# Patient Record
Sex: Male | Born: 1965 | Race: White | Hispanic: No | Marital: Married | Smoking: Former smoker
Health system: Southern US, Community
[De-identification: ages and names within clinical notes are randomized; demographics above are authoritative.]

## PROBLEM LIST (undated history)

## (undated) DIAGNOSIS — K920 Hematemesis: Secondary | ICD-10-CM

## (undated) DIAGNOSIS — K219 Gastro-esophageal reflux disease without esophagitis: Secondary | ICD-10-CM

## (undated) DIAGNOSIS — R51 Headache: Secondary | ICD-10-CM

## (undated) DIAGNOSIS — G610 Guillain-Barre syndrome: Secondary | ICD-10-CM

## (undated) DIAGNOSIS — I509 Heart failure, unspecified: Secondary | ICD-10-CM

## (undated) DIAGNOSIS — Z95810 Presence of automatic (implantable) cardiac defibrillator: Secondary | ICD-10-CM

## (undated) DIAGNOSIS — K311 Adult hypertrophic pyloric stenosis: Secondary | ICD-10-CM

## (undated) DIAGNOSIS — K259 Gastric ulcer, unspecified as acute or chronic, without hemorrhage or perforation: Secondary | ICD-10-CM

## (undated) DIAGNOSIS — Z87442 Personal history of urinary calculi: Secondary | ICD-10-CM

## (undated) DIAGNOSIS — F419 Anxiety disorder, unspecified: Secondary | ICD-10-CM

## (undated) DIAGNOSIS — K922 Gastrointestinal hemorrhage, unspecified: Secondary | ICD-10-CM

## (undated) HISTORY — DX: Hematemesis: K92.0

## (undated) HISTORY — DX: Adult hypertrophic pyloric stenosis: K31.1

## (undated) HISTORY — PX: OTHER SURGICAL HISTORY: SHX169

## (undated) HISTORY — PX: TONSILLECTOMY: SUR1361

## (undated) HISTORY — DX: Gastrointestinal hemorrhage, unspecified: K92.2

---
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
## 2010-07-04 ENCOUNTER — Emergency Department (HOSPITAL_COMMUNITY)
Admission: EM | Admit: 2010-07-04 | Discharge: 2010-07-04 | Disposition: A | Discharge status: Home / Self Care | Payer: BC Managed Care – PPO | Attending: Emergency Medicine | Admitting: Emergency Medicine

## 2010-07-04 ENCOUNTER — Emergency Department (HOSPITAL_COMMUNITY): Payer: BC Managed Care – PPO

## 2010-07-04 ENCOUNTER — Other Ambulatory Visit: Payer: Self-pay | Admitting: Family Medicine

## 2010-07-04 ENCOUNTER — Ambulatory Visit
Admission: RE | Admit: 2010-07-04 | Discharge: 2010-07-04 | Disposition: A | Discharge status: Home / Self Care | Payer: BC Managed Care – PPO | Source: Ambulatory Visit | Attending: Family Medicine | Admitting: Family Medicine

## 2010-07-04 DIAGNOSIS — R51 Headache: Secondary | ICD-10-CM

## 2010-07-04 DIAGNOSIS — G44209 Tension-type headache, unspecified, not intractable: Secondary | ICD-10-CM | POA: Insufficient documentation

## 2010-07-04 DIAGNOSIS — R110 Nausea: Secondary | ICD-10-CM | POA: Insufficient documentation

## 2010-07-04 LAB — COMPREHENSIVE METABOLIC PANEL
AST: 21 U/L (ref 0–37)
Albumin: 3.9 g/dL (ref 3.5–5.2)
Chloride: 104 mEq/L (ref 96–112)
Creatinine, Ser: 0.79 mg/dL (ref 0.4–1.5)
GFR calc Af Amer: >60 mL/min (ref >60)
Potassium: 3.8 mEq/L (ref 3.5–5.1)
Sodium: 140 mEq/L (ref 135–145)
Total Bilirubin: 0.1 mg/dL — ABNORMAL LOW (ref 0.3–1.2)

## 2010-07-04 LAB — URINALYSIS, ROUTINE W REFLEX MICROSCOPIC
Glucose, UA: NEGATIVE mg/dL
Protein, ur: NEGATIVE mg/dL
Specific Gravity, Urine: 1.031 — ABNORMAL HIGH (ref 1.005–1.030)
pH: 5.5 (ref 5.0–8.0)

## 2010-07-04 LAB — CBC
HCT: 46.1 % (ref 39.0–52.0)
Hemoglobin: 15.8 g/dL (ref 13.0–17.0)
MCH: 32.8 pg (ref 26.0–34.0)
MCHC: 34.3 g/dL (ref 30.0–36.0)

## 2010-07-04 LAB — DIFFERENTIAL
Lymphocytes Relative: 8 % — ABNORMAL LOW (ref 12–46)
Monocytes Absolute: 0.6 10*3/uL (ref 0.1–1.0)
Monocytes Relative: 5 % (ref 3–12)
Neutro Abs: 10.5 10*3/uL — ABNORMAL HIGH (ref 1.7–7.7)

## 2010-07-04 MED ORDER — GADOBENATE DIMEGLUMINE 529 MG/ML IV SOLN: 17.0000 mL | Freq: Once | INTRAVENOUS | Status: DC | PRN

## 2012-09-11 ENCOUNTER — Ambulatory Visit (INDEPENDENT_AMBULATORY_CARE_PROVIDER_SITE_OTHER): Payer: BC Managed Care – PPO | Admitting: Family Medicine

## 2012-09-11 ENCOUNTER — Ambulatory Visit: Payer: BC Managed Care – PPO

## 2012-09-11 VITALS — BP 140/80 | HR 110 | Temp 98.3°F | Resp 16 | Ht 71.0 in | Wt 169.6 lb

## 2012-09-11 DIAGNOSIS — R319 Hematuria, unspecified: Secondary | ICD-10-CM

## 2012-09-11 DIAGNOSIS — M545 Low back pain: Secondary | ICD-10-CM

## 2012-09-11 LAB — POCT URINALYSIS DIPSTICK
Protein, UA: NEGATIVE
Spec Grav, UA: 1.02
Urobilinogen, UA: 0.2

## 2012-09-11 LAB — POCT UA - MICROSCOPIC ONLY
Casts, Ur, LPF, POC: NEGATIVE
Crystals, Ur, HPF, POC: NEGATIVE

## 2012-09-11 LAB — POCT CBC
Granulocyte percent: 83.7 %G — AB (ref 37–80)
Hemoglobin: 15.2 g/dL (ref 14.1–18.1)
MPV: 8.4 fL (ref 0–99.8)
POC Granulocyte: 12.6 — AB (ref 2–6.9)
POC MID %: 83.7 %M — AB (ref 0–12)
RBC: 4.66 M/uL — AB (ref 4.69–6.13)

## 2012-09-11 MED ORDER — KETOROLAC TROMETHAMINE 60 MG/2ML IM SOLN
60.0000 mg | Freq: Once | INTRAMUSCULAR | Status: AC
  Administered 2012-09-11: 60 mg via INTRAMUSCULAR

## 2012-09-11 MED ORDER — HYDROCODONE-ACETAMINOPHEN 5-325 MG PO TABS: 1.0000 | ORAL_TABLET | Freq: Four times a day (QID) | ORAL | Status: DC | PRN

## 2012-09-11 MED ORDER — IBUPROFEN 800 MG PO TABS: 800.0000 mg | ORAL_TABLET | Freq: Three times a day (TID) | ORAL | Status: DC | PRN

## 2012-09-11 MED ORDER — TAMSULOSIN HCL 0.4 MG PO CAPS: 0.4000 mg | ORAL_CAPSULE | Freq: Every day | ORAL | Status: DC

## 2012-09-11 NOTE — Patient Instructions (Addendum)

## 2012-09-11 NOTE — Progress Notes (Signed)
[address]   [place], [place]  [address]   [phone]  Subjective:    [name], male    DOB: 1966/01/07, 47 y.o.   MRN: [id number]  HPI This 47 y.o. male presents for evaluation of L lower back pain.  Onset of vomiting two days prior; felt better but then L lower back pain developed; pain has moved to L side.  Today pain is quite severe and unable to concentrate.  Pain comes in waves. Low end 4-9/10.  Intermittent pain.  With urination, felt relief and then pain exacerbated.  No dysuria; decreased UOP.  No straining with urination; urine stream is slow.  No hematuria.  No kidney stone.  Wife is nurse; wife has kidney stones.  No fever; working all week. Did look pale today when got home today.  Also having abdominal cramping; no bowel movement yesterday; wife gave him Mag Citrate and Gas-X; stomach pain improved.  Then developed L sided pain.  No vomiting in four days.  +nausea intermittent.  Can't get comfortable no matter what position.  Has taken Ibuprofen this afternoon. Appetite decreased.  Transport planner.    PCP:  [facility]   Review of Systems  Constitutional: Negative for fever, chills, diaphoresis and fatigue.  Gastrointestinal: Positive for nausea, vomiting and abdominal pain. Negative for diarrhea and constipation.  Genitourinary: Positive for flank pain and decreased urine volume. Negative for dysuria, urgency, frequency, hematuria, discharge, penile swelling, scrotal swelling, genital sores, penile pain and testicular pain.  Musculoskeletal: Positive for back pain.  Skin: Negative for rash.  Neurological: Negative for weakness and numbness.    History reviewed. No pertinent past medical history.  History reviewed. No pertinent past surgical history.  Prior to Admission medications   Medication Sig Start Date End Date Taking? Authorizing Provider  aspirin 325 MG tablet Take 325 mg by mouth daily.   Yes Historical Provider, MD  omeprazole (PRILOSEC) 10 MG  capsule Take 10 mg by mouth daily.   Yes Historical Provider, MD  HYDROcodone-acetaminophen (NORCO/VICODIN) 5-325 MG per tablet Take 1 tablet by mouth every 6 (six) hours as needed for pain. 09/11/12   Ethelda Chick, MD  tamsulosin (FLOMAX) 0.4 MG CAPS Take 1 capsule (0.4 mg total) by mouth daily. 09/11/12   Ethelda Chick, MD    No Known Allergies  History   Social History  . Marital Status: Single    Spouse Name: N/A    Number of Children: N/A  . Years of Education: N/A   Occupational History  . Not on file.   Social History Main Topics  . Smoking status: Current Every Day Smoker  . Smokeless tobacco: Not on file  . Alcohol Use: Not on file  . Drug Use: Not on file  . Sexually Active: Not on file   Other Topics Concern  . Not on file   Social History Narrative  . No narrative on file    No family history on file.     Objective:   Physical Exam  Nursing note and vitals reviewed. Constitutional: He is oriented to person, place, and time. He appears well-developed and well-nourished. He appears distressed.  Secondary to pain; pain resolved during exam.  HENT:  Head: Normocephalic and atraumatic.  Neck: Normal range of motion. Neck supple.  Cardiovascular: Normal rate, regular rhythm and normal heart sounds.   Pulmonary/Chest: Effort normal and breath sounds normal.  Abdominal: Soft. Bowel sounds are normal. He exhibits no distension and no mass.  There is no tenderness. There is no rebound and no guarding.  Neurological: He is alert and oriented to person, place, and time.  Skin: Skin is warm and dry. No rash noted. He is not diaphoretic.  Psychiatric: He has a normal mood and affect. His behavior is normal.      Results for orders placed in visit on 09/11/12  POCT UA - MICROSCOPIC ONLY      Result Value Range   WBC, Ur, HPF, POC 0-3     RBC, urine, microscopic 3-8     Bacteria, U Microscopic trace     Mucus, UA trace     Epithelial cells, urine per micros 0-1      Crystals, Ur, HPF, POC neg     Casts, Ur, LPF, POC neg     Yeast, UA neg    POCT URINALYSIS DIPSTICK      Result Value Range   Color, UA yellow     Clarity, UA clear     Glucose, UA neg     Bilirubin, UA neg     Ketones, UA 40     Spec Grav, UA 1.020     Blood, UA mod     pH, UA 5.5     Protein, UA neg     Urobilinogen, UA 0.2     Nitrite, UA neg     Leukocytes, UA Negative     TORADOL 60MG  IM ADMINISTERED IN OFFICE BY RODIE.  [facility] reading (PRIMARY) by  Dr. Katrinka Blazing.  KUB:  +calcifications R abdomen; ? Calcification L kidney     Assessment & Plan:  Low back pain - Plan: POCT UA - Microscopic Only, POCT urinalysis dipstick, ketorolac (TORADOL) injection 60 mg  Hematuria - Plan: POCT CBC, Comprehensive metabolic panel, Urine culture, DG Abd 1 View  1.  Low back pain/Flank pain:  New. Associated with hematuria; concern for calcified stone on KUB: treat for nephrolithiasis; s/p Toradol injection in office. Rx for Ibuprofen 800mg  tid with food; rx for hydrocodone provided. 2. Hematuria:  New. Associated with severe colicky back pain/flank pain. Treat for acute nephrolithiasis with Ibuprofen 800mg , Hydrocodone, Flomax. Refer for CT abd/pelvis in am to evaluate location of stone.  Aggressive hydration emphasized. 3. Likely acute nephrolithiasis:  New.  Strainer provided; rx for Ibuprofen, Hydrocodone, Flomax.  Obtain CT.  Meds ordered this encounter  Medications  . aspirin 325 MG tablet    Sig: Take 325 mg by mouth daily.  Marland Kitchen omeprazole (PRILOSEC) 10 MG capsule    Sig: Take 10 mg by mouth daily.  Marland Kitchen ketorolac (TORADOL) injection 60 mg    Sig:   . tamsulosin (FLOMAX) 0.4 MG CAPS    Sig: Take 1 capsule (0.4 mg total) by mouth daily.    Dispense:  30 capsule    Refill:  0  . HYDROcodone-acetaminophen (NORCO/VICODIN) 5-325 MG per tablet    Sig: Take 1 tablet by mouth every 6 (six) hours as needed for pain.    Dispense:  40 tablet    Refill:  0

## 2012-09-12 ENCOUNTER — Ambulatory Visit (HOSPITAL_COMMUNITY)
Admission: RE | Admit: 2012-09-12 | Discharge: 2012-09-12 | Disposition: A | Discharge status: Home / Self Care | Payer: BC Managed Care – PPO | Source: Ambulatory Visit | Attending: Family Medicine | Admitting: Family Medicine

## 2012-09-12 ENCOUNTER — Telehealth: Payer: Self-pay | Admitting: Radiology

## 2012-09-12 DIAGNOSIS — R112 Nausea with vomiting, unspecified: Secondary | ICD-10-CM | POA: Insufficient documentation

## 2012-09-12 DIAGNOSIS — N201 Calculus of ureter: Secondary | ICD-10-CM | POA: Insufficient documentation

## 2012-09-12 DIAGNOSIS — N200 Calculus of kidney: Secondary | ICD-10-CM

## 2012-09-12 DIAGNOSIS — R319 Hematuria, unspecified: Secondary | ICD-10-CM | POA: Insufficient documentation

## 2012-09-12 DIAGNOSIS — R109 Unspecified abdominal pain: Secondary | ICD-10-CM | POA: Insufficient documentation

## 2012-09-12 DIAGNOSIS — N1330 Unspecified hydronephrosis: Secondary | ICD-10-CM | POA: Insufficient documentation

## 2012-09-12 LAB — COMPREHENSIVE METABOLIC PANEL
ALT: 13 U/L (ref 0–53)
CO2: 23 mEq/L (ref 19–32)
Calcium: 9.6 mg/dL (ref 8.4–10.5)
Chloride: 101 mEq/L (ref 96–112)
Sodium: 138 mEq/L (ref 135–145)
Total Protein: 7 g/dL (ref 6.0–8.3)

## 2012-09-12 NOTE — Telephone Encounter (Signed)
Records have been sent to [facility], the triage nurse will call me back with an appt.

## 2012-09-12 NOTE — Telephone Encounter (Signed)
Noted  

## 2012-09-12 NOTE — Telephone Encounter (Signed)
Patient advised, we will be in touch with him with appt for Urology.

## 2012-09-12 NOTE — Telephone Encounter (Signed)
Patient has a kidney stone demonstrated on the CT scan, please advise.

## 2012-09-12 NOTE — Telephone Encounter (Signed)
Discussed with Dr Katrinka Blazing Urology referral made STAT

## 2012-09-12 NOTE — Telephone Encounter (Signed)
[facility] called to report that they fit pt in for appt today and they have already contacted pt.

## 2012-09-13 LAB — URINE CULTURE: Colony Count: NO GROWTH

## 2012-09-15 ENCOUNTER — Other Ambulatory Visit: Payer: Self-pay | Admitting: Urology

## 2012-09-16 ENCOUNTER — Encounter (HOSPITAL_COMMUNITY): Payer: Self-pay | Admitting: Pharmacy Technician

## 2012-09-19 ENCOUNTER — Encounter (HOSPITAL_COMMUNITY): Payer: Self-pay

## 2012-09-19 ENCOUNTER — Ambulatory Visit (HOSPITAL_BASED_OUTPATIENT_CLINIC_OR_DEPARTMENT_OTHER): Admit: 2012-09-19 | Payer: Self-pay | Admitting: Urology

## 2012-09-19 ENCOUNTER — Encounter (HOSPITAL_BASED_OUTPATIENT_CLINIC_OR_DEPARTMENT_OTHER): Payer: Self-pay

## 2012-09-19 ENCOUNTER — Encounter (HOSPITAL_COMMUNITY)
Admission: RE | Admit: 2012-09-19 | Discharge: 2012-09-19 | Disposition: A | Discharge status: Home / Self Care | Payer: BC Managed Care – PPO | Source: Ambulatory Visit | Attending: Urology | Admitting: Urology

## 2012-09-19 DIAGNOSIS — Z87442 Personal history of urinary calculi: Secondary | ICD-10-CM

## 2012-09-19 HISTORY — DX: Anxiety disorder, unspecified: F41.9

## 2012-09-19 HISTORY — DX: Gastro-esophageal reflux disease without esophagitis: K21.9

## 2012-09-19 HISTORY — DX: Personal history of urinary calculi: Z87.442

## 2012-09-19 HISTORY — DX: Headache: R51

## 2012-09-19 LAB — CBC
Hemoglobin: 14.9 g/dL (ref 13.0–17.0)
MCH: 33 pg (ref 26.0–34.0)
MCHC: 34.7 g/dL (ref 30.0–36.0)
Platelets: 427 10*3/uL — ABNORMAL HIGH (ref 150–400)

## 2012-09-19 SURGERY — CYSTOSCOPY/RETROGRADE/URETEROSCOPY
Anesthesia: General | Laterality: Left

## 2012-09-19 NOTE — Patient Instructions (Signed)
YOUR SURGERY IS SCHEDULED AT [facility]  ON:  Wednesday  8/6  REPORT TO [place] [facility] AT:  6:30 AM      PHONE # FOR SHORT STAY IS [phone]  DO NOT EAT OR DRINK ANYTHING AFTER MIDNIGHT THE NIGHT BEFORE YOUR SURGERY.  YOU MAY BRUSH YOUR TEETH, RINSE OUT YOUR MOUTH--BUT NO WATER, NO FOOD, NO CHEWING GUM, NO MINTS, NO CANDIES, NO CHEWING TOBACCO.  PLEASE TAKE THE FOLLOWING MEDICATIONS THE AM OF YOUR SURGERY WITH A FEW SIPS OF WATER: PRILOSEC, TAMSULOSIN    DO NOT BRING VALUABLES, MONEY, CREDIT CARDS.  DO NOT WEAR JEWELRY, MAKE-UP, NAIL POLISH AND NO METAL PINS OR CLIPS IN YOUR HAIR. CONTACT LENS, DENTURES / PARTIALS, GLASSES SHOULD NOT BE WORN TO SURGERY AND IN MOST CASES-HEARING AIDS WILL NEED TO BE REMOVED.  BRING YOUR GLASSES CASE, ANY EQUIPMENT NEEDED FOR YOUR CONTACT LENS. FOR PATIENTS ADMITTED TO THE HOSPITAL--CHECK OUT TIME THE DAY OF DISCHARGE IS 11:00 AM.  ALL INPATIENT ROOMS ARE PRIVATE - WITH BATHROOM, TELEPHONE, TELEVISION AND WIFI INTERNET.  IF YOU ARE BEING DISCHARGED THE SAME DAY OF YOUR SURGERY--YOU CAN NOT DRIVE YOURSELF HOME--AND SHOULD NOT GO HOME ALONE BY TAXI OR BUS.  NO DRIVING OR OPERATING MACHINERY FOR 24 HOURS FOLLOWING ANESTHESIA / PAIN MEDICATIONS.  PLEASE MAKE ARRANGEMENTS FOR SOMEONE TO BE WITH YOU AT HOME THE FIRST 24 HOURS AFTER SURGERY. RESPONSIBLE DRIVER'S NAME                                                CHRISTINE ROGERS  - WILL BE WITH PT DAY OF SURGERY                             PATIENT SIGNATURE_________________________________

## 2012-09-19 NOTE — Pre-Procedure Instructions (Signed)
EKG AND CXR NOT NEEDED PREOP - PER ANESTHESIOLOGIST'S GUIDELINES. 

## 2012-09-23 MED ORDER — GENTAMICIN SULFATE 40 MG/ML IJ SOLN
370.0000 mg | INTRAVENOUS | Status: AC
  Administered 2012-09-24: 370 mg via INTRAVENOUS
  Filled 2012-09-23: qty 9.25

## 2012-09-23 MED ORDER — GENTAMICIN SULFATE 40 MG/ML IJ SOLN
370.0000 mg | INTRAVENOUS | Status: DC
  Filled 2012-09-23: qty 9.25

## 2012-09-23 NOTE — Anesthesia Preprocedure Evaluation (Addendum)
Anesthesia Evaluation  Patient identified by MRN, date of birth, ID band Patient awake    Reviewed: Allergy & Precautions, H&P , NPO status , Patient's Chart, lab work & pertinent test results  Airway Mallampati: II TM Distance: >3 FB Neck ROM: full    Dental no notable dental hx. (+) Teeth Intact and Dental Advisory Given   Pulmonary neg pulmonary ROS,  breath sounds clear to auscultation  Pulmonary exam normal       Cardiovascular Exercise Tolerance: Good negative cardio ROS  Rhythm:regular Rate:Normal     Neuro/Psych negative neurological ROS  negative psych ROS   GI/Hepatic negative GI ROS, Neg liver ROS, GERD-  Medicated and Controlled,  Endo/Other  negative endocrine ROS  Renal/GU negative Renal ROS  negative genitourinary   Musculoskeletal   Abdominal   Peds  Hematology negative hematology ROS (+)   Anesthesia Other Findings   Reproductive/Obstetrics negative OB ROS                           Anesthesia Physical Anesthesia Plan  ASA: II  Anesthesia Plan: General   Post-op Pain Management:    Induction: Intravenous  Airway Management Planned: LMA  Additional Equipment:   Intra-op Plan:   Post-operative Plan:   Informed Consent: I have reviewed the patients History and Physical, chart, labs and discussed the procedure including the risks, benefits and alternatives for the proposed anesthesia with the patient or authorized representative who has indicated his/her understanding and acceptance.   Dental Advisory Given  Plan Discussed with: CRNA and Surgeon  Anesthesia Plan Comments:         Anesthesia Quick Evaluation  

## 2012-09-24 ENCOUNTER — Encounter (HOSPITAL_COMMUNITY)
Admission: RE | Disposition: A | Discharge status: Home / Self Care | Payer: Self-pay | Source: Ambulatory Visit | Attending: Urology

## 2012-09-24 ENCOUNTER — Ambulatory Visit (HOSPITAL_COMMUNITY): Payer: BC Managed Care – PPO | Admitting: Anesthesiology

## 2012-09-24 ENCOUNTER — Encounter (HOSPITAL_COMMUNITY): Payer: Self-pay | Admitting: Anesthesiology

## 2012-09-24 ENCOUNTER — Encounter (HOSPITAL_COMMUNITY): Payer: Self-pay | Admitting: *Deleted

## 2012-09-24 ENCOUNTER — Ambulatory Visit (HOSPITAL_COMMUNITY)
Admission: RE | Admit: 2012-09-24 | Discharge: 2012-09-24 | Disposition: A | Discharge status: Home / Self Care | Payer: BC Managed Care – PPO | Source: Ambulatory Visit | Attending: Urology | Admitting: Urology

## 2012-09-24 ENCOUNTER — Other Ambulatory Visit: Payer: Self-pay | Admitting: Urology

## 2012-09-24 DIAGNOSIS — Z87442 Personal history of urinary calculi: Secondary | ICD-10-CM | POA: Insufficient documentation

## 2012-09-24 DIAGNOSIS — K219 Gastro-esophageal reflux disease without esophagitis: Secondary | ICD-10-CM | POA: Insufficient documentation

## 2012-09-24 DIAGNOSIS — N201 Calculus of ureter: Secondary | ICD-10-CM | POA: Insufficient documentation

## 2012-09-24 DIAGNOSIS — Z7982 Long term (current) use of aspirin: Secondary | ICD-10-CM | POA: Insufficient documentation

## 2012-09-24 DIAGNOSIS — Z79899 Other long term (current) drug therapy: Secondary | ICD-10-CM | POA: Insufficient documentation

## 2012-09-24 HISTORY — PX: CYSTOSCOPY WITH RETROGRADE PYELOGRAM, URETEROSCOPY AND STENT PLACEMENT: SHX5789

## 2012-09-24 HISTORY — PX: OTHER SURGICAL HISTORY: SHX169

## 2012-09-24 SURGERY — CYSTOURETEROSCOPY, WITH RETROGRADE PYELOGRAM AND STENT INSERTION
Anesthesia: General | Wound class: Clean Contaminated

## 2012-09-24 MED ORDER — SODIUM CHLORIDE 0.9 % IV SOLN
INTRAVENOUS | Status: DC | PRN
  Administered 2012-09-24: 09:00:00

## 2012-09-24 MED ORDER — FENTANYL CITRATE 0.05 MG/ML IJ SOLN: 50.0000 ug | INTRAMUSCULAR | Status: DC | PRN

## 2012-09-24 MED ORDER — FENTANYL CITRATE 0.05 MG/ML IJ SOLN: 25.0000 ug | INTRAMUSCULAR | Status: DC | PRN

## 2012-09-24 MED ORDER — PROPOFOL 10 MG/ML IV BOLUS
INTRAVENOUS | Status: DC | PRN
  Administered 2012-09-24: 200 mg via INTRAVENOUS

## 2012-09-24 MED ORDER — SENNOSIDES-DOCUSATE SODIUM 8.6-50 MG PO TABS: 1.0000 | ORAL_TABLET | Freq: Two times a day (BID) | ORAL | Status: DC

## 2012-09-24 MED ORDER — FENTANYL CITRATE 0.05 MG/ML IJ SOLN
INTRAMUSCULAR | Status: AC
  Filled 2012-09-24: qty 2

## 2012-09-24 MED ORDER — KETOROLAC TROMETHAMINE 30 MG/ML IJ SOLN
INTRAMUSCULAR | Status: DC | PRN
  Administered 2012-09-24: 30 mg via INTRAVENOUS

## 2012-09-24 MED ORDER — MIDAZOLAM HCL 5 MG/5ML IJ SOLN
INTRAMUSCULAR | Status: DC | PRN
  Administered 2012-09-24: 2 mg via INTRAVENOUS

## 2012-09-24 MED ORDER — IOHEXOL 300 MG/ML  SOLN
INTRAMUSCULAR | Status: AC
  Filled 2012-09-24: qty 1

## 2012-09-24 MED ORDER — OXYBUTYNIN CHLORIDE 5 MG PO TABS: 5.0000 mg | ORAL_TABLET | Freq: Three times a day (TID) | ORAL | Status: DC | PRN

## 2012-09-24 MED ORDER — OXYBUTYNIN CHLORIDE 5 MG PO TABS
5.0000 mg | ORAL_TABLET | Freq: Three times a day (TID) | ORAL | Status: DC
  Administered 2012-09-24: 5 mg via ORAL
  Filled 2012-09-24: qty 1

## 2012-09-24 MED ORDER — ONDANSETRON HCL 4 MG/2ML IJ SOLN
INTRAMUSCULAR | Status: DC | PRN
  Administered 2012-09-24: 4 mg via INTRAVENOUS

## 2012-09-24 MED ORDER — TAMSULOSIN HCL 0.4 MG PO CAPS: 0.4000 mg | ORAL_CAPSULE | Freq: Every day | ORAL | Status: DC

## 2012-09-24 MED ORDER — LACTATED RINGERS IV SOLN: INTRAVENOUS | Status: DC

## 2012-09-24 MED ORDER — HYDROCODONE-ACETAMINOPHEN 5-325 MG PO TABS: 1.0000 | ORAL_TABLET | Freq: Four times a day (QID) | ORAL | Status: DC | PRN

## 2012-09-24 MED ORDER — FENTANYL CITRATE 0.05 MG/ML IJ SOLN
INTRAMUSCULAR | Status: DC | PRN
  Administered 2012-09-24 (×2): 25 ug via INTRAVENOUS

## 2012-09-24 MED ORDER — SODIUM CHLORIDE 0.9 % IR SOLN
Status: DC | PRN
  Administered 2012-09-24: 4000 mL

## 2012-09-24 MED ORDER — GLYCOPYRROLATE 0.2 MG/ML IJ SOLN
INTRAMUSCULAR | Status: DC | PRN
  Administered 2012-09-24: 0.2 mg via INTRAVENOUS

## 2012-09-24 MED ORDER — LIDOCAINE HCL (CARDIAC) 20 MG/ML IV SOLN
INTRAVENOUS | Status: DC | PRN
  Administered 2012-09-24: 80 mg via INTRAVENOUS

## 2012-09-24 MED ORDER — FENTANYL CITRATE 0.05 MG/ML IJ SOLN
25.0000 ug | INTRAMUSCULAR | Status: DC | PRN
  Administered 2012-09-24 (×5): 50 ug via INTRAVENOUS

## 2012-09-24 MED ORDER — INDIGOTINDISULFONATE SODIUM 8 MG/ML IJ SOLN
INTRAMUSCULAR | Status: AC
  Filled 2012-09-24: qty 5

## 2012-09-24 MED ORDER — LACTATED RINGERS IV SOLN
INTRAVENOUS | Status: DC | PRN
  Administered 2012-09-24 (×2): via INTRAVENOUS

## 2012-09-24 MED ORDER — PHENYLEPHRINE HCL 10 MG/ML IJ SOLN
INTRAMUSCULAR | Status: DC | PRN
  Administered 2012-09-24 (×2): 80 ug via INTRAVENOUS

## 2012-09-24 SURGICAL SUPPLY — 20 items
BAG URINE DRAINAGE (UROLOGICAL SUPPLIES) ×3 IMPLANT
BASKET LASER NITINOL 1.9FR (BASKET) IMPLANT
BASKET STNLS GEMINI 4WIRE 3FR (BASKET) IMPLANT
BASKET ZERO TIP NITINOL 2.4FR (BASKET) IMPLANT
CATH INTERMIT  6FR 70CM (CATHETERS) ×3 IMPLANT
DRAPE CAMERA CLOSED 9X96 (DRAPES) ×3 IMPLANT
ELECT REM PT RETURN 9FT ADLT (ELECTROSURGICAL)
ELECTRODE REM PT RTRN 9FT ADLT (ELECTROSURGICAL) IMPLANT
GLOVE BIOGEL M STRL SZ7.5 (GLOVE) ×3 IMPLANT
GOWN PREVENTION PLUS LG XLONG (DISPOSABLE) ×3 IMPLANT
GUIDEWIRE ANG ZIPWIRE 038X150 (WIRE) ×3 IMPLANT
GUIDEWIRE STR DUAL SENSOR (WIRE) ×3 IMPLANT
IV NS IRRIG 3000ML ARTHROMATIC (IV SOLUTION) ×3 IMPLANT
LASER FIBER DISP (UROLOGICAL SUPPLIES) IMPLANT
PACK CYSTO (CUSTOM PROCEDURE TRAY) ×3 IMPLANT
SHEATH ACCESS URETERAL 38CM (SHEATH) ×3 IMPLANT
STENT CONTOUR 6FRX26X.038 (STENTS) ×3 IMPLANT
SYRINGE 10CC LL (SYRINGE) IMPLANT
SYRINGE IRR TOOMEY STRL 70CC (SYRINGE) IMPLANT
TUBE FEEDING 8FR 16IN STR KANG (MISCELLANEOUS) ×3 IMPLANT

## 2012-09-24 NOTE — Brief Op Note (Signed)
09/24/2012  9:21 AM  PATIENT:  Austin [name]  47 y.o. male  PRE-OPERATIVE DIAGNOSIS:  LEFT URETERAL STONE   POST-OPERATIVE DIAGNOSIS:  LEFT URETERAL STONE   PROCEDURE:  Procedure(s): CYSTOSCOPY WITH RETROGRADE PYELOGRAM, DIAGNOSTIC URETEROSCOPY AND STENT PLACEMENT (Left)  SURGEON:  Surgeon(s) and Role:    * Sebastian Ache, MD - Primary  PHYSICIAN ASSISTANT:   ASSISTANTS: none   ANESTHESIA:   general  EBL:  Total I/O In: 1000 [I.V.:1000] Out: -   BLOOD ADMINISTERED:none  DRAINS: none   LOCAL MEDICATIONS USED:  NONE  SPECIMEN:  No Specimen  DISPOSITION OF SPECIMEN:  N/A  COUNTS:  YES  TOURNIQUET:  * No tourniquets in log *  DICTATION: .Other Dictation: Dictation Number [id number]  PLAN OF CARE: Discharge to home after PACU  PATIENT DISPOSITION:  PACU - hemodynamically stable.   Delay start of Pharmacological VTE agent (>24hrs) due to surgical blood loss or risk of bleeding: not applicable

## 2012-09-24 NOTE — Anesthesia Postprocedure Evaluation (Signed)
  Anesthesia Post-op Note  Patient: Austin [name]  Procedure(s) Performed: Procedure(s) (LRB): CYSTOSCOPY WITH RETROGRADE PYELOGRAM, DIAGNOSTIC URETEROSCOPY AND STENT PLACEMENT (Left)  Patient Location: PACU  Anesthesia Type: General  Level of Consciousness: awake and alert   Airway and Oxygen Therapy: Patient Spontanous Breathing  Post-op Pain: mild  Post-op Assessment: Post-op Vital signs reviewed, Patient's Cardiovascular Status Stable, Respiratory Function Stable, Patent Airway and No signs of Nausea or vomiting  Last Vitals:  Filed Vitals:   09/24/12 1000  BP: 131/91  Pulse: 69  Temp:   Resp: 13    Post-op Vital Signs: stable   Complications: No apparent anesthesia complications

## 2012-09-24 NOTE — H&P (Signed)
Austin [name] is an 47 y.o. male.    Chief Complaint: Pre-Op Left Ureteroscopic Stone Manipulation  HPI:   1 - Left Ureteral Stone - Pt with first episode renal colic with left 6mm UPJ stone by CT 09/11/12, SSD 10cm, 600HU, no additional stones. Presently managing wtih well with hydrocodone and tamsulosin. Not easily visible on KUB.  PMH sig for [employer] after viral syndrome 20 years ago. No CV disease. No strong blood thinners.  Today [name] is seen to proceed with left ureteroscopic stone manipulation. No interval fevers or stone passage.  Past Medical History  Diagnosis Date  . Anxiety   . History of kidney stones 09/19/12    left ureteral stone  . GERD (gastroesophageal reflux disease)   . Headache(784.0)     hx of migraines-none recent    Past Surgical History  Procedure Laterality Date  . Tonsillectomy      t&a as a child  . Wisdom teeth extracted      History reviewed. No pertinent family history. Social History:  reports that he has been smoking Cigarettes.  He has a 28 pack-year smoking history. He has never used smokeless tobacco. He reports that he uses illicit drugs (Marijuana). He reports that he does not drink alcohol.  Allergies: No Known Allergies  Medications Prior to Admission  Medication Sig Dispense Refill  . HYDROcodone-acetaminophen (NORCO/VICODIN) 5-325 MG per tablet Take 1 tablet by mouth every 6 (six) hours as needed for pain.  40 tablet  0  . omeprazole (PRILOSEC) 20 MG capsule Take 20 mg by mouth daily.      . tamsulosin (FLOMAX) 0.4 MG CAPS Take 1 capsule (0.4 mg total) by mouth daily.  30 capsule  0  . aspirin 325 MG tablet Take 325 mg by mouth every 6 (six) hours as needed for pain.       Marland Kitchen ibuprofen (ADVIL,MOTRIN) 800 MG tablet Take 1 tablet (800 mg total) by mouth every 8 (eight) hours as needed for pain.  30 tablet  0    No results found for this or any previous visit (from the past 48 hour(s)). No results found.  Review of Systems   Constitutional: Negative for fever and chills.  HENT: Negative.   Eyes: Negative.   Respiratory: Negative.   Cardiovascular: Negative.   Gastrointestinal: Negative.   Genitourinary: Positive for flank pain.  Musculoskeletal: Negative.   Skin: Negative.   Neurological: Negative.   Endo/Heme/Allergies: Negative.   Psychiatric/Behavioral: Negative.     Blood pressure 126/84, pulse 91, temperature 97.5 F (36.4 C), temperature source Oral, resp. rate 18, SpO2 100.00%. Physical Exam  Constitutional: He is oriented to person, place, and time. He appears well-developed and well-nourished.  HENT:  Head: Normocephalic and atraumatic.  Eyes: EOM are normal.  Neck: Normal range of motion. Neck supple.  Cardiovascular: Normal rate and regular rhythm.   Respiratory: Effort normal and breath sounds normal.  GI: Soft. Bowel sounds are normal.  Genitourinary: Penis normal.  Mild left CVAT  Musculoskeletal: Normal range of motion.  Neurological: He is alert and oriented to person, place, and time.  Skin: Skin is warm and dry.  Psychiatric: He has a normal mood and affect. His behavior is normal. Judgment and thought content normal.     Assessment/Plan   1 - Left Ureteral Stone -  We ewdiscussed ureteroscopic stone manipulation with basketing and laser-lithotripsy in detail.  Weew discussed risks including bleeding, infection, damage to kidney / ureter  bladder, rarely loss of  kidney. We ewdiscussed anesthetic risks and rare but serious surgical complications including DVT, PE, MI, and mortality. We specifically ewaddressed that in 5-10% of cases a staged approach is required with stenting followed by re-attempt ureteroscopy if anatomy unfavorable. The patient voiced understanding and wises to proceed today as scheduled.     Issabella Rix 09/24/2012, 7:25 AM

## 2012-09-24 NOTE — Op Note (Signed)
NAME:  Austin [name], Austin [name] NO.:  [id number]  MEDICAL RECORD NO.:  [id number]  LOCATION:  [facility]                         FACILITY:  [facility]  PHYSICIAN:  Sebastian Ache, MD     DATE OF BIRTH:  [birth year]  DATE OF PROCEDURE: 09/24/2012 DATE OF DISCHARGE:                              OPERATIVE REPORT   DIAGNOSES:  Left proximal ureteral stone and flank pain.  PROCEDURE: 1. Cystoscopy with left retrograde pyelogram interpretation. 2. Insertion of left ureteral stent 6 x 26, no tether. 3. Left diagnostic ureteroscopy.  ESTIMATED BLOOD LOSS:  Nil.  COMPLICATIONS:  None.  SPECIMEN:  None.  FINDINGS: 1. Very narrow left mid ureter, unable to accommodate even 6-French     flexible ureteroscope. 2. Filling defect at the left UPJ consistent with known stone.  INDICATION:  Mr. Corpuz is a very pleasant 46 year old gentleman with first episode of renal colic several weeks ago.  He presented, was found on axial imaging to have a solitary left proximal ureteral stone that was 6 mm.  This was unable to be clearly seen on plain film imaging. Options were discussed including medical  therapy versus ureteroscopy versus shockwave lithotripsy.  He wished to proceed with ureteroscopic stone manipulation.  Informed consent was obtained and placed in medical record.  PROCEDURE IN DETAIL:  The patient being Deckard Stuber, procedure being left ureteroscopic stone manipulation was confirmed.  The procedure was carried out.  Time-out was performed.  Intravenous antibiotics administered.  General and LMA anesthesia was introduced.  The patient placed into a low lithotomy position.  Sterile field was created by prepping and draping the patient's penis, perineum, and proximal thighs using iodine x3.  Next, cystourethroscopy was performed using a 22- French rigid cystoscope with 12-degree offset lens.  Inspection of the anterior and posterior urethra unremarkable.  Inspection of the  bladder revealed no diverticula, calcifications, papular lesions.  The left ureteral orifice was then gently cannulated with 6-French end-hole catheter and left retrograde pyelogram was seen.  Left retrograde pyelogram demonstrated a single left ureter, single system left kidney, there was a filling defect at the UPJ consistent with known stone.  A 0.038 Glidewire was advanced at the level of the upper pole and set aside as a safety wire.  Next, semi-rigid ureteroscopy was performed on the distal half of the ureter with a 6.4- French semi-rigid ureteroscope alongside a separate Sensor working wire and an 8-French feeding tube in urinary bladder for pressure release. This revealed no mucosal abnormalities or calcifications in the distal ureter.  Next, a semi-rigid ureteroscope was exchanged for the 12/14, 38- cm ureteral access sheath which was carefully navigated to the level of the mid ureter using continuous fluoroscopic guidance.  Next, flexible ureteroscopy was performed using 8-French digital ureteroscope.  This was able to be navigated approximately 2 cm above the end of the access sheath, at which point the ureteral caliber was very narrow and able to accommodate the scope.  Single attempt was made to pass this in a train track fashion between the [facility] and a separate Sensor working wire and this was then able to be easily passed.  As such, the 8-French digital ureteroscope was exchanged  for a 6-French non-digital ureteroscope and again attempt was made to navigate the proximal ureter. This was accessible navigating approximately additional 6 cm up, however, this could not be navigated to the level of the UPJ where the stone was.  It was felt that given this proximal likely physiologic ureteral narrowing, the safest way to proceed would be to place ureteral stent today, allow for passive dilation, and repeat attempted ureteroscopy in several weeks.  As such, the access sheath  was removed under continuous ureteroscopic vision.  No mucosal abnormalities were found.  A new 6 x 26 double-J stent was then placed using cystoscopic and fluoroscopic guidance, good proximal and distal curl were noted. Efflux of urine was seen around through the distal end of the stent. Bladder was emptied per cystoscope.  Procedure was then terminated.  The patient tolerated procedure well.  There were no immediate periprocedural complications.  The patient was taken to postanesthesia care unit in stable condition.          ______________________________ Sebastian Ache, MD     TM/MEDQ  D:  09/24/2012  T:  09/24/2012  Job:  [id number]

## 2012-09-24 NOTE — Transfer of Care (Signed)
Immediate Anesthesia Transfer of Care Note  Patient: Austin [name]  Procedure(s) Performed: Procedure(s): CYSTOSCOPY WITH RETROGRADE PYELOGRAM, DIAGNOSTIC URETEROSCOPY AND STENT PLACEMENT (Left)  Patient Location: PACU  Anesthesia Type:General  Level of Consciousness: awake, alert  and oriented  Airway & Oxygen Therapy: Patient Spontanous Breathing and Patient connected to face mask oxygen  Post-op Assessment: Report given to PACU RN  Post vital signs: Reviewed and stable  Complications: No apparent anesthesia complications

## 2012-09-25 ENCOUNTER — Encounter (HOSPITAL_COMMUNITY): Payer: Self-pay | Admitting: *Deleted

## 2012-10-07 MED ORDER — GENTAMICIN SULFATE 40 MG/ML IJ SOLN
390.0000 mg | INTRAVENOUS | Status: AC
  Administered 2012-10-08: 390 mg via INTRAVENOUS
  Filled 2012-10-07: qty 9.75

## 2012-10-08 ENCOUNTER — Encounter (HOSPITAL_COMMUNITY): Payer: Self-pay | Admitting: *Deleted

## 2012-10-08 ENCOUNTER — Encounter (HOSPITAL_COMMUNITY)
Admission: RE | Disposition: A | Discharge status: Home / Self Care | Payer: Self-pay | Source: Ambulatory Visit | Attending: Urology

## 2012-10-08 ENCOUNTER — Ambulatory Visit (HOSPITAL_COMMUNITY): Payer: BC Managed Care – PPO | Admitting: *Deleted

## 2012-10-08 ENCOUNTER — Ambulatory Visit (HOSPITAL_COMMUNITY)
Admission: RE | Admit: 2012-10-08 | Discharge: 2012-10-08 | Disposition: A | Discharge status: Home / Self Care | Payer: BC Managed Care – PPO | Source: Ambulatory Visit | Attending: Urology | Admitting: Urology

## 2012-10-08 DIAGNOSIS — K219 Gastro-esophageal reflux disease without esophagitis: Secondary | ICD-10-CM | POA: Insufficient documentation

## 2012-10-08 DIAGNOSIS — N201 Calculus of ureter: Secondary | ICD-10-CM | POA: Insufficient documentation

## 2012-10-08 DIAGNOSIS — F17200 Nicotine dependence, unspecified, uncomplicated: Secondary | ICD-10-CM | POA: Insufficient documentation

## 2012-10-08 HISTORY — PX: HOLMIUM LASER APPLICATION: SHX5852

## 2012-10-08 HISTORY — PX: CYSTOSCOPY WITH RETROGRADE PYELOGRAM, URETEROSCOPY AND STENT PLACEMENT: SHX5789

## 2012-10-08 SURGERY — CYSTOURETEROSCOPY, WITH RETROGRADE PYELOGRAM AND STENT INSERTION
Anesthesia: General | Laterality: Left

## 2012-10-08 MED ORDER — LACTATED RINGERS IV SOLN: INTRAVENOUS | Status: DC

## 2012-10-08 MED ORDER — MEPERIDINE HCL 50 MG/ML IJ SOLN: 6.2500 mg | INTRAMUSCULAR | Status: DC | PRN

## 2012-10-08 MED ORDER — PHENYLEPHRINE HCL 10 MG/ML IJ SOLN
INTRAMUSCULAR | Status: DC | PRN
  Administered 2012-10-08 (×2): 40 ug via INTRAVENOUS

## 2012-10-08 MED ORDER — HYDROCODONE-ACETAMINOPHEN 5-325 MG PO TABS: 1.0000 | ORAL_TABLET | Freq: Four times a day (QID) | ORAL | Status: DC | PRN

## 2012-10-08 MED ORDER — FENTANYL CITRATE 0.05 MG/ML IJ SOLN
INTRAMUSCULAR | Status: DC | PRN
  Administered 2012-10-08 (×4): 25 ug via INTRAVENOUS

## 2012-10-08 MED ORDER — METOCLOPRAMIDE HCL 5 MG/ML IJ SOLN
INTRAMUSCULAR | Status: DC | PRN
  Administered 2012-10-08: 10 mg via INTRAVENOUS

## 2012-10-08 MED ORDER — EPHEDRINE SULFATE 50 MG/ML IJ SOLN
INTRAMUSCULAR | Status: DC | PRN
  Administered 2012-10-08 (×2): 5 mg via INTRAVENOUS

## 2012-10-08 MED ORDER — ONDANSETRON HCL 4 MG/2ML IJ SOLN
INTRAMUSCULAR | Status: DC | PRN
  Administered 2012-10-08: 4 mg via INTRAVENOUS

## 2012-10-08 MED ORDER — HYDROCODONE-ACETAMINOPHEN 5-325 MG PO TABS
1.0000 | ORAL_TABLET | Freq: Four times a day (QID) | ORAL | Status: DC | PRN
  Administered 2012-10-08: 1 via ORAL
  Filled 2012-10-08: qty 1

## 2012-10-08 MED ORDER — FENTANYL CITRATE 0.05 MG/ML IJ SOLN
INTRAMUSCULAR | Status: AC
  Filled 2012-10-08: qty 2

## 2012-10-08 MED ORDER — SODIUM CHLORIDE 0.9 % IR SOLN
Status: DC | PRN
  Administered 2012-10-08: 3000 mL via INTRAVESICAL

## 2012-10-08 MED ORDER — SULFAMETHOXAZOLE-TMP DS 800-160 MG PO TABS: 1.0000 | ORAL_TABLET | Freq: Every day | ORAL | Status: DC

## 2012-10-08 MED ORDER — PROPOFOL 10 MG/ML IV BOLUS
INTRAVENOUS | Status: DC | PRN
  Administered 2012-10-08: 200 ug via INTRAVENOUS

## 2012-10-08 MED ORDER — MIDAZOLAM HCL 5 MG/5ML IJ SOLN
INTRAMUSCULAR | Status: DC | PRN
  Administered 2012-10-08: 2 mg via INTRAVENOUS

## 2012-10-08 MED ORDER — LACTATED RINGERS IV SOLN
INTRAVENOUS | Status: DC
  Administered 2012-10-08: 13:00:00 via INTRAVENOUS
  Administered 2012-10-08: 1000 mL via INTRAVENOUS

## 2012-10-08 MED ORDER — FENTANYL CITRATE 0.05 MG/ML IJ SOLN
25.0000 ug | INTRAMUSCULAR | Status: DC | PRN
  Administered 2012-10-08: 50 ug via INTRAVENOUS

## 2012-10-08 MED ORDER — IOHEXOL 300 MG/ML  SOLN
INTRAMUSCULAR | Status: AC
  Filled 2012-10-08: qty 1

## 2012-10-08 MED ORDER — DEXAMETHASONE SODIUM PHOSPHATE 10 MG/ML IJ SOLN
INTRAMUSCULAR | Status: DC | PRN
  Administered 2012-10-08: 10 mg via INTRAVENOUS

## 2012-10-08 MED ORDER — KETAMINE HCL 10 MG/ML IJ SOLN
INTRAMUSCULAR | Status: DC | PRN
  Administered 2012-10-08: 25 mg via INTRAVENOUS

## 2012-10-08 MED ORDER — PROMETHAZINE HCL 25 MG/ML IJ SOLN: 6.2500 mg | INTRAMUSCULAR | Status: DC | PRN

## 2012-10-08 MED ORDER — FENTANYL CITRATE 0.05 MG/ML IJ SOLN: 25.0000 ug | INTRAMUSCULAR | Status: DC | PRN

## 2012-10-08 MED ORDER — LIDOCAINE HCL (CARDIAC) 20 MG/ML IV SOLN
INTRAVENOUS | Status: DC | PRN
  Administered 2012-10-08: 100 mg via INTRAVENOUS

## 2012-10-08 SURGICAL SUPPLY — 20 items
BAG URINE DRAINAGE (UROLOGICAL SUPPLIES) ×2 IMPLANT
BASKET LASER NITINOL 1.9FR (BASKET) ×2 IMPLANT
BASKET STNLS GEMINI 4WIRE 3FR (BASKET) IMPLANT
BASKET ZERO TIP NITINOL 2.4FR (BASKET) IMPLANT
CATH INTERMIT  6FR 70CM (CATHETERS) ×2 IMPLANT
DRAPE CAMERA CLOSED 9X96 (DRAPES) ×2 IMPLANT
ELECT REM PT RETURN 9FT ADLT (ELECTROSURGICAL)
ELECTRODE REM PT RTRN 9FT ADLT (ELECTROSURGICAL) IMPLANT
GLOVE BIOGEL M STRL SZ7.5 (GLOVE) ×2 IMPLANT
GOWN PREVENTION PLUS LG XLONG (DISPOSABLE) ×2 IMPLANT
GUIDEWIRE ANG ZIPWIRE 038X150 (WIRE) ×2 IMPLANT
GUIDEWIRE STR DUAL SENSOR (WIRE) ×2 IMPLANT
IV NS IRRIG 3000ML ARTHROMATIC (IV SOLUTION) ×2 IMPLANT
LASER FIBER DISP (UROLOGICAL SUPPLIES) ×2 IMPLANT
PACK CYSTO (CUSTOM PROCEDURE TRAY) ×2 IMPLANT
SHEATH ACCESS URETERAL 24CM (SHEATH) ×2 IMPLANT
STENT CONTOUR 6FRX26X.038 (STENTS) ×2 IMPLANT
SYRINGE 10CC LL (SYRINGE) IMPLANT
SYRINGE IRR TOOMEY STRL 70CC (SYRINGE) IMPLANT
TUBE FEEDING 8FR 16IN STR KANG (MISCELLANEOUS) ×2 IMPLANT

## 2012-10-08 NOTE — H&P (Signed)
Austin [name] is an 47 y.o. male.    Chief Complaint: Pre-Op Left Ureteroscopic Stone Manipulation  HPI:   1 - Left Ureteral Stone - Pt with first episode renal colic with left 6mm UPJ stone by CT 09/11/12, SSD 10cm, 600HU, no additional stones. Presently managing wtih well with hydrocodone and tamsulosin. Not easily visible on KUB. Underwent initial attempt ureteroscopic stone manipulation 09/24/12 at which time ureter very narrow and unable to reach stone, therefore stent placed to allow for passive dilation.  PMH sig for [employer] after viral syndrome 20 years ago. No CV disease. No strong blood thinners.  Today Austin [name] is seen to proceed with re-attempt left ureteroscopic stone manipulation after pre-stenting. No interval fevers.  Past Medical History  Diagnosis Date  . Anxiety   . History of kidney stones 09/19/12    left ureteral stone-SURGERY 8/6 URETEROSCOPY / STENT PLACEMENT   . GERD (gastroesophageal reflux disease)   . Headache(784.0)     hx of migraines-none recent    Past Surgical History  Procedure Laterality Date  . Tonsillectomy      t&a as a child  . Wisdom teeth extracted    . Cysto, left rgp, diagnostic ureteroscopy and stent placement  09/24/2012  . Cystoscopy with retrograde pyelogram, ureteroscopy and stent placement Left 09/24/2012    Procedure: CYSTOSCOPY WITH RETROGRADE PYELOGRAM, DIAGNOSTIC URETEROSCOPY AND STENT PLACEMENT;  Surgeon: Sebastian Ache, MD;  Location: [facility];  Service: Urology;  Laterality: Left;    History reviewed. No pertinent family history. Social History:  reports that he has been smoking Cigarettes.  He has a 28 pack-year smoking history. He has never used smokeless tobacco. He reports that he uses illicit drugs (Marijuana). He reports that he does not drink alcohol.  Allergies: No Known Allergies  No prescriptions prior to admission    No results found for this or any previous visit (from the past 48 hour(s)). No results  found.  Review of Systems  Constitutional: Negative.  Negative for fever and chills.  HENT: Negative.   Eyes: Negative.   Respiratory: Negative.   Cardiovascular: Negative.   Gastrointestinal: Negative.   Genitourinary: Negative.  Negative for dysuria.  Musculoskeletal: Negative.   Skin: Negative.   Neurological: Negative.   Endo/Heme/Allergies: Negative.   Psychiatric/Behavioral: Negative.     There were no vitals taken for this visit. Physical Exam  Constitutional: He is oriented to person, place, and time. He appears well-developed and well-nourished.  HENT:  Head: Normocephalic and atraumatic.  Eyes: EOM are normal. Pupils are equal, round, and reactive to light.  Neck: Normal range of motion. Neck supple.  Cardiovascular: Normal rate and regular rhythm.   Respiratory: Effort normal and breath sounds normal.  GI: Soft. Bowel sounds are normal.  Genitourinary: Penis normal.  Musculoskeletal: Normal range of motion.  Neurological: He is alert and oriented to person, place, and time.  Skin: Skin is warm and dry.  Psychiatric: He has a normal mood and affect. His behavior is normal. Judgment and thought content normal.     Assessment/Plan  1 - Left Ureteral Stone - We rediscussed ureteroscopic stone manipulation with basketing and laser-lithotripsy in detail.  We rediscussed risks including bleeding, infection, damage to kidney / ureter  bladder, rarely loss of kidney. Were discussed anesthetic risks and rare but serious surgical complications including DVT, PE, MI, and mortality. We specifically readdressed that in 5-10% of cases a staged approach is required with stenting followed by re-attempt ureteroscopy if anatomy unfavorable. The patient  voiced understanding and wises to proceed.   Austin [name] 10/08/2012, 6:50 AM

## 2012-10-08 NOTE — Anesthesia Preprocedure Evaluation (Signed)
Anesthesia Evaluation  Patient identified by MRN, date of birth, ID band Patient awake    Reviewed: Allergy & Precautions, H&P , NPO status , Patient's Chart, lab work & pertinent test results  Airway Mallampati: II TM Distance: >3 FB Neck ROM: full    Dental no notable dental hx. (+) Teeth Intact and Dental Advisory Given   Pulmonary neg pulmonary ROS,  breath sounds clear to auscultation  Pulmonary exam normal       Cardiovascular Exercise Tolerance: Good negative cardio ROS  Rhythm:regular Rate:Normal     Neuro/Psych negative neurological ROS  negative psych ROS   GI/Hepatic negative GI ROS, Neg liver ROS, GERD-  Medicated and Controlled,  Endo/Other  negative endocrine ROS  Renal/GU negative Renal ROS  negative genitourinary   Musculoskeletal   Abdominal   Peds  Hematology negative hematology ROS (+)   Anesthesia Other Findings   Reproductive/Obstetrics negative OB ROS                           Anesthesia Physical  Anesthesia Plan  ASA: II  Anesthesia Plan: General   Post-op Pain Management:    Induction: Intravenous  Airway Management Planned: LMA  Additional Equipment:   Intra-op Plan:   Post-operative Plan:   Informed Consent: I have reviewed the patients History and Physical, chart, labs and discussed the procedure including the risks, benefits and alternatives for the proposed anesthesia with the patient or authorized representative who has indicated his/her understanding and acceptance.   Dental Advisory Given  Plan Discussed with: CRNA and Surgeon  Anesthesia Plan Comments:         Anesthesia Quick Evaluation

## 2012-10-08 NOTE — Brief Op Note (Signed)
10/08/2012  12:50 PM  PATIENT:  Austin [name]  47 y.o. male  PRE-OPERATIVE DIAGNOSIS:  LEFT URETERAL STONE  POST-OPERATIVE DIAGNOSIS:  LEFT URETERAL STONE  PROCEDURE:  Procedure(s) with comments: CYSTOSCOPY WITH RETROGRADE PYELOGRAM, URETEROSCOPY AND STENT PLACEMENT (Left) - 1 HR NEEDS DIGITAL URETEROSCOPE  HOLMIUM LASER APPLICATION (Left)  SURGEON:  Surgeon(s) and Role:    * Sebastian Ache, MD - Primary  PHYSICIAN ASSISTANT:   ASSISTANTS: none   ANESTHESIA:   general  EBL:  Total I/O In: 1000 [I.V.:1000] Out: -   BLOOD ADMINISTERED:none  DRAINS: none   LOCAL MEDICATIONS USED:  NONE  SPECIMEN:  Source of Specimen:  Left Ureteral Stone  DISPOSITION OF SPECIMEN:  [facility] for compositional analysis  COUNTS:  YES  TOURNIQUET:  * No tourniquets in log *  DICTATION: .Other Dictation: Dictation Number [id number]  PLAN OF CARE: Discharge to home after PACU  PATIENT DISPOSITION:  PACU - hemodynamically stable.   Delay start of Pharmacological VTE agent (>24hrs) due to surgical blood loss or risk of bleeding: yes

## 2012-10-08 NOTE — Transfer of Care (Signed)
Immediate Anesthesia Transfer of Care Note  Patient: Austin [name]  Procedure(s) Performed: Procedure(s) with comments: CYSTOSCOPY WITH RETROGRADE PYELOGRAM, URETEROSCOPY AND STENT PLACEMENT (Left) - 1 HR NEEDS DIGITAL URETEROSCOPE  HOLMIUM LASER APPLICATION (Left)  Patient Location: PACU  Anesthesia Type:General  Level of Consciousness: awake, patient cooperative and responds to stimulation  Airway & Oxygen Therapy: Patient Spontanous Breathing and Patient connected to face mask oxygen  Post-op Assessment: Report given to PACU RN, Post -op Vital signs reviewed and stable and Patient moving all extremities  Post vital signs: Reviewed and stable  Complications: No apparent anesthesia complications

## 2012-10-08 NOTE — Preoperative (Signed)
Beta Blockers   Reason not to administer Beta Blockers:Not Applicable, not on home BB 

## 2012-10-09 ENCOUNTER — Encounter (HOSPITAL_COMMUNITY): Payer: Self-pay | Admitting: Urology

## 2012-10-09 NOTE — Op Note (Signed)
NAME:  VUE, PAVON NO.:  [id number]  MEDICAL RECORD NO.:  [id number]  LOCATION:  [facility]                         FACILITY:  [facility]  PHYSICIAN:  Sebastian Ache, MD     DATE OF BIRTH:  [birth year]  DATE OF PROCEDURE: 10/08/2012 DATE OF DISCHARGE:  10/08/2012                              OPERATIVE REPORT   DIAGNOSIS:  Retained left ureteral stone.  PROCEDURES: 1. Cystoscopy with left retrograde pyelogram and interpretation. 2. Exchange of left ureteral stent 6 x 26, no tether. 3. Left ureteroscopy with laser lithotripsy.  FINDINGS: 1. Narrow caliber midureter, only accommodating 6-French ureteral     scope despite pre-stenting.  No focal strictures. 2. Solitary stone repositioned to mid pole calyx for fragmentation and     removal.  ESTIMATED BLOOD LOSS:  Nil.  SPECIMEN:  Left ureteral stone fragments for compositional analysis.  INDICATION:  [name] is a very pleasant, 47 year old gentleman, history of first episode nephrolithiasis late last month.  He underwent initial trial of left ureteroscopic stone manipulation on September 24, 2012; however, his mid-upper ureter was quite narrow in caliber and did not accommodate the instrumentation.  He, therefore, underwent ureteral stenting to allow passive dilation with re-attempt to occur today. Informed consent was obtained and placed in medical record.  PROCEDURE IN DETAIL:  The patient is being [name] verified. Procedure is being left ureteroscopic stone was confirmed.  Procedure was carried out.  Time-out was performed.  Intravenous antibiotics were administered.  General LMA anesthesia was introduced.  The patient placed into a low lithotomy position.  Sterile field was created by prepping and draping the patient's penis, perineum, and proximal thighs using iodine x3.  Next, cystourethroscopy was performed using a 22- French rigid cystoscope with 12-degree offset lens.  Inspection of the anterior and  posterior urethra unremarkable.  Inspection of urinary bladder revealed no diverticula, calcifications, papillary lesions.  The left distal end of the ureteral stent was seen in situ.  This was grasped with cold graspers and brought to the level of urethral meatus through which a 0.038 Glidewire was advanced at the level of the upper pole.  The stent was exchanged for a 6-French end-hole catheter and left retrograde pyelogram was seen.  Left retrograde pyelogram demonstrated single left ureter, single system of left kidney.  There is a filling defect in the area of the UPJ consistent with known stone.  The Glidewire was once again advanced as a safety wire.  An 8-French feeding tube was placed in the urinary bladder for pressure release.  Next, semi-rigid ureteroscopy was performed in the distal two-thirds of the ureter alongside a 6.4-French semi-rigid ureteroscope and Sensor wire as working wire.  This revealed no mucosal abnormalities or calcifications to the distal two-thirds of the ureter. There was again a relative narrowing in the proximal third without focal strictures whatsoever.  It was felt that attempted digital ureteroscopy was warranted.  As such, the semi-rigid ureteroscope was exchanged for the 12/14.  A 24-cm ureteral access sheath was carefully placed at the level of the proximal ureter using fluoroscopic vision.  Next, digital ureteroscopy was performed using 8-French digital ureteroscope of the distal one-half of the ureter.  This revealed no mucosal abnormalities. However, the 8-French scope could not successfully navigate the proximal half of the ureter due to just progressive relative narrowing.  I felt the caliber was closed, but that excessive force would be hazardous.  As such, the 8-French digital ureteroscope was exchanged for the 6-French non-digital ureteroscope.  This easily navigated past this area until the level of the UPJ, where the stone in question was  identified, it was very dense pigmented stone.  This was repositioned to the mid pole calyx and holmium laser energy was applied.  This was done using settings of 0.5 joules and 5 Hz and 200 nm fiber breaking the stone into 3 smaller pieces.  These were then each grasped on their long axis and brought out in their entirety and set aside for compositional analysis.  Repeat systematic inspection of the entire left kidney including all calices revealed no additional calcifications and no mucosal abnormalities.  The entire length of the left ureter was inspected upon removal of the access sheath and no mucosal abnormalities were found.  Finally, a new 6 x 26 double-J stent was placed with remaining safety wire using cystoscopic and fluoroscopic guidance.  Good proximal and distal curl were noted.  Bladder was emptied per cystoscope.  Procedure was then terminated.  The patient procedure well.  There were no immediate periprocedural complications.  The patient taken to postanesthesia care unit in stable condition.          ______________________________ Sebastian Ache, MD     TM/MEDQ  D:  10/08/2012  T:  10/08/2012  Job:  [id number]

## 2012-10-09 NOTE — Anesthesia Postprocedure Evaluation (Signed)
  Anesthesia Post-op Note  Patient: Austin [name]  Procedure(s) Performed: Procedure(s) (LRB): CYSTOSCOPY WITH RETROGRADE PYELOGRAM, URETEROSCOPY AND STENT PLACEMENT (Left) HOLMIUM LASER APPLICATION (Left)  Patient Location: PACU  Anesthesia Type: General  Level of Consciousness: awake and alert   Airway and Oxygen Therapy: Patient Spontanous Breathing  Post-op Pain: mild  Post-op Assessment: Post-op Vital signs reviewed, Patient's Cardiovascular Status Stable, Respiratory Function Stable, Patent Airway and No signs of Nausea or vomiting  Last Vitals:  Filed Vitals:   10/08/12 1454  BP: 111/78  Pulse: 78  Temp: 36.3 C  Resp: 16    Post-op Vital Signs: stable   Complications: No apparent anesthesia complications

## 2016-09-07 ENCOUNTER — Inpatient Hospital Stay (HOSPITAL_COMMUNITY)
Admission: EM | Admit: 2016-09-07 | Discharge: 2016-09-11 | DRG: 378 | Disposition: A | Discharge status: Home / Self Care | Payer: Managed Care, Other (non HMO) | Attending: Internal Medicine | Admitting: Internal Medicine

## 2016-09-07 ENCOUNTER — Encounter (HOSPITAL_COMMUNITY): Payer: Self-pay | Admitting: Nurse Practitioner

## 2016-09-07 ENCOUNTER — Emergency Department (HOSPITAL_COMMUNITY): Payer: Managed Care, Other (non HMO)

## 2016-09-07 DIAGNOSIS — K219 Gastro-esophageal reflux disease without esophagitis: Secondary | ICD-10-CM | POA: Diagnosis present

## 2016-09-07 DIAGNOSIS — K922 Gastrointestinal hemorrhage, unspecified: Secondary | ICD-10-CM

## 2016-09-07 DIAGNOSIS — K254 Chronic or unspecified gastric ulcer with hemorrhage: Secondary | ICD-10-CM | POA: Diagnosis present

## 2016-09-07 DIAGNOSIS — Z6820 Body mass index (BMI) 20.0-20.9, adult: Secondary | ICD-10-CM

## 2016-09-07 DIAGNOSIS — R1013 Epigastric pain: Secondary | ICD-10-CM | POA: Diagnosis present

## 2016-09-07 DIAGNOSIS — D62 Acute posthemorrhagic anemia: Secondary | ICD-10-CM | POA: Diagnosis present

## 2016-09-07 DIAGNOSIS — E876 Hypokalemia: Secondary | ICD-10-CM | POA: Diagnosis present

## 2016-09-07 DIAGNOSIS — F17210 Nicotine dependence, cigarettes, uncomplicated: Secondary | ICD-10-CM | POA: Diagnosis present

## 2016-09-07 DIAGNOSIS — T39395A Adverse effect of other nonsteroidal anti-inflammatory drugs [NSAID], initial encounter: Secondary | ICD-10-CM | POA: Diagnosis present

## 2016-09-07 DIAGNOSIS — K920 Hematemesis: Secondary | ICD-10-CM | POA: Diagnosis present

## 2016-09-07 DIAGNOSIS — K269 Duodenal ulcer, unspecified as acute or chronic, without hemorrhage or perforation: Secondary | ICD-10-CM

## 2016-09-07 DIAGNOSIS — Z87442 Personal history of urinary calculi: Secondary | ICD-10-CM

## 2016-09-07 DIAGNOSIS — K921 Melena: Secondary | ICD-10-CM | POA: Diagnosis present

## 2016-09-07 DIAGNOSIS — Z791 Long term (current) use of non-steroidal anti-inflammatories (NSAID): Secondary | ICD-10-CM

## 2016-09-07 DIAGNOSIS — R64 Cachexia: Secondary | ICD-10-CM | POA: Diagnosis present

## 2016-09-07 DIAGNOSIS — K2960 Other gastritis without bleeding: Secondary | ICD-10-CM

## 2016-09-07 HISTORY — DX: Hematemesis: K92.0

## 2016-09-07 HISTORY — DX: Gastrointestinal hemorrhage, unspecified: K92.2

## 2016-09-07 LAB — PROTIME-INR
INR: 1.08
Prothrombin Time: 14.1 seconds (ref 11.4–15.2)

## 2016-09-07 LAB — MAGNESIUM: MAGNESIUM: 1.5 mg/dL — AB (ref 1.7–2.4)

## 2016-09-07 LAB — LIPASE, BLOOD: LIPASE: 19 U/L (ref 11–51)

## 2016-09-07 LAB — CBC
HCT: 31.8 % — ABNORMAL LOW (ref 39.0–52.0)
HEMATOCRIT: 35 % — AB (ref 39.0–52.0)
HEMOGLOBIN: 9.1 g/dL — AB (ref 13.0–17.0)
Hemoglobin: 10.2 g/dL — ABNORMAL LOW (ref 13.0–17.0)
MCH: 20.6 pg — ABNORMAL LOW (ref 26.0–34.0)
MCH: 20.4 pg — AB (ref 26.0–34.0)
MCHC: 29.1 g/dL — ABNORMAL LOW (ref 30.0–36.0)
MCHC: 28.6 g/dL — ABNORMAL LOW (ref 30.0–36.0)
MCV: 70.9 fL — AB (ref 78.0–100.0)
MCV: 71.1 fL — AB (ref 78.0–100.0)
Platelets: 367 10*3/uL (ref 150–400)
Platelets: 332 10*3/uL (ref 150–400)
RBC: 4.94 MIL/uL (ref 4.22–5.81)
RBC: 4.47 MIL/uL (ref 4.22–5.81)
RDW: 17.6 % — ABNORMAL HIGH (ref 11.5–15.5)
RDW: 17.5 % — ABNORMAL HIGH (ref 11.5–15.5)
WBC: 14.2 10*3/uL — AB (ref 4.0–10.5)
WBC: 13.8 10*3/uL — ABNORMAL HIGH (ref 4.0–10.5)

## 2016-09-07 LAB — RETICULOCYTES
RBC.: 4.44 MIL/uL (ref 4.22–5.81)
RETIC COUNT ABSOLUTE: 48.8 10*3/uL (ref 19.0–186.0)
Retic Ct Pct: 1.1 % (ref 0.4–3.1)

## 2016-09-07 LAB — COMPREHENSIVE METABOLIC PANEL
ALT: 13 U/L — AB (ref 17–63)
AST: 23 U/L (ref 15–41)
Albumin: 4.2 g/dL (ref 3.5–5.0)
Alkaline Phosphatase: 68 U/L (ref 38–126)
Anion gap: 14 (ref 5–15)
BILIRUBIN TOTAL: 0.5 mg/dL (ref 0.3–1.2)
BUN: 29 mg/dL — AB (ref 6–20)
CALCIUM: 8.9 mg/dL (ref 8.9–10.3)
CO2: 25 mmol/L (ref 22–32)
CREATININE: 0.67 mg/dL (ref 0.61–1.24)
Chloride: 101 mmol/L (ref 101–111)
GFR calc Af Amer: >60 mL/min (ref >60)
Glucose, Bld: 141 mg/dL — ABNORMAL HIGH (ref 65–99)
Potassium: 3.1 mmol/L — ABNORMAL LOW (ref 3.5–5.1)
Sodium: 140 mmol/L (ref 135–145)
TOTAL PROTEIN: 8 g/dL (ref 6.5–8.1)

## 2016-09-07 LAB — VITAMIN B12: Vitamin B-12: 572 pg/mL (ref 180–914)

## 2016-09-07 LAB — IRON AND TIBC
IRON: 11 ug/dL — AB (ref 45–182)
Saturation Ratios: 3 % — ABNORMAL LOW (ref 17.9–39.5)
TIBC: 389 ug/dL (ref 250–450)
UIBC: 378 ug/dL

## 2016-09-07 LAB — TYPE AND SCREEN
ABO/RH(D): A POS
Antibody Screen: NEGATIVE

## 2016-09-07 LAB — MRSA PCR SCREENING: MRSA by PCR: NEGATIVE

## 2016-09-07 LAB — FOLATE: FOLATE: 17.7 ng/mL (ref >5.9)

## 2016-09-07 LAB — ABO/RH: ABO/RH(D): A POS

## 2016-09-07 LAB — FERRITIN: FERRITIN: 4 ng/mL — AB (ref 24–336)

## 2016-09-07 MED ORDER — ACETAMINOPHEN 650 MG RE SUPP: 650.0000 mg | Freq: Four times a day (QID) | RECTAL | Status: DC | PRN

## 2016-09-07 MED ORDER — IOPAMIDOL (ISOVUE-300) INJECTION 61%
INTRAVENOUS | Status: AC
  Administered 2016-09-07: 100 mL via INTRAVENOUS
  Filled 2016-09-07: qty 100

## 2016-09-07 MED ORDER — SODIUM CHLORIDE 0.9 % IV BOLUS (SEPSIS)
1000.0000 mL | Freq: Once | INTRAVENOUS | Status: AC
  Administered 2016-09-07: 1000 mL via INTRAVENOUS

## 2016-09-07 MED ORDER — MORPHINE SULFATE (PF) 4 MG/ML IV SOLN
4.0000 mg | Freq: Once | INTRAVENOUS | Status: AC
  Administered 2016-09-07: 4 mg via INTRAVENOUS
  Filled 2016-09-07: qty 1

## 2016-09-07 MED ORDER — ACETAMINOPHEN 325 MG PO TABS: 650.0000 mg | ORAL_TABLET | Freq: Four times a day (QID) | ORAL | Status: DC | PRN

## 2016-09-07 MED ORDER — METOCLOPRAMIDE HCL 5 MG/ML IJ SOLN
10.0000 mg | Freq: Once | INTRAMUSCULAR | Status: AC
Start: 2016-09-07 — End: 2016-09-07
  Administered 2016-09-07: 10 mg via INTRAVENOUS
  Filled 2016-09-07: qty 2

## 2016-09-07 MED ORDER — ONDANSETRON HCL 4 MG PO TABS: 4.0000 mg | ORAL_TABLET | Freq: Four times a day (QID) | ORAL | Status: DC | PRN

## 2016-09-07 MED ORDER — SODIUM CHLORIDE 0.9 % IV SOLN
8.0000 mg/h | INTRAVENOUS | Status: DC
  Administered 2016-09-07 – 2016-09-10 (×6): 8 mg/h via INTRAVENOUS
  Filled 2016-09-07 (×11): qty 80

## 2016-09-07 MED ORDER — SODIUM CHLORIDE 0.9 % IV SOLN
80.0000 mg | Freq: Once | INTRAVENOUS | Status: AC
  Administered 2016-09-07: 80 mg via INTRAVENOUS
  Filled 2016-09-07: qty 80

## 2016-09-07 MED ORDER — TAMSULOSIN HCL 0.4 MG PO CAPS: 0.4000 mg | ORAL_CAPSULE | Freq: Every day | ORAL | Status: DC

## 2016-09-07 MED ORDER — IOPAMIDOL (ISOVUE-300) INJECTION 61%
100.0000 mL | Freq: Once | INTRAVENOUS | Status: AC | PRN
  Administered 2016-09-07: 100 mL via INTRAVENOUS

## 2016-09-07 MED ORDER — PANTOPRAZOLE SODIUM 40 MG IV SOLR
40.0000 mg | Freq: Two times a day (BID) | INTRAVENOUS | Status: DC
  Administered 2016-09-11: 40 mg via INTRAVENOUS
  Filled 2016-09-07: qty 40

## 2016-09-07 MED ORDER — DIPHENHYDRAMINE HCL 50 MG/ML IJ SOLN
25.0000 mg | Freq: Once | INTRAMUSCULAR | Status: AC
  Administered 2016-09-07: 25 mg via INTRAVENOUS
  Filled 2016-09-07: qty 1

## 2016-09-07 MED ORDER — POTASSIUM CHLORIDE 10 MEQ/100ML IV SOLN
10.0000 meq | INTRAVENOUS | Status: AC
  Administered 2016-09-07 (×3): 10 meq via INTRAVENOUS
  Filled 2016-09-07 (×3): qty 100

## 2016-09-07 MED ORDER — HYDROCODONE-ACETAMINOPHEN 5-325 MG PO TABS
1.0000 | ORAL_TABLET | Freq: Four times a day (QID) | ORAL | Status: DC | PRN
  Administered 2016-09-07 – 2016-09-10 (×9): 1 via ORAL
  Administered 2016-09-10 – 2016-09-11 (×3): 2 via ORAL
  Filled 2016-09-07 (×3): qty 1
  Filled 2016-09-07: qty 2
  Filled 2016-09-07 (×5): qty 1
  Filled 2016-09-07: qty 2
  Filled 2016-09-07: qty 1
  Filled 2016-09-07: qty 2

## 2016-09-07 MED ORDER — ONDANSETRON HCL 4 MG/2ML IJ SOLN: 4.0000 mg | Freq: Four times a day (QID) | INTRAMUSCULAR | Status: DC | PRN

## 2016-09-07 NOTE — ED Triage Notes (Signed)
Patient states he has been having N/V and severe abdominal pain staring last night. The vomit has been dark brown in color. Patient states this has been recurrent throughout the year with this being the worst so far. +othrostatic Bps.

## 2016-09-07 NOTE — ED Provider Notes (Signed)
[facility] Provider Note   CSN: [id number] Arrival date & time: 09/07/16  1331     History   Chief Complaint No chief complaint on file.   HPI Austin [name] is a 51 y.o. male.  HPI   51 yo M here with vomiting, dehydration, weight loss. Pt states that for the past 2 months he has had increasingly frequent episodes of nausea, abdominal pain, and vomiting, occasionally with blood-tinged emesis. He has had concomitant decreased appetite and has lost >20 lb over 2 months despite not trying to lose weight. Denies night sweats but has had some chills. He has had poor appetite and early satiety. Denies alcohol abuse, does regularly take Goody's powder "for years." His current episode began 2-3 days ago and is more painful and severe. He reports he is now bringing up bloody emesis, with black, coffee-like component.  Past Medical History:  Diagnosis Date  . Anxiety   . GERD (gastroesophageal reflux disease)   . Headache(784.0)    hx of migraines-none recent  . History of kidney stones 09/19/12   left ureteral stone-SURGERY 8/6 URETEROSCOPY / STENT PLACEMENT     Patient Active Problem List   Diagnosis Date Noted  . Upper GI bleed 09/07/2016  . Hematemesis 09/07/2016  . NSAID long-term use     Past Surgical History:  Procedure Laterality Date  . CYSTO, LEFT RGP, DIAGNOSTIC URETEROSCOPY AND STENT PLACEMENT  09/24/2012  . CYSTOSCOPY WITH RETROGRADE PYELOGRAM, URETEROSCOPY AND STENT PLACEMENT Left 09/24/2012   Procedure: CYSTOSCOPY WITH RETROGRADE PYELOGRAM, DIAGNOSTIC URETEROSCOPY AND STENT PLACEMENT;  Surgeon: Sebastian Ache, MD;  Location: [facility];  Service: Urology;  Laterality: Left;  . CYSTOSCOPY WITH RETROGRADE PYELOGRAM, URETEROSCOPY AND STENT PLACEMENT Left 10/08/2012   Procedure: CYSTOSCOPY WITH RETROGRADE PYELOGRAM, URETEROSCOPY AND STENT PLACEMENT;  Surgeon: Sebastian Ache, MD;  Location: [facility];  Service: Urology;  Laterality: Left;  1 HR NEEDS DIGITAL  URETEROSCOPE   . HOLMIUM LASER APPLICATION Left 10/08/2012   Procedure: HOLMIUM LASER APPLICATION;  Surgeon: Sebastian Ache, MD;  Location: [facility];  Service: Urology;  Laterality: Left;  . TONSILLECTOMY     t&a as a child  . wisdom teeth extracted         Home Medications    Prior to Admission medications   Medication Sig Start Date End Date Taking? Authorizing Provider  Aspirin-Acetaminophen-Caffeine (GOODY HEADACHE PO) Take 1 packet by mouth every 8 (eight) hours as needed (headaches).    Yes [provider]  omeprazole (PRILOSEC) 20 MG capsule Take 20 mg by mouth daily.   Yes [provider]  HYDROcodone-acetaminophen (NORCO/VICODIN) 5-325 MG per tablet Take 1-2 tablets by mouth every 6 (six) hours as needed for pain. Patient not taking: Reported on 09/07/2016 10/08/12   Sebastian Ache, MD  ibuprofen (ADVIL,MOTRIN) 800 MG tablet Take 1 tablet (800 mg total) by mouth every 8 (eight) hours as needed for pain. Patient not taking: Reported on 09/07/2016 09/11/12   Ethelda Chick, MD  oxybutynin (DITROPAN) 5 MG tablet Take 1 tablet (5 mg total) by mouth every 8 (eight) hours as needed. For bladder spasms / stent discomfort. Patient not taking: Reported on 09/07/2016 09/24/12   Sebastian Ache, MD  senna-docusate (SENOKOT-S) 8.6-50 MG per tablet Take 1 tablet by mouth 2 (two) times daily. While taking pain meds to prevent constipation Patient not taking: Reported on 09/07/2016 09/24/12   Sebastian Ache, MD  sulfamethoxazole-trimethoprim (BACTRIM DS) 800-160 MG per tablet Take 1 tablet by mouth daily. X 3  days. Begin day prior to next Urology appointment. Patient not taking: Reported on 09/07/2016 10/08/12   Sebastian Ache, MD  tamsulosin (FLOMAX) 0.4 MG CAPS capsule Take 1 capsule (0.4 mg total) by mouth daily. For stent and stone discomfort Patient not taking: Reported on 09/07/2016 09/24/12   Sebastian Ache, MD    Family History History reviewed. No pertinent family  history.  Social History Social History  Substance Use Topics  . Smoking status: Current Every Day Smoker    Packs/day: 1.00    Years: 28.00    Types: Cigarettes  . Smokeless tobacco: Never Used  . Alcohol use No     Comment: last marijuana was a year ago    weekend alcohol     Allergies   Patient has no known allergies.   Review of Systems Review of Systems  Constitutional: Positive for appetite change, chills, fatigue and unexpected weight change.  Gastrointestinal: Positive for abdominal pain, nausea and vomiting.  Neurological: Positive for weakness.  All other systems reviewed and are negative.    Physical Exam Updated Vital Signs BP (!) 156/66   Pulse 88   Temp 98.2 F (36.8 C) (Oral)   Resp (!) 24   Ht 6' (1.829 m)   Wt 67.6 kg (149 lb 0.5 oz)   SpO2 96%   BMI 20.21 kg/m   Physical Exam  Constitutional: He is oriented to person, place, and time. He appears well-developed and well-nourished. No distress.  HENT:  Head: Normocephalic and atraumatic.  Eyes: Conjunctivae are normal.  Neck: Neck supple.  Cardiovascular: Normal rate, regular rhythm and normal heart sounds.  Exam reveals no friction rub.   No murmur heard. Pulmonary/Chest: Effort normal and breath sounds normal. No respiratory distress. He has no wheezes. He has no rales.  Abdominal: Soft. Normal appearance. He exhibits no distension. There is tenderness in the epigastric area. There is guarding. There is no rigidity and no rebound.  Musculoskeletal: He exhibits no edema.  Neurological: He is alert and oriented to person, place, and time. He exhibits normal muscle tone.  Skin: Skin is warm. Capillary refill takes less than 2 seconds.  Psychiatric: He has a normal mood and affect.  Nursing note and vitals reviewed.    ED Treatments / Results  Labs (all labs ordered are listed, but only abnormal results are displayed) Labs Reviewed  CBC - Abnormal; Notable for the following:       Result  Value   WBC 14.2 (*)    Hemoglobin 10.2 (*)    HCT 35.0 (*)    MCV 70.9 (*)    MCH 20.6 (*)    MCHC 29.1 (*)    RDW 17.6 (*)    All other components within normal limits  COMPREHENSIVE METABOLIC PANEL - Abnormal; Notable for the following:    Potassium 3.1 (*)    Glucose, Bld 141 (*)    BUN 29 (*)    ALT 13 (*)    All other components within normal limits  CBC - Abnormal; Notable for the following:    WBC 13.8 (*)    Hemoglobin 9.1 (*)    HCT 31.8 (*)    MCV 71.1 (*)    MCH 20.4 (*)    MCHC 28.6 (*)    RDW 17.5 (*)    All other components within normal limits  CBC - Abnormal; Notable for the following:    WBC 11.8 (*)    Hemoglobin 8.6 (*)    HCT 30.6 (*)  MCV 72.0 (*)    MCH 20.2 (*)    MCHC 28.1 (*)    RDW 17.7 (*)    All other components within normal limits  IRON AND TIBC - Abnormal; Notable for the following:    Iron 11 (*)    Saturation Ratios 3 (*)    All other components within normal limits  FERRITIN - Abnormal; Notable for the following:    Ferritin 4 (*)    All other components within normal limits  MAGNESIUM - Abnormal; Notable for the following:    Magnesium 1.5 (*)    All other components within normal limits  COMPREHENSIVE METABOLIC PANEL - Abnormal; Notable for the following:    Potassium 3.4 (*)    Glucose, Bld 109 (*)    BUN 23 (*)    Calcium 8.6 (*)    ALT 11 (*)    All other components within normal limits  MRSA PCR SCREENING  LIPASE, BLOOD  PROTIME-INR  HIV ANTIBODY (ROUTINE TESTING)  VITAMIN B12  FOLATE  RETICULOCYTES  OCCULT BLOOD GASTRIC / DUODENUM (SPECIMEN CUP)  CBC  CBC  TYPE AND SCREEN  ABO/RH    EKG  EKG Interpretation None       Radiology Ct Abdomen Pelvis W Contrast  Result Date: 09/07/2016 CLINICAL DATA:  Patient states he has been having N/V and severe abdominal pain staring last night. The vomit has been dark brown in color. Patient states this has been recurrent throughout the year with this being the worst  so farPt.states mid abdominal pain x's 3 days, nausea, vomiting EXAM: CT ABDOMEN AND PELVIS WITH CONTRAST TECHNIQUE: Multidetector CT imaging of the abdomen and pelvis was performed using the standard protocol following bolus administration of intravenous contrast. CONTRAST:  100 mL of Isovue-300 intravenous contrast COMPARISON:  09/12/2012 FINDINGS: Lower chest: No acute abnormality. Hepatobiliary: No focal liver abnormality is seen. No gallstones, gallbladder wall thickening, or biliary dilatation. Pancreas: Unremarkable. No pancreatic ductal dilatation or surrounding inflammatory changes. Spleen: Normal in size without focal abnormality. Adrenals/Urinary Tract: No adrenal masses. 9 mm low-density left midpole renal cyst. No other renal masses or lesions. No stones. No hydronephrosis. Normal ureters. Normal bladder. Stomach/Bowel: There is wall thickening of the gastric antrum mild hazy opacity in the adjacent fat. This is consistent with gastritis. Small bowel: Unremarkable.  Appendix not visualized. Vascular/Lymphatic: Aortic atherosclerosis. No enlarged abdominal or pelvic lymph nodes. Reproductive: Unremarkable. Other: No abdominal wall hernia or abnormality. No abdominopelvic ascites. Musculoskeletal: No acute or significant osseous findings. IMPRESSION: 1. Wall thickening of the gastric antrum with adjacent inflammatory change. This is consistent with gastritis. Consider follow-up upper endoscopy or upper GI for further assessment. 2. No other evidence of an acute abnormality. 3. Small left renal cyst. 4. Aortic atherosclerosis. Electronically Signed   By: Amie Portland M.D.   On: 09/07/2016 15:47    Procedures Procedures (including critical care time)  Medications Ordered in ED Medications  pantoprazole (PROTONIX) 80 mg in sodium chloride 0.9 % 250 mL (0.32 mg/mL) infusion (8 mg/hr Intravenous New Bag/Given 09/08/16 0208)  pantoprazole (PROTONIX) injection 40 mg (not administered)   HYDROcodone-acetaminophen (NORCO/VICODIN) 5-325 MG per tablet 1-2 tablet (1 tablet Oral Given 09/07/16 2107)  acetaminophen (TYLENOL) tablet 650 mg (not administered)    Or  acetaminophen (TYLENOL) suppository 650 mg (not administered)  ondansetron (ZOFRAN) tablet 4 mg (not administered)    Or  ondansetron (ZOFRAN) injection 4 mg (not administered)  pantoprazole (PROTONIX) 80 mg in sodium chloride 0.9 % 100  mL IVPB (0 mg Intravenous Stopped 09/07/16 1610)  sodium chloride 0.9 % bolus 1,000 mL (0 mLs Intravenous Stopped 09/07/16 1537)  metoCLOPramide (REGLAN) injection 10 mg (10 mg Intravenous Given 09/07/16 1536)  diphenhydrAMINE (BENADRYL) injection 25 mg (25 mg Intravenous Given 09/07/16 1535)  morphine 4 MG/ML injection 4 mg (4 mg Intravenous Given 09/07/16 1536)  iopamidol (ISOVUE-300) 61 % injection 100 mL (100 mLs Intravenous Contrast Given 09/07/16 1521)  potassium chloride 10 mEq in 100 mL IVPB (0 mEq Intravenous Stopped 09/07/16 2100)     Initial Impression / Assessment and Plan / ED Course  I have reviewed the triage vital signs and the nursing notes.  Pertinent labs & imaging results that were available during my care of the patient were reviewed by me and considered in my medical decision making (see chart for details).     51 yo M with PMHx as above here with epigastric abdominal pain, nausea, vomiting, blood-tinged emesis. Suspect PUD with active ulcer. Pt having blood-tinged emesis in room, and has significant risk factor of heavy, daily ASA use. No h/o alcoholism, cirrhosis, or risk factors for varices. No blood thinner use. Lab work shows likely acute on chronic IDA (low MCV, elevated RDW), also elevated BUN c/w UGIB. CT scan c/w gastritis, possible ulcer disease. No perforation. Discused with GI. PPI bolus and gtt started, type and screen sent, and will admit.  Final Clinical Impressions(s) / ED Diagnoses   Final diagnoses:  UGIB (upper gastrointestinal bleed)  NSAID induced  gastritis    New Prescriptions Current Discharge Medication List       Shaune Pollack, MD 09/08/16 [phone]

## 2016-09-07 NOTE — Consult Note (Signed)
Referring Provider: Dr. Erma Heritage Primary Care Physician:  Patient, No Pcp Per Primary Gastroenterologist:  Gentry Fitz  Reason for Consultation:  UGIB  HPI: Austin [name] is a 51 y.o. male with limited past medical history who presented to [facility] ED with complaints of upper abdominal pain and vomiting blood.  Uses at least 2 BC or Goody powders per day for several years.  Has been experiencing upper abdominal pain on and off for a few months.  Has also had intermittent vomiting, sometimes with very small amounts of blood, but much worse and more frequent recently.  Overnight he had a lot of vomiting with large amounts of blood and having a lot of abdominal pain.  Upon presentation to the ED Hgb is 10.2 grams with MCV of 71.  Last was 3 years ago and was 14.9 at that time.  BUN elevated at 29.  CT scan showed wall thickening of the gastric antrum with adjacent inflammatory change c/w gastritis.  Never had GI evaluation in the past.  Has been on PPI gtt.   Past Medical History:  Diagnosis Date  . Anxiety   . GERD (gastroesophageal reflux disease)   . Headache(784.0)    hx of migraines-none recent  . History of kidney stones 09/19/12   left ureteral stone-SURGERY 8/6 URETEROSCOPY / STENT PLACEMENT     Past Surgical History:  Procedure Laterality Date  . CYSTO, LEFT RGP, DIAGNOSTIC URETEROSCOPY AND STENT PLACEMENT  09/24/2012  . CYSTOSCOPY WITH RETROGRADE PYELOGRAM, URETEROSCOPY AND STENT PLACEMENT Left 09/24/2012   Procedure: CYSTOSCOPY WITH RETROGRADE PYELOGRAM, DIAGNOSTIC URETEROSCOPY AND STENT PLACEMENT;  Surgeon: Sebastian Ache, MD;  Location: [facility];  Service: Urology;  Laterality: Left;  . CYSTOSCOPY WITH RETROGRADE PYELOGRAM, URETEROSCOPY AND STENT PLACEMENT Left 10/08/2012   Procedure: CYSTOSCOPY WITH RETROGRADE PYELOGRAM, URETEROSCOPY AND STENT PLACEMENT;  Surgeon: Sebastian Ache, MD;  Location: [facility];  Service: Urology;  Laterality: Left;  1 HR NEEDS DIGITAL URETEROSCOPE   . HOLMIUM LASER  APPLICATION Left 10/08/2012   Procedure: HOLMIUM LASER APPLICATION;  Surgeon: Sebastian Ache, MD;  Location: [facility];  Service: Urology;  Laterality: Left;  . TONSILLECTOMY     t&a as a child  . wisdom teeth extracted      Prior to Admission medications   Medication Sig Start Date End Date Taking? Authorizing Provider  Aspirin-Acetaminophen-Caffeine (GOODY HEADACHE PO) Take 1 packet by mouth every 8 (eight) hours as needed (headaches).    Yes [provider]  omeprazole (PRILOSEC) 20 MG capsule Take 20 mg by mouth daily.   Yes [provider]  HYDROcodone-acetaminophen (NORCO/VICODIN) 5-325 MG per tablet Take 1-2 tablets by mouth every 6 (six) hours as needed for pain. Patient not taking: Reported on 09/07/2016 10/08/12   Sebastian Ache, MD  ibuprofen (ADVIL,MOTRIN) 800 MG tablet Take 1 tablet (800 mg total) by mouth every 8 (eight) hours as needed for pain. Patient not taking: Reported on 09/07/2016 09/11/12   Ethelda Chick, MD  oxybutynin (DITROPAN) 5 MG tablet Take 1 tablet (5 mg total) by mouth every 8 (eight) hours as needed. For bladder spasms / stent discomfort. Patient not taking: Reported on 09/07/2016 09/24/12   Sebastian Ache, MD  senna-docusate (SENOKOT-S) 8.6-50 MG per tablet Take 1 tablet by mouth 2 (two) times daily. While taking pain meds to prevent constipation Patient not taking: Reported on 09/07/2016 09/24/12   Sebastian Ache, MD  sulfamethoxazole-trimethoprim (BACTRIM DS) 800-160 MG per tablet Take 1 tablet by mouth daily. X 3 days. Begin day prior  to next Urology appointment. Patient not taking: Reported on 09/07/2016 10/08/12   Sebastian Ache, MD  tamsulosin (FLOMAX) 0.4 MG CAPS capsule Take 1 capsule (0.4 mg total) by mouth daily. For stent and stone discomfort Patient not taking: Reported on 09/07/2016 09/24/12   Sebastian Ache, MD    Current Facility-Administered Medications  Medication Dose Route Frequency Provider Last Rate Last Dose  . pantoprazole  (PROTONIX) 80 mg in sodium chloride 0.9 % 250 mL (0.32 mg/mL) infusion  8 mg/hr Intravenous Continuous Shaune Pollack, MD 25 mL/hr at 09/07/16 1540 8 mg/hr at 09/07/16 1540  . [START ON 09/11/2016] pantoprazole (PROTONIX) injection 40 mg  40 mg Intravenous Q12H Shaune Pollack, MD       Current Outpatient Prescriptions  Medication Sig Dispense Refill  . Aspirin-Acetaminophen-Caffeine (GOODY HEADACHE PO) Take 1 packet by mouth every 8 (eight) hours as needed (headaches).     Marland Kitchen omeprazole (PRILOSEC) 20 MG capsule Take 20 mg by mouth daily.    Marland Kitchen HYDROcodone-acetaminophen (NORCO/VICODIN) 5-325 MG per tablet Take 1-2 tablets by mouth every 6 (six) hours as needed for pain. (Patient not taking: Reported on 09/07/2016) 30 tablet 1  . ibuprofen (ADVIL,MOTRIN) 800 MG tablet Take 1 tablet (800 mg total) by mouth every 8 (eight) hours as needed for pain. (Patient not taking: Reported on 09/07/2016) 30 tablet 0  . oxybutynin (DITROPAN) 5 MG tablet Take 1 tablet (5 mg total) by mouth every 8 (eight) hours as needed. For bladder spasms / stent discomfort. (Patient not taking: Reported on 09/07/2016) 20 tablet 2  . senna-docusate (SENOKOT-S) 8.6-50 MG per tablet Take 1 tablet by mouth 2 (two) times daily. While taking pain meds to prevent constipation (Patient not taking: Reported on 09/07/2016) 30 tablet 1  . sulfamethoxazole-trimethoprim (BACTRIM DS) 800-160 MG per tablet Take 1 tablet by mouth daily. X 3 days. Begin day prior to next Urology appointment. (Patient not taking: Reported on 09/07/2016) 3 tablet 0  . tamsulosin (FLOMAX) 0.4 MG CAPS capsule Take 1 capsule (0.4 mg total) by mouth daily. For stent and stone discomfort (Patient not taking: Reported on 09/07/2016) 30 capsule 0    Allergies as of 09/07/2016  . (No Known Allergies)    History reviewed. No pertinent family history.  Social History   Social History  . Marital status: Single    Spouse name: N/A  . Number of children: N/A  . Years of  education: N/A   Occupational History  . Not on file.   Social History Main Topics  . Smoking status: Current Every Day Smoker    Packs/day: 1.00    Years: 28.00    Types: Cigarettes  . Smokeless tobacco: Never Used  . Alcohol use No     Comment: last marijuana was a year ago    weekend alcohol  . Drug use: Yes    Types: Marijuana  . Sexual activity: Not on file   Other Topics Concern  . Not on file   Social History Narrative  . No narrative on file    Review of Systems: ROS is O/W negative except as mentioned in HPI.  Physical Exam: Vital signs in last 24 hours: Temp:  [99.7 F (37.6 C)] 99.7 F (37.6 C) (07/20 1550) Pulse Rate:  [83-86] 83 (07/20 1550) Resp:  [16-20] 16 (07/20 1550) BP: (124-154)/(90-91) 124/91 (07/20 1550) SpO2:  [94 %-100 %] 94 % (07/20 1550)   General:  Alert, Well-developed, well-nourished, pleasant and cooperative in NAD; uncomfortable. Head:  Normocephalic and atraumatic.  Eyes:  Sclera clear, no icterus.  Conjunctiva pink. Ears:  Normal auditory acuity. Mouth:  No deformity or lesions.   Lungs:  Clear throughout to auscultation.  No wheezes, crackles, or rhonchi.  Heart:  Regular rate and rhythm; no murmurs, clicks, rubs, or gallops. Abdomen:  Soft, non-distended.  BS present.  Moderate upper abdominal TTP. Rectal:  Deferred  Msk:  Symmetrical without gross deformities. Pulses:  Normal pulses noted. Extremities:  Without clubbing or edema. Neurologic:  Alert and oriented x 4;  grossly normal neurologically. Skin:  Intact without significant lesions or rashes. Psych:  Alert and cooperative. Normal mood and affect.  Intake/Output this shift: Total I/O In: 1000 [IV Piggyback:1000] Out: -   Lab Results:  Recent Labs  09/07/16 1404  WBC 14.2*  HGB 10.2*  HCT 35.0*  PLT 367   BMET  Recent Labs  09/07/16 1404  NA 140  K 3.1*  CL 101  CO2 25  GLUCOSE 141*  BUN 29*  CREATININE 0.67  CALCIUM 8.9   LFT  Recent Labs   09/07/16 1404  PROT 8.0  ALBUMIN 4.2  AST 23  ALT 13*  ALKPHOS 68  BILITOT 0.5   Studies/Results: Ct Abdomen Pelvis W Contrast  Result Date: 09/07/2016 CLINICAL DATA:  Patient states he has been having N/V and severe abdominal pain staring last night. The vomit has been dark brown in color. Patient states this has been recurrent throughout the year with this being the worst so farPt.states mid abdominal pain x's 3 days, nausea, vomiting EXAM: CT ABDOMEN AND PELVIS WITH CONTRAST TECHNIQUE: Multidetector CT imaging of the abdomen and pelvis was performed using the standard protocol following bolus administration of intravenous contrast. CONTRAST:  100 mL of Isovue-300 intravenous contrast COMPARISON:  09/12/2012 FINDINGS: Lower chest: No acute abnormality. Hepatobiliary: No focal liver abnormality is seen. No gallstones, gallbladder wall thickening, or biliary dilatation. Pancreas: Unremarkable. No pancreatic ductal dilatation or surrounding inflammatory changes. Spleen: Normal in size without focal abnormality. Adrenals/Urinary Tract: No adrenal masses. 9 mm low-density left midpole renal cyst. No other renal masses or lesions. No stones. No hydronephrosis. Normal ureters. Normal bladder. Stomach/Bowel: There is wall thickening of the gastric antrum mild hazy opacity in the adjacent fat. This is consistent with gastritis. Small bowel: Unremarkable.  Appendix not visualized. Vascular/Lymphatic: Aortic atherosclerosis. No enlarged abdominal or pelvic lymph nodes. Reproductive: Unremarkable. Other: No abdominal wall hernia or abnormality. No abdominopelvic ascites. Musculoskeletal: No acute or significant osseous findings. IMPRESSION: 1. Wall thickening of the gastric antrum with adjacent inflammatory change. This is consistent with gastritis. Consider follow-up upper endoscopy or upper GI for further assessment. 2. No other evidence of an acute abnormality. 3. Small left renal cyst. 4. Aortic  atherosclerosis. Electronically Signed   By: Amie Portland M.D.   On: 09/07/2016 15:47   IMPRESSION:  -UGIB with hematemesis and CGE along with epigastric pain in the setting of excessive BC powder/Goody powder use.  CT scan showing inflammation in stomach c/w gastritis.  BUN elevated. -Anemia, microcytic:  May be due to chronic occult blood loss.  PLAN: -NPO. -Monitor Hgb and transfuse if needed. -Check iron studies. -PPI gtt. -EGD on 7/21.  Kamoni Gentles D.  09/07/2016, 4:10 PM  Pager number [phone]

## 2016-09-07 NOTE — H&P (Signed)
[facility] History and Physical  Austin [name] ZOX:[id number] DOB: [birth year] DOA: 09/07/2016  Referring physician: ED  PCP: Patient, No Pcp Per   Chief Complaint: Hematemesis HPI:  51 year old male with a history of gastroesophageal reflux disease on Prilosec, chronic headaches for which he takes BC powder at least 4 or 5 times a day, presents to the ER today with epigastric pain associated with multiple episodes of hematemesis overnight described as bright red blood with projectile vomiting. Patient has noticed 15-20 pound weight loss in the last 1 year. He has also noticed early satiety with abdominal cramping in the epigastric region after he eats a meal. He has developed an aversion for food and tries to  minimize oral intake if and when he has abdominal pain. Denies drinking alcohol ED course  BP (!) 154/90   Pulse 86   Resp 20   SpO2 100%  Hemoglobin was found to be 10.2. Patient has a potassium of 3.1. CT abdomen pelvis shows wall thickening of the gastric antrum consistent with gastritis. Patient started on Protonix drip, [facility] consulted. Patient scheduled for EGD tomorrow morning Patient being admitted to step down    Review of Systems: negative for the following   A complete 12 point review of systems was found to be negative except for pertinent positives are documented below Constitutional: Positive for appetite change, chills, fatigue and unexpected weight change.  Gastrointestinal: Positive for abdominal pain, nausea and vomiting.  Neurological: Positive for weakness.    Past Medical History:  Diagnosis Date  . Anxiety   . GERD (gastroesophageal reflux disease)   . Headache(784.0)    hx of migraines-none recent  . History of kidney stones 09/19/12   left ureteral stone-SURGERY 8/6 URETEROSCOPY / STENT PLACEMENT      Past Surgical History:  Procedure Laterality Date  . CYSTO, LEFT RGP, DIAGNOSTIC URETEROSCOPY AND STENT PLACEMENT  09/24/2012  .  CYSTOSCOPY WITH RETROGRADE PYELOGRAM, URETEROSCOPY AND STENT PLACEMENT Left 09/24/2012   Procedure: CYSTOSCOPY WITH RETROGRADE PYELOGRAM, DIAGNOSTIC URETEROSCOPY AND STENT PLACEMENT;  Surgeon: Sebastian Ache, MD;  Location: [facility];  Service: Urology;  Laterality: Left;  . CYSTOSCOPY WITH RETROGRADE PYELOGRAM, URETEROSCOPY AND STENT PLACEMENT Left 10/08/2012   Procedure: CYSTOSCOPY WITH RETROGRADE PYELOGRAM, URETEROSCOPY AND STENT PLACEMENT;  Surgeon: Sebastian Ache, MD;  Location: [facility];  Service: Urology;  Laterality: Left;  1 HR NEEDS DIGITAL URETEROSCOPE   . HOLMIUM LASER APPLICATION Left 10/08/2012   Procedure: HOLMIUM LASER APPLICATION;  Surgeon: Sebastian Ache, MD;  Location: [facility];  Service: Urology;  Laterality: Left;  . TONSILLECTOMY     t&a as a child  . wisdom teeth extracted        Social History:  reports that he has been smoking Cigarettes.  He has a 28.00 pack-year smoking history. He has never used smokeless tobacco. He reports that he uses drugs, including Marijuana. He reports that he does not drink alcohol.   No Known Allergies      FAMILY HISTORY  When questioned  Directly-patient reports  No family history of HTN, CVA ,DIABETES, TB, Cancer CAD, Bleeding Disorders, Sickle Cell, diabetes, anemia, asthma,   Prior to Admission medications   Medication Sig Start Date End Date Taking? Authorizing Provider  Aspirin-Acetaminophen-Caffeine (GOODY HEADACHE PO) Take 1 packet by mouth every 8 (eight) hours as needed (headaches).    Yes [provider]  omeprazole (PRILOSEC) 20 MG capsule Take 20 mg by mouth daily.   Yes [provider]  HYDROcodone-acetaminophen (NORCO/VICODIN) 5-325  MG per tablet Take 1-2 tablets by mouth every 6 (six) hours as needed for pain. Patient not taking: Reported on 09/07/2016 10/08/12   Sebastian Ache, MD  ibuprofen (ADVIL,MOTRIN) 800 MG tablet Take 1 tablet (800 mg total) by mouth every 8 (eight) hours as needed for pain. Patient  not taking: Reported on 09/07/2016 09/11/12   Ethelda Chick, MD  oxybutynin (DITROPAN) 5 MG tablet Take 1 tablet (5 mg total) by mouth every 8 (eight) hours as needed. For bladder spasms / stent discomfort. Patient not taking: Reported on 09/07/2016 09/24/12   Sebastian Ache, MD  senna-docusate (SENOKOT-S) 8.6-50 MG per tablet Take 1 tablet by mouth 2 (two) times daily. While taking pain meds to prevent constipation Patient not taking: Reported on 09/07/2016 09/24/12   Sebastian Ache, MD  sulfamethoxazole-trimethoprim (BACTRIM DS) 800-160 MG per tablet Take 1 tablet by mouth daily. X 3 days. Begin day prior to next Urology appointment. Patient not taking: Reported on 09/07/2016 10/08/12   Sebastian Ache, MD  tamsulosin (FLOMAX) 0.4 MG CAPS capsule Take 1 capsule (0.4 mg total) by mouth daily. For stent and stone discomfort Patient not taking: Reported on 09/07/2016 09/24/12   Sebastian Ache, MD     Physical Exam: Vitals:   09/07/16 1341 09/07/16 1550  BP: (!) 154/90 (!) 124/91  Pulse: 86 83  Resp: 20 16  Temp:  99.7 F (37.6 C)  TempSrc:  Oral  SpO2: 100% 94%        Vitals:   09/07/16 1341 09/07/16 1550  BP: (!) 154/90 (!) 124/91  Pulse: 86 83  Resp: 20 16  Temp:  99.7 F (37.6 C)  TempSrc:  Oral  SpO2: 100% 94%   Constitutional: Cachectic Eyes: PERRL, lids and conjunctivae normal ENMT: Mucous membranes are moist. Posterior pharynx clear of any exudate or lesions.Normal dentition.  Neck: normal, supple, no masses, no thyromegaly Respiratory: clear to auscultation bilaterally, no wheezing, no crackles. Normal respiratory effort. No accessory muscle use.  Cardiovascular: Regular rate and rhythm, no murmurs / rubs / gallops. No extremity edema. 2+ pedal pulses. No carotid bruits.  Abdomen:Normal appearance. He exhibits no distension. There is tenderness in the epigastric area. There is guarding Musculoskeletal: no clubbing / cyanosis. No joint deformity upper and lower extremities.  Good ROM, no contractures. Normal muscle tone.  Skin: no rashes, lesions, ulcers. No induration Neurologic: CN 2-12 grossly intact. Sensation intact, DTR normal. Strength 5/5 in all 4.  Psychiatric: Normal judgment and insight. Alert and oriented x 3. Normal mood.     Labs on Admission: I have personally reviewed following labs and imaging studies  CBC:  Recent Labs Lab 09/07/16 1404  WBC 14.2*  HGB 10.2*  HCT 35.0*  MCV 70.9*  PLT 367    Basic Metabolic Panel:  Recent Labs Lab 09/07/16 1404  NA 140  K 3.1*  CL 101  CO2 25  GLUCOSE 141*  BUN 29*  CREATININE 0.67  CALCIUM 8.9    GFR: CrCl cannot be calculated (Unknown ideal weight.).  Liver Function Tests:  Recent Labs Lab 09/07/16 1404  AST 23  ALT 13*  ALKPHOS 68  BILITOT 0.5  PROT 8.0  ALBUMIN 4.2    Recent Labs Lab 09/07/16 1404  LIPASE 19   No results for input(s): AMMONIA in the last 168 hours.  Coagulation Profile: No results for input(s): INR, PROTIME in the last 168 hours. No results for input(s): DDIMER in the last 72 hours.  Cardiac Enzymes: No results for input(s):  CKTOTAL, CKMB, CKMBINDEX, TROPONINI in the last 168 hours.  BNP (last 3 results) No results for input(s): PROBNP in the last 8760 hours.  HbA1C: No results for input(s): HGBA1C in the last 72 hours. No results found for: HGBA1C   CBG: No results for input(s): GLUCAP in the last 168 hours.  Lipid Profile: No results for input(s): CHOL, HDL, LDLCALC, TRIG, CHOLHDL, LDLDIRECT in the last 72 hours.  Thyroid Function Tests: No results for input(s): TSH, T4TOTAL, FREET4, T3FREE, THYROIDAB in the last 72 hours.  Anemia Panel: No results for input(s): VITAMINB12, FOLATE, FERRITIN, TIBC, IRON, RETICCTPCT in the last 72 hours.  Urine analysis:    Component Value Date/Time   COLORURINE YELLOW 07/04/2010 1515   APPEARANCEUR CLEAR 07/04/2010 1515   LABSPEC 1.031 (H) 07/04/2010 1515   PHURINE 5.5 07/04/2010 1515    GLUCOSEU NEGATIVE 07/04/2010 1515   HGBUR NEGATIVE 07/04/2010 1515   BILIRUBINUR neg 09/11/2012 2009   KETONESUR 40 (A) 07/04/2010 1515   PROTEINUR neg 09/11/2012 2009   PROTEINUR NEGATIVE 07/04/2010 1515   UROBILINOGEN 0.2 09/11/2012 2009   UROBILINOGEN 0.2 07/04/2010 1515   NITRITE neg 09/11/2012 2009   NITRITE NEGATIVE 07/04/2010 1515   LEUKOCYTESUR Negative 09/11/2012 2009    Sepsis Labs: @LABRCNTIP (procalcitonin:4,lacticidven:4) )No results found for this or any previous visit (from the past 240 hour(s)).       Radiological Exams on Admission: Ct Abdomen Pelvis W Contrast  Result Date: 09/07/2016 CLINICAL DATA:  Patient states he has been having N/V and severe abdominal pain staring last night. The vomit has been dark brown in color. Patient states this has been recurrent throughout the year with this being the worst so farPt.states mid abdominal pain x's 3 days, nausea, vomiting EXAM: CT ABDOMEN AND PELVIS WITH CONTRAST TECHNIQUE: Multidetector CT imaging of the abdomen and pelvis was performed using the standard protocol following bolus administration of intravenous contrast. CONTRAST:  100 mL of Isovue-300 intravenous contrast COMPARISON:  09/12/2012 FINDINGS: Lower chest: No acute abnormality. Hepatobiliary: No focal liver abnormality is seen. No gallstones, gallbladder wall thickening, or biliary dilatation. Pancreas: Unremarkable. No pancreatic ductal dilatation or surrounding inflammatory changes. Spleen: Normal in size without focal abnormality. Adrenals/Urinary Tract: No adrenal masses. 9 mm low-density left midpole renal cyst. No other renal masses or lesions. No stones. No hydronephrosis. Normal ureters. Normal bladder. Stomach/Bowel: There is wall thickening of the gastric antrum mild hazy opacity in the adjacent fat. This is consistent with gastritis. Small bowel: Unremarkable.  Appendix not visualized. Vascular/Lymphatic: Aortic atherosclerosis. No enlarged abdominal or  pelvic lymph nodes. Reproductive: Unremarkable. Other: No abdominal wall hernia or abnormality. No abdominopelvic ascites. Musculoskeletal: No acute or significant osseous findings. IMPRESSION: 1. Wall thickening of the gastric antrum with adjacent inflammatory change. This is consistent with gastritis. Consider follow-up upper endoscopy or upper GI for further assessment. 2. No other evidence of an acute abnormality. 3. Small left renal cyst. 4. Aortic atherosclerosis. Electronically Signed   By: Amie Portland M.D.   On: 09/07/2016 15:47   Ct Abdomen Pelvis W Contrast  Result Date: 09/07/2016 CLINICAL DATA:  Patient states he has been having N/V and severe abdominal pain staring last night. The vomit has been dark brown in color. Patient states this has been recurrent throughout the year with this being the worst so farPt.states mid abdominal pain x's 3 days, nausea, vomiting EXAM: CT ABDOMEN AND PELVIS WITH CONTRAST TECHNIQUE: Multidetector CT imaging of the abdomen and pelvis was performed using the standard protocol following bolus  administration of intravenous contrast. CONTRAST:  100 mL of Isovue-300 intravenous contrast COMPARISON:  09/12/2012 FINDINGS: Lower chest: No acute abnormality. Hepatobiliary: No focal liver abnormality is seen. No gallstones, gallbladder wall thickening, or biliary dilatation. Pancreas: Unremarkable. No pancreatic ductal dilatation or surrounding inflammatory changes. Spleen: Normal in size without focal abnormality. Adrenals/Urinary Tract: No adrenal masses. 9 mm low-density left midpole renal cyst. No other renal masses or lesions. No stones. No hydronephrosis. Normal ureters. Normal bladder. Stomach/Bowel: There is wall thickening of the gastric antrum mild hazy opacity in the adjacent fat. This is consistent with gastritis. Small bowel: Unremarkable.  Appendix not visualized. Vascular/Lymphatic: Aortic atherosclerosis. No enlarged abdominal or pelvic lymph nodes.  Reproductive: Unremarkable. Other: No abdominal wall hernia or abnormality. No abdominopelvic ascites. Musculoskeletal: No acute or significant osseous findings. IMPRESSION: 1. Wall thickening of the gastric antrum with adjacent inflammatory change. This is consistent with gastritis. Consider follow-up upper endoscopy or upper GI for further assessment. 2. No other evidence of an acute abnormality. 3. Small left renal cyst. 4. Aortic atherosclerosis. Electronically Signed   By: Amie Portland M.D.   On: 09/07/2016 15:47      EKG: Independently reviewed.    Assessment/Plan Principal Problem:   GI bleed/hematemesis Patient will be admitted to step down Started on PPI drip EGD scheduled for tomorrow morning Per gastroenterology, patient needs to be npo    Acute blood loss anemia Baseline hemoglobin unknown Will add anemia panel Serial CBC  Smoker Smoking cessation counseling done   DVT prophylaxis: SCDs     Code Status Orders Full code            consults called: GI  Family Communication: Admission, patients condition and plan of care including tests being ordered have been discussed with the patient  who indicates understanding and agree with the plan and Code Status  Admission status: inpatient    Disposition plan: Further plan will depend as patient's clinical course evolves and further radiologic and laboratory data become available. Likely home when stable   At the time of admission, it appears that the appropriate admission status for this patient is INPATIENT .Thisis judged to be reasonable and necessary in order to provide the required intensity of service to ensure the patient's safetygiven thepresenting symptoms, physical exam findings, and initial radiographic and laboratory data in the context of their chronic comorbidities.   Richarda Overlie MD [facility] Pager [phone]  If 7PM-7AM, please contact night-coverage [email or web address] Password  [facility]  09/07/2016, 4:34 PM

## 2016-09-07 NOTE — ED Notes (Signed)
Patient transported to CT 

## 2016-09-07 NOTE — ED Notes (Signed)
Bed: PG98 Expected date:  Expected time:  Means of arrival:  Comments: EMS 19 or 22

## 2016-09-07 NOTE — ED Notes (Signed)
Verified with pharmacy that potassium runs are compatible as an IVPB off of the protonix drip.

## 2016-09-08 ENCOUNTER — Encounter (HOSPITAL_COMMUNITY)
Admission: EM | Disposition: A | Discharge status: Home / Self Care | Payer: Self-pay | Source: Home / Self Care | Attending: Internal Medicine

## 2016-09-08 ENCOUNTER — Encounter (HOSPITAL_COMMUNITY): Payer: Self-pay

## 2016-09-08 DIAGNOSIS — K315 Obstruction of duodenum: Secondary | ICD-10-CM

## 2016-09-08 HISTORY — PX: ESOPHAGOGASTRODUODENOSCOPY: SHX5428

## 2016-09-08 LAB — CBC
HEMATOCRIT: 30.6 % — AB (ref 39.0–52.0)
HEMOGLOBIN: 8.6 g/dL — AB (ref 13.0–17.0)
MCH: 20.2 pg — ABNORMAL LOW (ref 26.0–34.0)
MCHC: 28.1 g/dL — AB (ref 30.0–36.0)
MCV: 72 fL — ABNORMAL LOW (ref 78.0–100.0)
Platelets: 312 10*3/uL (ref 150–400)
RBC: 4.25 MIL/uL (ref 4.22–5.81)
RDW: 17.7 % — ABNORMAL HIGH (ref 11.5–15.5)
WBC: 11.8 10*3/uL — ABNORMAL HIGH (ref 4.0–10.5)

## 2016-09-08 LAB — COMPREHENSIVE METABOLIC PANEL
ALBUMIN: 3.5 g/dL (ref 3.5–5.0)
ALT: 11 U/L — ABNORMAL LOW (ref 17–63)
ANION GAP: 9 (ref 5–15)
AST: 19 U/L (ref 15–41)
Alkaline Phosphatase: 58 U/L (ref 38–126)
BUN: 23 mg/dL — AB (ref 6–20)
CHLORIDE: 105 mmol/L (ref 101–111)
CO2: 26 mmol/L (ref 22–32)
Calcium: 8.6 mg/dL — ABNORMAL LOW (ref 8.9–10.3)
Creatinine, Ser: 0.64 mg/dL (ref 0.61–1.24)
GFR calc Af Amer: >60 mL/min (ref >60)
GFR calc non Af Amer: >60 mL/min (ref >60)
GLUCOSE: 109 mg/dL — AB (ref 65–99)
POTASSIUM: 3.4 mmol/L — AB (ref 3.5–5.1)
SODIUM: 140 mmol/L (ref 135–145)
Total Bilirubin: 0.3 mg/dL (ref 0.3–1.2)
Total Protein: 6.9 g/dL (ref 6.5–8.1)

## 2016-09-08 LAB — HIV ANTIBODY (ROUTINE TESTING W REFLEX): HIV Screen 4th Generation wRfx: NONREACTIVE

## 2016-09-08 LAB — MAGNESIUM: MAGNESIUM: 1.7 mg/dL (ref 1.7–2.4)

## 2016-09-08 SURGERY — EGD (ESOPHAGOGASTRODUODENOSCOPY)
Anesthesia: Moderate Sedation

## 2016-09-08 MED ORDER — MAGNESIUM SULFATE 2 GM/50ML IV SOLN
2.0000 g | Freq: Once | INTRAVENOUS | Status: AC
  Administered 2016-09-08: 2 g via INTRAVENOUS
  Filled 2016-09-08: qty 50

## 2016-09-08 MED ORDER — DIPHENHYDRAMINE HCL 50 MG/ML IJ SOLN
INTRAMUSCULAR | Status: AC
  Filled 2016-09-08: qty 1

## 2016-09-08 MED ORDER — FENTANYL CITRATE (PF) 100 MCG/2ML IJ SOLN
INTRAMUSCULAR | Status: DC | PRN
  Administered 2016-09-08 (×2): 25 ug via INTRAVENOUS
  Administered 2016-09-08: 12.5 ug via INTRAVENOUS
  Administered 2016-09-08: 25 ug via INTRAVENOUS

## 2016-09-08 MED ORDER — MIDAZOLAM HCL 10 MG/2ML IJ SOLN
INTRAMUSCULAR | Status: DC | PRN
  Administered 2016-09-08 (×3): 1 mg via INTRAVENOUS
  Administered 2016-09-08 (×2): 2 mg via INTRAVENOUS
  Administered 2016-09-08: 1 mg via INTRAVENOUS

## 2016-09-08 MED ORDER — DIPHENHYDRAMINE HCL 50 MG/ML IJ SOLN
INTRAMUSCULAR | Status: DC | PRN
  Administered 2016-09-08 (×2): 25 mg via INTRAVENOUS

## 2016-09-08 MED ORDER — NICOTINE 21 MG/24HR TD PT24
21.0000 mg | MEDICATED_PATCH | Freq: Every day | TRANSDERMAL | Status: DC
  Administered 2016-09-08 – 2016-09-11 (×4): 21 mg via TRANSDERMAL
  Filled 2016-09-08 (×4): qty 1

## 2016-09-08 MED ORDER — MIDAZOLAM HCL 5 MG/ML IJ SOLN
INTRAMUSCULAR | Status: AC
  Filled 2016-09-08: qty 2

## 2016-09-08 MED ORDER — SODIUM CHLORIDE 0.9 % IV SOLN: INTRAVENOUS | Status: DC

## 2016-09-08 MED ORDER — FENTANYL CITRATE (PF) 100 MCG/2ML IJ SOLN
INTRAMUSCULAR | Status: AC
  Filled 2016-09-08: qty 2

## 2016-09-08 MED ORDER — POTASSIUM CHLORIDE IN NACL 40-0.9 MEQ/L-% IV SOLN
INTRAVENOUS | Status: DC
  Administered 2016-09-08: 75 mL/h via INTRAVENOUS
  Administered 2016-09-09: 50 mL/h via INTRAVENOUS
  Administered 2016-09-09: 75 mL/h via INTRAVENOUS
  Filled 2016-09-08 (×4): qty 1000

## 2016-09-08 NOTE — Interval H&P Note (Signed)
History and Physical Interval Note:  09/08/2016 8:05 AM  Austin [name]  has presented today for surgery, with the diagnosis of UGIB  The various methods of treatment have been discussed with the patient and family. After consideration of risks, benefits and other options for treatment, the patient has consented to  Procedure(s): ESOPHAGOGASTRODUODENOSCOPY (EGD) (N/A) as a surgical intervention .  The patient's history has been reviewed, patient examined, no change in status, stable for surgery.  I have reviewed the patient's chart and labs.  Questions were answered to the patient's satisfaction.     Austin [name]

## 2016-09-08 NOTE — Op Note (Signed)
[facility] Patient Name: Austin [name] Procedure Date: 09/08/2016 MRN: [id number] Attending MD: Willaim Rayas. Zelma Mazariego MD, MD Date of Birth: 08-Jul-1965 CSN: [id number] Age: 51 Admit Type: Inpatient Procedure:                Upper GI endoscopy Indications:              Epigastric abdominal pain, Hematemesis, significant                            NSAID use Providers:                Willaim Rayas. Kadie Balestrieri MD, MD, Omelia Blackwater RN,                            RN, Harrington Challenger, Technician Referring MD:              Medicines:                Fentanyl 87.5 micrograms IV, Midazolam 8 mg IV,                            Diphenhydramine 50 mg IV Complications:            No immediate complications. Estimated blood loss:                            Minimal. Estimated Blood Loss:     Estimated blood loss was minimal. Procedure:                Pre-Anesthesia Assessment:                           - Prior to the procedure, a History and Physical                            was performed, and patient medications and                            allergies were reviewed. The patient's tolerance of                            previous anesthesia was also reviewed. The risks                            and benefits of the procedure and the sedation                            options and risks were discussed with the patient.                            All questions were answered, and informed consent                            was obtained. Prior Anticoagulants: The patient has  taken previous NSAID medication, last dose was 1                            day prior to procedure. ASA Grade Assessment: III -                            A patient with severe systemic disease. After                            reviewing the risks and benefits, the patient was                            deemed in satisfactory condition to undergo the                            procedure.                     After obtaining informed consent, the endoscope was                            passed under direct vision. Throughout the                            procedure, the patient's blood pressure, pulse, and                            oxygen saturations were monitored continuously. The                            Endoscope was introduced through the mouth, and                            advanced to the duodenal bulb. The upper GI                            endoscopy was accomplished without difficulty. The                            patient tolerated the procedure well. Scope In: Scope Out: Findings:      Esophagogastric landmarks were identified: the Z-line was found at 42       cm, the gastroesophageal junction was found at 42 cm and the upper       extent of the gastric folds was found at 42 cm from the incisors.      The exam of the esophagus was otherwise normal.      One non-bleeding cratered gastric ulcer with a suspected visible vessel       was found at the pylorus. The lesion was roughly 8-10 mm in largest       dimension. Fulguration to ablate the vessel to prevent bleeding by       monopolar probe was successful.      The exam of the stomach was otherwise normal.      Biopsies were taken with a cold forceps in the gastric body and in the  gastric antrum for Helicobacter pylori testing.      One non-bleeding cratered duodenal ulcer with no stigmata of bleeding       was found in the duodenal bulb. The lesion was 7 mm in largest dimension.      An acquired benign-appearing, intrinsic severe stenosis was found in the       duodenal bulb from edema related to the ulcer. It was not able to be       traversed. Impression:               - Esophagogastric landmarks identified.                           - Normal esophagus                           - Non-bleeding gastric ulcer with a visible vessel.                            Treated with a monopolar probe.                            - One non-bleeding duodenal ulcer with no stigmata                            of bleeding.                           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                           - Biopsies were taken with a cold forceps for                            Helicobacter pylori testing. Moderate Sedation:      Moderate (conscious) sedation was administered by the endoscopy nurse       and supervised by the endoscopist. The following parameters were       monitored: oxygen saturation, heart rate, blood pressure, and response       to care. Total physician intraservice time was 44 minutes. Recommendation:           - Return patient to hospital ward for ongoing care.                           - NPO for today, clear liquids tomorrow                           - NO NSAIDs                           - Continue present medications (IV protonix for 72  hours post endoscopic therapy)                           - Await pathology results.                           - GI service will follow                           - Repeat upper endoscopy in 3 months for                            reassessment of ulcers Procedure Code(s):        --- Professional ---                           43255, 59, Esophagogastroduodenoscopy, flexible,                            transoral; with control of bleeding, any method                           43239, Esophagogastroduodenoscopy, flexible,                            transoral; with biopsy, single or multiple                           99152, Moderate sedation services provided by the                            same physician or other qualified health care                            professional performing the diagnostic or                            therapeutic service that the sedation supports,                            requiring the presence of an  independent trained                            observer to assist in the monitoring of the                            patient's level of consciousness and physiological                            status; initial 15 minutes of intraservice time,                            patient age 92 years or older                           [phone], Moderate sedation services; each  additional                            15 minutes intraservice time                           99153, Moderate sedation services; each additional                            15 minutes intraservice time Diagnosis Code(s):        --- Professional ---                           K25.4, Chronic or unspecified gastric ulcer with                            hemorrhage                           K26.9, Duodenal ulcer, unspecified as acute or                            chronic, without hemorrhage or perforation                           K31.5, Obstruction of duodenum                           R10.13, Epigastric pain                           K92.0, Hematemesis CPT copyright 2016 [employer]. All rights reserved. The codes documented in this report are preliminary and upon coder review may  be revised to meet current compliance requirements. Viviann Spare P. Suheily Birks MD, MD 09/08/2016 9:01:27 AM This report has been signed electronically. Number of Addenda: 0

## 2016-09-08 NOTE — Progress Notes (Signed)
12.5 mcg of Fentanyl wasted, along with 2mg  of Versed wasted in sink by Omelia Blackwater, RN.  Witnessed by Pierre Bali, RN  Omelia Blackwater, RN

## 2016-09-08 NOTE — Progress Notes (Signed)
Witnessed waste of 12.5 mcg of Fentanyl, along with 2mg  of Versed in the sink by Omelia Blackwater, RN.  Pierre Bali, RN

## 2016-09-08 NOTE — H&P (View-Only) (Signed)
 Referring Provider: Dr. Isaacs Primary Care Physician:  Patient, No Pcp Per Primary Gastroenterologist:  Unassigned  Reason for Consultation:  UGIB  HPI: Austin [name] is a 51 y.o. male with limited past medical history who presented to [facility] ED with complaints of upper abdominal pain and vomiting blood.  Uses at least 2 BC or Goody powders per day for several years.  Has been experiencing upper abdominal pain on and off for a few months.  Has also had intermittent vomiting, sometimes with very small amounts of blood, but much worse and more frequent recently.  Overnight he had a lot of vomiting with large amounts of blood and having a lot of abdominal pain.  Upon presentation to the ED Hgb is 10.2 grams with MCV of 71.  Last was 3 years ago and was 14.9 at that time.  BUN elevated at 29.  CT scan showed wall thickening of the gastric antrum with adjacent inflammatory change c/w gastritis.  Never had GI evaluation in the past.  Has been on PPI gtt.   Past Medical History:  Diagnosis Date  . Anxiety   . GERD (gastroesophageal reflux disease)   . Headache(784.0)    hx of migraines-none recent  . History of kidney stones 09/19/12   left ureteral stone-SURGERY 8/6 URETEROSCOPY / STENT PLACEMENT     Past Surgical History:  Procedure Laterality Date  . CYSTO, LEFT RGP, DIAGNOSTIC URETEROSCOPY AND STENT PLACEMENT  09/24/2012  . CYSTOSCOPY WITH RETROGRADE PYELOGRAM, URETEROSCOPY AND STENT PLACEMENT Left 09/24/2012   Procedure: CYSTOSCOPY WITH RETROGRADE PYELOGRAM, DIAGNOSTIC URETEROSCOPY AND STENT PLACEMENT;  Surgeon: Theodore Manny, MD;  Location: [facility];  Service: Urology;  Laterality: Left;  . CYSTOSCOPY WITH RETROGRADE PYELOGRAM, URETEROSCOPY AND STENT PLACEMENT Left 10/08/2012   Procedure: CYSTOSCOPY WITH RETROGRADE PYELOGRAM, URETEROSCOPY AND STENT PLACEMENT;  Surgeon: Theodore Manny, MD;  Location: [facility];  Service: Urology;  Laterality: Left;  1 HR NEEDS DIGITAL URETEROSCOPE   . HOLMIUM LASER  APPLICATION Left 10/08/2012   Procedure: HOLMIUM LASER APPLICATION;  Surgeon: Theodore Manny, MD;  Location: [facility];  Service: Urology;  Laterality: Left;  . TONSILLECTOMY     t&a as a child  . wisdom teeth extracted      Prior to Admission medications   Medication Sig Start Date End Date Taking? Authorizing Provider  Aspirin-Acetaminophen-Caffeine (GOODY HEADACHE PO) Take 1 packet by mouth every 8 (eight) hours as needed (headaches).    Yes [provider]  omeprazole (PRILOSEC) 20 MG capsule Take 20 mg by mouth daily.   Yes [provider]  HYDROcodone-acetaminophen (NORCO/VICODIN) 5-325 MG per tablet Take 1-2 tablets by mouth every 6 (six) hours as needed for pain. Patient not taking: Reported on 09/07/2016 10/08/12   Manny, Theodore, MD  ibuprofen (ADVIL,MOTRIN) 800 MG tablet Take 1 tablet (800 mg total) by mouth every 8 (eight) hours as needed for pain. Patient not taking: Reported on 09/07/2016 09/11/12   Smith, Kristi M, MD  oxybutynin (DITROPAN) 5 MG tablet Take 1 tablet (5 mg total) by mouth every 8 (eight) hours as needed. For bladder spasms / stent discomfort. Patient not taking: Reported on 09/07/2016 09/24/12   Manny, Theodore, MD  senna-docusate (SENOKOT-S) 8.6-50 MG per tablet Take 1 tablet by mouth 2 (two) times daily. While taking pain meds to prevent constipation Patient not taking: Reported on 09/07/2016 09/24/12   Manny, Theodore, MD  sulfamethoxazole-trimethoprim (BACTRIM DS) 800-160 MG per tablet Take 1 tablet by mouth daily. X 3 days. Begin day prior   to next Urology appointment. Patient not taking: Reported on 09/07/2016 10/08/12   Manny, Theodore, MD  tamsulosin (FLOMAX) 0.4 MG CAPS capsule Take 1 capsule (0.4 mg total) by mouth daily. For stent and stone discomfort Patient not taking: Reported on 09/07/2016 09/24/12   Manny, Theodore, MD    Current Facility-Administered Medications  Medication Dose Route Frequency Provider Last Rate Last Dose  . pantoprazole  (PROTONIX) 80 mg in sodium chloride 0.9 % 250 mL (0.32 mg/mL) infusion  8 mg/hr Intravenous Continuous Isaacs, Cameron, MD 25 mL/hr at 09/07/16 1540 8 mg/hr at 09/07/16 1540  . [START ON 09/11/2016] pantoprazole (PROTONIX) injection 40 mg  40 mg Intravenous Q12H Isaacs, Cameron, MD       Current Outpatient Prescriptions  Medication Sig Dispense Refill  . Aspirin-Acetaminophen-Caffeine (GOODY HEADACHE PO) Take 1 packet by mouth every 8 (eight) hours as needed (headaches).     . omeprazole (PRILOSEC) 20 MG capsule Take 20 mg by mouth daily.    . HYDROcodone-acetaminophen (NORCO/VICODIN) 5-325 MG per tablet Take 1-2 tablets by mouth every 6 (six) hours as needed for pain. (Patient not taking: Reported on 09/07/2016) 30 tablet 1  . ibuprofen (ADVIL,MOTRIN) 800 MG tablet Take 1 tablet (800 mg total) by mouth every 8 (eight) hours as needed for pain. (Patient not taking: Reported on 09/07/2016) 30 tablet 0  . oxybutynin (DITROPAN) 5 MG tablet Take 1 tablet (5 mg total) by mouth every 8 (eight) hours as needed. For bladder spasms / stent discomfort. (Patient not taking: Reported on 09/07/2016) 20 tablet 2  . senna-docusate (SENOKOT-S) 8.6-50 MG per tablet Take 1 tablet by mouth 2 (two) times daily. While taking pain meds to prevent constipation (Patient not taking: Reported on 09/07/2016) 30 tablet 1  . sulfamethoxazole-trimethoprim (BACTRIM DS) 800-160 MG per tablet Take 1 tablet by mouth daily. X 3 days. Begin day prior to next Urology appointment. (Patient not taking: Reported on 09/07/2016) 3 tablet 0  . tamsulosin (FLOMAX) 0.4 MG CAPS capsule Take 1 capsule (0.4 mg total) by mouth daily. For stent and stone discomfort (Patient not taking: Reported on 09/07/2016) 30 capsule 0    Allergies as of 09/07/2016  . (No Known Allergies)    History reviewed. No pertinent family history.  Social History   Social History  . Marital status: Single    Spouse name: N/A  . Number of children: N/A  . Years of  education: N/A   Occupational History  . Not on file.   Social History Main Topics  . Smoking status: Current Every Day Smoker    Packs/day: 1.00    Years: 28.00    Types: Cigarettes  . Smokeless tobacco: Never Used  . Alcohol use No     Comment: last marijuana was a year ago    weekend alcohol  . Drug use: Yes    Types: Marijuana  . Sexual activity: Not on file   Other Topics Concern  . Not on file   Social History Narrative  . No narrative on file    Review of Systems: ROS is O/W negative except as mentioned in HPI.  Physical Exam: Vital signs in last 24 hours: Temp:  [99.7 F (37.6 C)] 99.7 F (37.6 C) (07/20 1550) Pulse Rate:  [83-86] 83 (07/20 1550) Resp:  [16-20] 16 (07/20 1550) BP: (124-154)/(90-91) 124/91 (07/20 1550) SpO2:  [94 %-100 %] 94 % (07/20 1550)   General:  Alert, Well-developed, well-nourished, pleasant and cooperative in NAD; uncomfortable. Head:  Normocephalic and atraumatic.   Eyes:  Sclera clear, no icterus.  Conjunctiva pink. Ears:  Normal auditory acuity. Mouth:  No deformity or lesions.   Lungs:  Clear throughout to auscultation.  No wheezes, crackles, or rhonchi.  Heart:  Regular rate and rhythm; no murmurs, clicks, rubs, or gallops. Abdomen:  Soft, non-distended.  BS present.  Moderate upper abdominal TTP. Rectal:  Deferred  Msk:  Symmetrical without gross deformities. Pulses:  Normal pulses noted. Extremities:  Without clubbing or edema. Neurologic:  Alert and oriented x 4;  grossly normal neurologically. Skin:  Intact without significant lesions or rashes. Psych:  Alert and cooperative. Normal mood and affect.  Intake/Output this shift: Total I/O In: 1000 [IV Piggyback:1000] Out: -   Lab Results:  Recent Labs  09/07/16 1404  WBC 14.2*  HGB 10.2*  HCT 35.0*  PLT 367   BMET  Recent Labs  09/07/16 1404  NA 140  K 3.1*  CL 101  CO2 25  GLUCOSE 141*  BUN 29*  CREATININE 0.67  CALCIUM 8.9   LFT  Recent Labs   09/07/16 1404  PROT 8.0  ALBUMIN 4.2  AST 23  ALT 13*  ALKPHOS 68  BILITOT 0.5   Studies/Results: Ct Abdomen Pelvis W Contrast  Result Date: 09/07/2016 CLINICAL DATA:  Patient states he has been having N/V and severe abdominal pain staring last night. The vomit has been dark brown in color. Patient states this has been recurrent throughout the year with this being the worst so farPt.states mid abdominal pain x's 3 days, nausea, vomiting EXAM: CT ABDOMEN AND PELVIS WITH CONTRAST TECHNIQUE: Multidetector CT imaging of the abdomen and pelvis was performed using the standard protocol following bolus administration of intravenous contrast. CONTRAST:  100 mL of Isovue-300 intravenous contrast COMPARISON:  09/12/2012 FINDINGS: Lower chest: No acute abnormality. Hepatobiliary: No focal liver abnormality is seen. No gallstones, gallbladder wall thickening, or biliary dilatation. Pancreas: Unremarkable. No pancreatic ductal dilatation or surrounding inflammatory changes. Spleen: Normal in size without focal abnormality. Adrenals/Urinary Tract: No adrenal masses. 9 mm low-density left midpole renal cyst. No other renal masses or lesions. No stones. No hydronephrosis. Normal ureters. Normal bladder. Stomach/Bowel: There is wall thickening of the gastric antrum mild hazy opacity in the adjacent fat. This is consistent with gastritis. Small bowel: Unremarkable.  Appendix not visualized. Vascular/Lymphatic: Aortic atherosclerosis. No enlarged abdominal or pelvic lymph nodes. Reproductive: Unremarkable. Other: No abdominal wall hernia or abnormality. No abdominopelvic ascites. Musculoskeletal: No acute or significant osseous findings. IMPRESSION: 1. Wall thickening of the gastric antrum with adjacent inflammatory change. This is consistent with gastritis. Consider follow-up upper endoscopy or upper GI for further assessment. 2. No other evidence of an acute abnormality. 3. Small left renal cyst. 4. Aortic  atherosclerosis. Electronically Signed   By: David  Ormond M.D.   On: 09/07/2016 15:47   IMPRESSION:  -UGIB with hematemesis and CGE along with epigastric pain in the setting of excessive BC powder/Goody powder use.  CT scan showing inflammation in stomach c/w gastritis.  BUN elevated. -Anemia, microcytic:  May be due to chronic occult blood loss.  PLAN: -NPO. -Monitor Hgb and transfuse if needed. -Check iron studies. -PPI gtt. -EGD on 7/21.  Eknoor Novack D.  09/07/2016, 4:10 PM  Pager number [phone]     

## 2016-09-08 NOTE — Progress Notes (Signed)
[name], male   DOB: December 25, 1965, 51 y.o.   MRN: [id number]    PROGRESS NOTE    Austin [name]  NLG:[id number] DOB: 03/24/1965 DOA: 09/07/2016  PCP: Patient, No Pcp Per   Brief Narrative:  Pt is 51 yo male with known GERD, headaches for which he takes goody powders, presented with epigastric pain and hematemesis. Imaging studies notable for gastritis. Pt started on PPI and taken for EGD this am.  Assessment & Plan:   Principal Problem:   Upper GI bleed, hematemesis, acute blood loss anemia - EGD done this AM, notable for nonbleeding gastric ulcer with a visible vessel - EGD also notable for one nonbleeding duodenal ulcer with no stigmata of bleeding - Recommendation is to keep nothing by mouth today, clear liquids tomorrow - No NSAIDs - Continue IV Protonix for 72 hours post-endoscopic therapy - Follow-up on pathology results - Plan to repeat endoscopy in 3 months for reassessment of ulcers    Leukocytosis - Suspect this is reactive in the setting of the above - CBC in the morning    Hypokalemia and hypomagnesemia - Supplement both electrolytes and repeat blood work in the morning  DVT prophylaxis: SCDs Code Status: Full Family Communication: Patient at bedside  Disposition Plan: To be determined  Consultants:   GI  Procedures:   EGD 09/08/2016  Antimicrobials:   None  Subjective: Patient reports feeling better.  Objective: Vitals:   09/08/16 0840 09/08/16 0854 09/08/16 0900 09/08/16 0905  BP: (!) 106/54 (!) 116/92 122/69 128/74  Pulse:  72    Resp:  14  20  Temp:  97.8 F (36.6 C)    TempSrc:  Oral    SpO2:  100%    Weight:      Height:        Intake/Output Summary (Last 24 hours) at 09/08/16 0915 Last data filed at 09/08/16 0600  Gross per 24 hour  Intake             1625 ml  Output              500 ml  Net             1125 ml   Filed Weights   09/07/16 1730 09/08/16 0443  Weight: 66.6 kg (146 lb 13.2 oz) 67.6 kg (149 lb 0.5  oz)    Examination:  General exam: Appears calm and comfortable  Respiratory system: Clear to auscultation. Respiratory effort normal. Cardiovascular system: S1 & S2 heard, RRR. No JVD, murmurs, rubs, gallops or clicks. No pedal edema. Gastrointestinal system: Abdomen is nondistended, soft and nontender. No organomegaly or masses felt. Normal bowel sounds heard. Central nervous system: Alert and oriented. No focal neurological deficits. Extremities: Symmetric 5 x 5 power. Skin: No rashes, lesions or ulcers Psychiatry: Judgement and insight appear normal. Mood & affect appropriate.    Data Reviewed: I have personally reviewed following labs and imaging studies  CBC:  Recent Labs Lab 09/07/16 1404 09/07/16 1752 09/08/16 0037  WBC 14.2* 13.8* 11.8*  HGB 10.2* 9.1* 8.6*  HCT 35.0* 31.8* 30.6*  MCV 70.9* 71.1* 72.0*  PLT 367 332 312   Basic Metabolic Panel:  Recent Labs Lab 09/07/16 1404 09/08/16 0037  NA 140 140  K 3.1* 3.4*  CL 101 105  CO2 25 26  GLUCOSE 141* 109*  BUN 29* 23*  CREATININE 0.67 0.64  CALCIUM 8.9 8.6*  MG 1.5*  --    Liver Function Tests:  Recent Labs  Lab 09/07/16 1404 09/08/16 0037  AST 23 19  ALT 13* 11*  ALKPHOS 68 58  BILITOT 0.5 0.3  PROT 8.0 6.9  ALBUMIN 4.2 3.5    Recent Labs Lab 09/07/16 1404  LIPASE 19   Coagulation Profile:  Recent Labs Lab 09/07/16 1542  INR 1.08   Anemia Panel:  Recent Labs  09/07/16 1752  VITAMINB12 572  FOLATE 17.7  FERRITIN 4*  TIBC 389  IRON 11*  RETICCTPCT 1.1   Urine analysis:    Component Value Date/Time   COLORURINE YELLOW 07/04/2010 1515   APPEARANCEUR CLEAR 07/04/2010 1515   LABSPEC 1.031 (H) 07/04/2010 1515   PHURINE 5.5 07/04/2010 1515   GLUCOSEU NEGATIVE 07/04/2010 1515   HGBUR NEGATIVE 07/04/2010 1515   BILIRUBINUR neg 09/11/2012 2009   KETONESUR 40 (A) 07/04/2010 1515   PROTEINUR neg 09/11/2012 2009   PROTEINUR NEGATIVE 07/04/2010 1515   UROBILINOGEN 0.2 09/11/2012  2009   UROBILINOGEN 0.2 07/04/2010 1515   NITRITE neg 09/11/2012 2009   NITRITE NEGATIVE 07/04/2010 1515   LEUKOCYTESUR Negative 09/11/2012 2009   Recent Results (from the past 240 hour(s))  MRSA PCR Screening     Status: None   Collection Time: 09/07/16  5:20 PM  Result Value Ref Range Status   MRSA by PCR NEGATIVE NEGATIVE Final    Comment:        The GeneXpert MRSA Assay (FDA approved for NASAL specimens only), is one component of a comprehensive MRSA colonization surveillance program. It is not intended to diagnose MRSA infection nor to guide or monitor treatment for MRSA infections.       Radiology Studies: Ct Abdomen Pelvis W Contrast  Result Date: 09/07/2016 CLINICAL DATA:  Patient states he has been having N/V and severe abdominal pain staring last night. The vomit has been dark brown in color. Patient states this has been recurrent throughout the year with this being the worst so farPt.states mid abdominal pain x's 3 days, nausea, vomiting EXAM: CT ABDOMEN AND PELVIS WITH CONTRAST TECHNIQUE: Multidetector CT imaging of the abdomen and pelvis was performed using the standard protocol following bolus administration of intravenous contrast. CONTRAST:  100 mL of Isovue-300 intravenous contrast COMPARISON:  09/12/2012 FINDINGS: Lower chest: No acute abnormality. Hepatobiliary: No focal liver abnormality is seen. No gallstones, gallbladder wall thickening, or biliary dilatation. Pancreas: Unremarkable. No pancreatic ductal dilatation or surrounding inflammatory changes. Spleen: Normal in size without focal abnormality. Adrenals/Urinary Tract: No adrenal masses. 9 mm low-density left midpole renal cyst. No other renal masses or lesions. No stones. No hydronephrosis. Normal ureters. Normal bladder. Stomach/Bowel: There is wall thickening of the gastric antrum mild hazy opacity in the adjacent fat. This is consistent with gastritis. Small bowel: Unremarkable.  Appendix not visualized.  Vascular/Lymphatic: Aortic atherosclerosis. No enlarged abdominal or pelvic lymph nodes. Reproductive: Unremarkable. Other: No abdominal wall hernia or abnormality. No abdominopelvic ascites. Musculoskeletal: No acute or significant osseous findings. IMPRESSION: 1. Wall thickening of the gastric antrum with adjacent inflammatory change. This is consistent with gastritis. Consider follow-up upper endoscopy or upper GI for further assessment. 2. No other evidence of an acute abnormality. 3. Small left renal cyst. 4. Aortic atherosclerosis. Electronically Signed   By: Amie Portland M.D.   On: 09/07/2016 15:47      Scheduled Meds: . [MAR Hold] pantoprazole  40 mg Intravenous Q12H   Continuous Infusions: . sodium chloride    . pantoprozole (PROTONIX) infusion 8 mg/hr (09/08/16 0208)     LOS: 1 day  Time spent: 35 minutes   Debbora Presto, MD [facility] Pager [phone]  If 7PM-7AM, please contact night-coverage [email or web address] Password [email or web address] 09/08/2016, 9:15 AM

## 2016-09-09 LAB — CBC
HCT: 27.9 % — ABNORMAL LOW (ref 39.0–52.0)
HEMOGLOBIN: 7.8 g/dL — AB (ref 13.0–17.0)
MCH: 20.4 pg — AB (ref 26.0–34.0)
MCHC: 28 g/dL — ABNORMAL LOW (ref 30.0–36.0)
MCV: 73 fL — AB (ref 78.0–100.0)
PLATELETS: 239 10*3/uL (ref 150–400)
RBC: 3.82 MIL/uL — AB (ref 4.22–5.81)
RDW: 17.5 % — ABNORMAL HIGH (ref 11.5–15.5)
WBC: 7.9 10*3/uL (ref 4.0–10.5)

## 2016-09-09 LAB — BASIC METABOLIC PANEL
ANION GAP: 6 (ref 5–15)
BUN: 18 mg/dL (ref 6–20)
CALCIUM: 8.2 mg/dL — AB (ref 8.9–10.3)
CO2: 26 mmol/L (ref 22–32)
CREATININE: 0.59 mg/dL — AB (ref 0.61–1.24)
Chloride: 106 mmol/L (ref 101–111)
GFR calc Af Amer: >60 mL/min (ref >60)
GFR calc non Af Amer: >60 mL/min (ref >60)
Glucose, Bld: 79 mg/dL (ref 65–99)
Potassium: 3.8 mmol/L (ref 3.5–5.1)
Sodium: 138 mmol/L (ref 135–145)

## 2016-09-09 LAB — MAGNESIUM: MAGNESIUM: 1.9 mg/dL (ref 1.7–2.4)

## 2016-09-09 NOTE — Progress Notes (Signed)
[facility] Progress Note  Subjective:  Feels good.  No further vomiting.  No BM.  No sign of bleeding.  Abdominal pain is better.  EGD from 7/21:  - Normal esophagus - Non-bleeding gastric ulcer with a visible vessel. Treated with a monopolar probe. - One non-bleeding duodenal ulcer with no stigmata of bleeding. 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 - Biopsies were taken with a cold forceps for Helicobacter pylori testing.  Objective:  Vital signs in last 24 hours: Temp:  [97.4 F (36.3 C)-98.2 F (36.8 C)] 97.9 F (36.6 C) (07/22 0813) Pulse Rate:  [55-79] 59 (07/22 0700) Resp:  [9-34] 15 (07/22 0700) BP: (100-154)/(67-99) 100/76 (07/22 0600) SpO2:  [93 %-97 %] 93 % (07/22 0700) Last BM Date: 09/06/16 General:  Alert, Well-developed, in NAD Heart:  Regular rate and rhythm; no murmurs Pulm:  CTAB.  No increased WOB. Abdomen:  Soft, non-distended.  BS present.  Minimal upper abdominal TTP.  Extremities:  Without edema. Neurologic:  Alert and oriented x 4;  grossly normal neurologically. Psych:  Alert and cooperative. Normal mood and affect.  Intake/Output from previous day: 07/21 0701 - 07/22 0700 In: 2097.5 [P.O.:75; I.V.:2022.5] Out: 750 [Urine:750] Intake/Output this shift: Total I/O In: -  Out: 600 [Urine:600]  Lab Results:  Recent Labs  09/07/16 1752 09/08/16 0037 09/09/16 0342  WBC 13.8* 11.8* 7.9  HGB 9.1* 8.6* 7.8*  HCT 31.8* 30.6* 27.9*  PLT 332 312 239   BMET  Recent Labs  09/07/16 1404 09/08/16 0037 09/09/16 0342  NA 140 140 138  K 3.1* 3.4* 3.8  CL 101 105 106  CO2 25 26 26   GLUCOSE 141* 109* 79  BUN 29* 23* 18  CREATININE 0.67 0.64 0.59*  CALCIUM 8.9 8.6* 8.2*   LFT  Recent Labs  09/08/16 0037  PROT 6.9  ALBUMIN 3.5  AST 19  ALT 11*  ALKPHOS 58  BILITOT 0.3   PT/INR  Recent Labs  09/07/16 1542  LABPROT 14.1  INR 1.08   Ct Abdomen  Pelvis W Contrast  Result Date: 09/07/2016 CLINICAL DATA:  Patient states he has been having N/V and severe abdominal pain staring last night. The vomit has been dark brown in color. Patient states this has been recurrent throughout the year with this being the worst so farPt.states mid abdominal pain x's 3 days, nausea, vomiting EXAM: CT ABDOMEN AND PELVIS WITH CONTRAST TECHNIQUE: Multidetector CT imaging of the abdomen and pelvis was performed using the standard protocol following bolus administration of intravenous contrast. CONTRAST:  100 mL of Isovue-300 intravenous contrast COMPARISON:  09/12/2012 FINDINGS: Lower chest: No acute abnormality. Hepatobiliary: No focal liver abnormality is seen. No gallstones, gallbladder wall thickening, or biliary dilatation. Pancreas: Unremarkable. No pancreatic ductal dilatation or surrounding inflammatory changes. Spleen: Normal in size without focal abnormality. Adrenals/Urinary Tract: No adrenal masses. 9 mm low-density left midpole renal cyst. No other renal masses or lesions. No stones. No hydronephrosis. Normal ureters. Normal bladder. Stomach/Bowel: There is wall thickening of the gastric antrum mild hazy opacity in the adjacent fat. This is consistent with gastritis. Small bowel: Unremarkable.  Appendix not visualized. Vascular/Lymphatic: Aortic atherosclerosis. No enlarged abdominal or pelvic lymph nodes. Reproductive: Unremarkable. Other: No abdominal wall hernia or abnormality. No abdominopelvic ascites. Musculoskeletal: No acute or significant osseous findings. IMPRESSION: 1. Wall thickening of the gastric antrum with adjacent inflammatory change. This is consistent with gastritis. Consider follow-up upper endoscopy  or upper GI for further assessment. 2. No other evidence of an acute abnormality. 3. Small left renal cyst. 4. Aortic atherosclerosis. Electronically Signed   By: Amie Portland M.D.   On: 09/07/2016 15:47   Assessment / Plan: -Large gastric ulcer  with visible vessel that was treated with cautery and large duodenal ulcer without stigmata of bleeding, but associated duodenal stenosis from edema seen on EGD 7/21.  These are likely secondary to the NSAID's that he was using (BC's and Goody powders).  Biopsies obtained for Hpylori -UGIB with hematemesis and CGE along with epigastric pain secondary to above. -Blood loss anemia:  Likely acute on chronic blood loss.  Iron studies very low.  Hgb down slightly today but no sign of active bleeding so likely dilutional.  *Ok for clear liquids today. *Continue protonix 40 mg IV BID for 72 hours post-procedure.  Then will need PO BID at discharge. *No NSAID's. *Await biopsy results. *Will need repeat EGD in 3 months. *Monitor Hgb and transfuse prn. *If ok later today then can likely be transferred out of [place]. *Will likely need iron supplements and may actually benefit from IV iron infusion.    LOS: 2 days   Edelin Fryer D.  09/09/2016, 9:07 AM  Pager number [phone]

## 2016-09-09 NOTE — Progress Notes (Signed)
[name], male   DOB: 1965/06/07, 51 y.o.   MRN: [id number]    PROGRESS NOTE  Austin [name]  [id number] DOB: 12-Mar-1965 DOA: 09/07/2016  PCP: Patient, No Pcp Per   Brief Narrative:  Pt is 51 yo male with known GERD, headaches for which he takes goody powders, presented with epigastric pain and hematemesis. Imaging studies notable for gastritis. Pt started on PPI and taken for EGD this am.  Assessment & Plan:   Principal Problem:   Upper GI bleed, hematemesis, acute blood loss anemia - EGD done 07/21, notable for nonbleeding gastric ulcer with a visible vessel - EGD also notable for one nonbleeding duodenal ulcer with no stigmata of bleeding - Hg is down in the past 24 hours but pt denies any active bleeding  - no NSAID's - continue Protonix 72 hours post endo therapy - advance diet to clear liquids  - Follow-up on pathology results - Plan to repeat endoscopy in 3 months for reassessment of ulcers    Leukocytosis - Suspect this is reactive in the setting of the above - resolved     Hypokalemia and hypomagnesemia - supplemented and WNL   DVT prophylaxis: SCDs Code Status: Full Family Communication: Patient at bedside  Disposition Plan: home when GI team clears   Consultants:   GI  Procedures:   EGD 09/08/2016  Antimicrobials:   None  Subjective: Pt reports feeling better.   Objective: Vitals:   09/09/16 0500 09/09/16 0600 09/09/16 0700 09/09/16 0813  BP:  100/76    Pulse: (!) 58 (!) 55 (!) 59   Resp: 12 12 15    Temp:    97.9 F (36.6 C)  TempSrc:    Oral  SpO2: 93% 95% 93%   Weight:      Height:        Intake/Output Summary (Last 24 hours) at 09/09/16 0916 Last data filed at 09/09/16 0813  Gross per 24 hour  Intake           2097.5 ml  Output             1350 ml  Net            747.5 ml   Filed Weights   09/07/16 1730 09/08/16 0443  Weight: 66.6 kg (146 lb 13.2 oz) 67.6 kg (149 lb 0.5 oz)   Physical Exam  Constitutional:  Appears well-developed and well-nourished. No distress.  CVS: RRR, S1/S2 +, no murmurs, no gallops, no carotid bruit.  Pulmonary: Effort and breath sounds normal, no stridor, rhonchi, wheezes, rales.  Abdominal: Soft. BS +,  no distension, tenderness, rebound or guarding.  Musculoskeletal: Normal range of motion. No edema and no tenderness.  Lymphadenopathy: No lymphadenopathy noted, cervical, inguinal. Neuro: Alert. Normal reflexes, muscle tone coordination. No cranial nerve deficit. Skin: Skin is warm and dry. No rash noted. Not diaphoretic. No erythema. No pallor.  Psychiatric: Normal mood and affect. Behavior, judgment, thought content normal.    Data Reviewed: I have personally reviewed following labs and imaging studies  CBC:  Recent Labs Lab 09/07/16 1404 09/07/16 1752 09/08/16 0037 09/09/16 0342  WBC 14.2* 13.8* 11.8* 7.9  HGB 10.2* 9.1* 8.6* 7.8*  HCT 35.0* 31.8* 30.6* 27.9*  MCV 70.9* 71.1* 72.0* 73.0*  PLT 367 332 312 239   Basic Metabolic Panel:  Recent Labs Lab 09/07/16 1404 09/08/16 0037 09/08/16 0040 09/09/16 0342  NA 140 140  --  138  K 3.1* 3.4*  --  3.8  CL  101 105  --  106  CO2 25 26  --  26  GLUCOSE 141* 109*  --  79  BUN 29* 23*  --  18  CREATININE 0.67 0.64  --  0.59*  CALCIUM 8.9 8.6*  --  8.2*  MG 1.5*  --  1.7 1.9   Liver Function Tests:  Recent Labs Lab 09/07/16 1404 09/08/16 0037  AST 23 19  ALT 13* 11*  ALKPHOS 68 58  BILITOT 0.5 0.3  PROT 8.0 6.9  ALBUMIN 4.2 3.5    Recent Labs Lab 09/07/16 1404  LIPASE 19   Coagulation Profile:  Recent Labs Lab 09/07/16 1542  INR 1.08   Anemia Panel:  Recent Labs  09/07/16 1752  VITAMINB12 572  FOLATE 17.7  FERRITIN 4*  TIBC 389  IRON 11*  RETICCTPCT 1.1   Urine analysis:    Component Value Date/Time   COLORURINE YELLOW 07/04/2010 1515   APPEARANCEUR CLEAR 07/04/2010 1515   LABSPEC 1.031 (H) 07/04/2010 1515   PHURINE 5.5 07/04/2010 1515   GLUCOSEU NEGATIVE  07/04/2010 1515   HGBUR NEGATIVE 07/04/2010 1515   BILIRUBINUR neg 09/11/2012 2009   KETONESUR 40 (A) 07/04/2010 1515   PROTEINUR neg 09/11/2012 2009   PROTEINUR NEGATIVE 07/04/2010 1515   UROBILINOGEN 0.2 09/11/2012 2009   UROBILINOGEN 0.2 07/04/2010 1515   NITRITE neg 09/11/2012 2009   NITRITE NEGATIVE 07/04/2010 1515   LEUKOCYTESUR Negative 09/11/2012 2009   Recent Results (from the past 240 hour(s))  MRSA PCR Screening     Status: None   Collection Time: 09/07/16  5:20 PM  Result Value Ref Range Status   MRSA by PCR NEGATIVE NEGATIVE Final    Comment:        The GeneXpert MRSA Assay (FDA approved for NASAL specimens only), is one component of a comprehensive MRSA colonization surveillance program. It is not intended to diagnose MRSA infection nor to guide or monitor treatment for MRSA infections.       Radiology Studies: Ct Abdomen Pelvis W Contrast  Result Date: 09/07/2016 CLINICAL DATA:  Patient states he has been having N/V and severe abdominal pain staring last night. The vomit has been dark brown in color. Patient states this has been recurrent throughout the year with this being the worst so farPt.states mid abdominal pain x's 3 days, nausea, vomiting EXAM: CT ABDOMEN AND PELVIS WITH CONTRAST TECHNIQUE: Multidetector CT imaging of the abdomen and pelvis was performed using the standard protocol following bolus administration of intravenous contrast. CONTRAST:  100 mL of Isovue-300 intravenous contrast COMPARISON:  09/12/2012 FINDINGS: Lower chest: No acute abnormality. Hepatobiliary: No focal liver abnormality is seen. No gallstones, gallbladder wall thickening, or biliary dilatation. Pancreas: Unremarkable. No pancreatic ductal dilatation or surrounding inflammatory changes. Spleen: Normal in size without focal abnormality. Adrenals/Urinary Tract: No adrenal masses. 9 mm low-density left midpole renal cyst. No other renal masses or lesions. No stones. No hydronephrosis.  Normal ureters. Normal bladder. Stomach/Bowel: There is wall thickening of the gastric antrum mild hazy opacity in the adjacent fat. This is consistent with gastritis. Small bowel: Unremarkable.  Appendix not visualized. Vascular/Lymphatic: Aortic atherosclerosis. No enlarged abdominal or pelvic lymph nodes. Reproductive: Unremarkable. Other: No abdominal wall hernia or abnormality. No abdominopelvic ascites. Musculoskeletal: No acute or significant osseous findings. IMPRESSION: 1. Wall thickening of the gastric antrum with adjacent inflammatory change. This is consistent with gastritis. Consider follow-up upper endoscopy or upper GI for further assessment. 2. No other evidence of an acute abnormality. 3. Small left  renal cyst. 4. Aortic atherosclerosis. Electronically Signed   By: Amie Portland M.D.   On: 09/07/2016 15:47      Scheduled Meds: . nicotine  21 mg Transdermal Daily  . [START ON 09/11/2016] pantoprazole  40 mg Intravenous Q12H   Continuous Infusions: . 0.9 % NaCl with KCl 40 mEq / L 75 mL/hr at 09/09/16 0600  . pantoprozole (PROTONIX) infusion 8 mg/hr (09/09/16 0600)     LOS: 2 days   Time spent: 25 minutes   Debbora Presto, MD [facility] Pager [phone]  If 7PM-7AM, please contact night-coverage [email or web address] Password [facility] 09/09/2016, 9:16 AM

## 2016-09-10 ENCOUNTER — Encounter (HOSPITAL_COMMUNITY): Payer: Self-pay | Admitting: Gastroenterology

## 2016-09-10 DIAGNOSIS — K264 Chronic or unspecified duodenal ulcer with hemorrhage: Secondary | ICD-10-CM

## 2016-09-10 DIAGNOSIS — K920 Hematemesis: Secondary | ICD-10-CM

## 2016-09-10 DIAGNOSIS — D62 Acute posthemorrhagic anemia: Secondary | ICD-10-CM

## 2016-09-10 LAB — BASIC METABOLIC PANEL WITH GFR
Anion gap: 8 (ref 5–15)
BUN: 10 mg/dL (ref 6–20)
CO2: 25 mmol/L (ref 22–32)
Calcium: 8.3 mg/dL — ABNORMAL LOW (ref 8.9–10.3)
Chloride: 104 mmol/L (ref 101–111)
Creatinine, Ser: 0.57 mg/dL — ABNORMAL LOW (ref 0.61–1.24)
GFR calc Af Amer: >60 mL/min
GFR calc non Af Amer: >60 mL/min
Glucose, Bld: 90 mg/dL (ref 65–99)
Potassium: 3.8 mmol/L (ref 3.5–5.1)
Sodium: 137 mmol/L (ref 135–145)

## 2016-09-10 LAB — CBC
HEMATOCRIT: 27.3 % — AB (ref 39.0–52.0)
Hemoglobin: 7.9 g/dL — ABNORMAL LOW (ref 13.0–17.0)
MCH: 20.7 pg — ABNORMAL LOW (ref 26.0–34.0)
MCHC: 28.9 g/dL — AB (ref 30.0–36.0)
MCV: 71.5 fL — AB (ref 78.0–100.0)
Platelets: 228 10*3/uL (ref 150–400)
RBC: 3.82 MIL/uL — ABNORMAL LOW (ref 4.22–5.81)
RDW: 17.4 % — AB (ref 11.5–15.5)
WBC: 5.5 10*3/uL (ref 4.0–10.5)

## 2016-09-10 LAB — MAGNESIUM: Magnesium: 1.7 mg/dL (ref 1.7–2.4)

## 2016-09-10 MED ORDER — SODIUM CHLORIDE 0.9 % IV SOLN
510.0000 mg | Freq: Once | INTRAVENOUS | Status: AC
  Administered 2016-09-10: 510 mg via INTRAVENOUS
  Filled 2016-09-10: qty 17

## 2016-09-10 NOTE — Progress Notes (Signed)
[name], male   DOB: 1965/03/27, 51 y.o.   MRN: [id number]    PROGRESS NOTE  Tania Perrott  WUJ:[id number] DOB: [birth year] DOA: 09/07/2016  PCP: Patient, No Pcp Per   Brief Narrative:  Pt is 51 yo male with known GERD, headaches for which he takes goody powders, presented with epigastric pain and hematemesis. Imaging studies notable for gastritis. Pt started on PPI and taken for EGD this am.  Assessment & Plan:   Principal Problem:   Upper GI bleed, hematemesis, acute blood loss anemia - EGD done 07/21, notable for nonbleeding gastric ulcer with a visible vessel - EGD also notable for one nonbleeding duodenal ulcer with no stigmata of bleeding - Hg overall stable in the past 24 hours - no NSAID's - advanced diet, keep on Protonix drip per GI  - Follow-up on pathology results - Plan to repeat endoscopy in 3 months for reassessment of ulcers    Leukocytosis - Suspect this is reactive in the setting of the above - resolved     Hypokalemia and hypomagnesemia - supplemented   DVT prophylaxis: SCDs Code Status: Full Family Communication: Patient at bedside  Disposition Plan: home in AM   Consultants:   GI  Procedures:   EGD 09/08/2016  Antimicrobials:   None  Subjective: Pt denies concerns,   Objective: Vitals:   09/09/16 1400 09/09/16 1510 09/09/16 2038 09/10/16 0604  BP: 104/74 121/85 133/85 123/79  Pulse: 71 62 67 68  Resp: 15 16 16 18   Temp:  98.2 F (36.8 C) 97.7 F (36.5 C) 98.1 F (36.7 C)  TempSrc:  Oral Oral Oral  SpO2: 97% 100% 99% 96%  Weight:      Height:        Intake/Output Summary (Last 24 hours) at 09/10/16 1143 Last data filed at 09/10/16 0605  Gross per 24 hour  Intake          1407.92 ml  Output              950 ml  Net           457.92 ml   Filed Weights   09/07/16 1730 09/08/16 0443  Weight: 66.6 kg (146 lb 13.2 oz) 67.6 kg (149 lb 0.5 oz)   Physical Exam  Constitutional: Appears well-developed and  well-nourished. No distress.  CVS: RRR, S1/S2 +, no murmurs, no gallops, no carotid bruit.  Pulmonary: Effort and breath sounds normal, no stridor, rhonchi, wheezes, rales.  Abdominal: Soft. BS +,  no distension, tenderness, rebound or guarding.  Musculoskeletal: Normal range of motion. No edema and no tenderness.   Data Reviewed: I have personally reviewed following labs and imaging studies  CBC:  Recent Labs Lab 09/07/16 1404 09/07/16 1752 09/08/16 0037 09/09/16 0342 09/10/16 0403  WBC 14.2* 13.8* 11.8* 7.9 5.5  HGB 10.2* 9.1* 8.6* 7.8* 7.9*  HCT 35.0* 31.8* 30.6* 27.9* 27.3*  MCV 70.9* 71.1* 72.0* 73.0* 71.5*  PLT 367 332 312 239 228   Basic Metabolic Panel:  Recent Labs Lab 09/07/16 1404 09/08/16 0037 09/08/16 0040 09/09/16 0342 09/10/16 0403  NA 140 140  --  138 137  K 3.1* 3.4*  --  3.8 3.8  CL 101 105  --  106 104  CO2 25 26  --  26 25  GLUCOSE 141* 109*  --  79 90  BUN 29* 23*  --  18 10  CREATININE 0.67 0.64  --  0.59* 0.57*  CALCIUM 8.9 8.6*  --  8.2* 8.3*  MG 1.5*  --  1.7 1.9 1.7   Liver Function Tests:  Recent Labs Lab 09/07/16 1404 09/08/16 0037  AST 23 19  ALT 13* 11*  ALKPHOS 68 58  BILITOT 0.5 0.3  PROT 8.0 6.9  ALBUMIN 4.2 3.5    Recent Labs Lab 09/07/16 1404  LIPASE 19   Coagulation Profile:  Recent Labs Lab 09/07/16 1542  INR 1.08   Anemia Panel:  Recent Labs  09/07/16 1752  VITAMINB12 572  FOLATE 17.7  FERRITIN 4*  TIBC 389  IRON 11*  RETICCTPCT 1.1   Urine analysis:    Component Value Date/Time   COLORURINE YELLOW 07/04/2010 1515   APPEARANCEUR CLEAR 07/04/2010 1515   LABSPEC 1.031 (H) 07/04/2010 1515   PHURINE 5.5 07/04/2010 1515   GLUCOSEU NEGATIVE 07/04/2010 1515   HGBUR NEGATIVE 07/04/2010 1515   BILIRUBINUR neg 09/11/2012 2009   KETONESUR 40 (A) 07/04/2010 1515   PROTEINUR neg 09/11/2012 2009   PROTEINUR NEGATIVE 07/04/2010 1515   UROBILINOGEN 0.2 09/11/2012 2009   UROBILINOGEN 0.2 07/04/2010 1515    NITRITE neg 09/11/2012 2009   NITRITE NEGATIVE 07/04/2010 1515   LEUKOCYTESUR Negative 09/11/2012 2009   Recent Results (from the past 240 hour(s))  MRSA PCR Screening     Status: None   Collection Time: 09/07/16  5:20 PM  Result Value Ref Range Status   MRSA by PCR NEGATIVE NEGATIVE Final    Comment:        The GeneXpert MRSA Assay (FDA approved for NASAL specimens only), is one component of a comprehensive MRSA colonization surveillance program. It is not intended to diagnose MRSA infection nor to guide or monitor treatment for MRSA infections.       Radiology Studies: No results found.    Scheduled Meds: . nicotine  21 mg Transdermal Daily  . [START ON 09/11/2016] pantoprazole  40 mg Intravenous Q12H   Continuous Infusions:    LOS: 3 days   Time spent: 15 minutes  Debbora Presto, MD [facility] Pager [phone]  If 7PM-7AM, please contact night-coverage [email or web address] Password [facility] 09/10/2016, 11:43 AM

## 2016-09-10 NOTE — Progress Notes (Signed)
    Progress Note   Subjective  Chief Complaint: Upper GI Bleed, ABLA  Pt doing well this morning, still no BM since before procedures. No further hematemesis, tolerating liquid diet. Still some epigastric abdominal pain.   Objective   Vital signs in last 24 hours: Temp:  [97.7 F (36.5 C)-98.2 F (36.8 C)] 98.1 F (36.7 C) (07/23 0604) Pulse Rate:  [62-71] 68 (07/23 0604) Resp:  [8-18] 18 (07/23 0604) BP: (104-133)/(74-90) 123/79 (07/23 0604) SpO2:  [96 %-100 %] 96 % (07/23 0604) Last BM Date: 09/06/16 General: Caucasian male in NAD Heart:  Regular rate and rhythm; no murmurs Lungs: Respirations even and unlabored, lungs CTA bilaterally Abdomen:  Soft, mild epigastric pain and nondistended. Normal bowel sounds. Extremities:  Without edema. Neurologic:  Alert and oriented,  grossly normal neurologically. Psych:  Cooperative. Normal mood and affect.  Intake/Output from previous day: 07/22 0701 - 07/23 0700 In: 1647.9 [P.O.:720; I.V.:927.9] Out: 1875 [Urine:1875]  Lab Results:  Recent Labs  09/08/16 0037 09/09/16 0342 09/10/16 0403  WBC 11.8* 7.9 5.5  HGB 8.6* 7.8* 7.9*  HCT 30.6* 27.9* 27.3*  PLT 312 239 228   BMET  Recent Labs  09/08/16 0037 09/09/16 0342 09/10/16 0403  NA 140 138 137  K 3.4* 3.8 3.8  CL 105 106 104  CO2 26 26 25   GLUCOSE 109* 79 90  BUN 23* 18 10  CREATININE 0.64 0.59* 0.57*  CALCIUM 8.6* 8.2* 8.3*   LFT  Recent Labs  09/08/16 0037  PROT 6.9  ALBUMIN 3.5  AST 19  ALT 11*  ALKPHOS 58  BILITOT 0.3   PT/INR  Recent Labs  09/07/16 1542  LABPROT 14.1  INR 1.08     Assessment / Plan:   Assessment: 1. Upper GI Bleed: EGD 09/08/16-pyloric ulcer with visible vessel treated, no further melena, hgb stable 2. Acute blood loss anemia: hgb stable today 7.8 to 7.9 over past 24 hrs  Plan: 1. PPI changed to IV BID 2. Patient placed on low fiber diet today, this will likely continue once outpatient for at least 2-3 weeks due to  possibility of GOO, if patient tolerates and does well today, could consider d/c tomorrow 3. Will d/c fluids as patient is able to tolerate liquids 4. Ordered IV iron today-patient will likely need to be on BID oral iron once discharged 5. Continue other supporitve measures 6. Also will need repeat EGD in 2-3 mos as outpatient with Dr. Adela Lank 7. Bx pending for h.pylori 8. Please await any final recs from Dr. Myrtie Neither  Thank you for your kind consultation, we will continue to follow   LOS: 3 days   Unk Lightning  09/10/2016, 9:34 AM  Pager # [phone]

## 2016-09-10 NOTE — Care Management Note (Signed)
Case Management Note  Patient Details  Name: Austin [name] MRN: [id number] Date of Birth: [birth year]  Subjective/Objective: 51 y/o m admitted w/Upper GIB. From home.GI following.                   Action/Plan:d/c plan home.  Expected Discharge Date:   (unknown)               Expected Discharge Plan:  Home/Self Care  In-House Referral:     Discharge planning Services  CM Consult  Post Acute Care Choice:    Choice offered to:     DME Arranged:    DME Agency:     HH Arranged:    HH Agency:     Status of Service:  In process, will continue to follow  If discussed at Long Length of Stay Meetings, dates discussed:    Additional Comments:  Lanier Clam, RN 09/10/2016, 11:16 AM

## 2016-09-11 ENCOUNTER — Telehealth: Payer: Self-pay

## 2016-09-11 DIAGNOSIS — K922 Gastrointestinal hemorrhage, unspecified: Secondary | ICD-10-CM

## 2016-09-11 DIAGNOSIS — K266 Chronic or unspecified duodenal ulcer with both hemorrhage and perforation: Secondary | ICD-10-CM

## 2016-09-11 LAB — BASIC METABOLIC PANEL
ANION GAP: 6 (ref 5–15)
BUN: 10 mg/dL (ref 6–20)
CHLORIDE: 104 mmol/L (ref 101–111)
CO2: 30 mmol/L (ref 22–32)
Calcium: 8.8 mg/dL — ABNORMAL LOW (ref 8.9–10.3)
Creatinine, Ser: 0.69 mg/dL (ref 0.61–1.24)
GFR calc Af Amer: >60 mL/min (ref >60)
GFR calc non Af Amer: >60 mL/min (ref >60)
GLUCOSE: 106 mg/dL — AB (ref 65–99)
POTASSIUM: 3.8 mmol/L (ref 3.5–5.1)
Sodium: 140 mmol/L (ref 135–145)

## 2016-09-11 LAB — CBC
HCT: 28.9 % — ABNORMAL LOW (ref 39.0–52.0)
HEMOGLOBIN: 8.4 g/dL — AB (ref 13.0–17.0)
MCH: 20.9 pg — ABNORMAL LOW (ref 26.0–34.0)
MCHC: 29.1 g/dL — ABNORMAL LOW (ref 30.0–36.0)
MCV: 71.9 fL — AB (ref 78.0–100.0)
PLATELETS: 245 10*3/uL (ref 150–400)
RBC: 4.02 MIL/uL — AB (ref 4.22–5.81)
RDW: 17.4 % — ABNORMAL HIGH (ref 11.5–15.5)
WBC: 6.9 10*3/uL (ref 4.0–10.5)

## 2016-09-11 MED ORDER — PANTOPRAZOLE SODIUM 40 MG PO TBEC: 40.0000 mg | DELAYED_RELEASE_TABLET | Freq: Two times a day (BID) | ORAL | 1 refills | Status: DC

## 2016-09-11 MED ORDER — PANTOPRAZOLE SODIUM 40 MG PO TBEC
40.0000 mg | DELAYED_RELEASE_TABLET | Freq: Two times a day (BID) | ORAL | Status: DC
  Administered 2016-09-11: 40 mg via ORAL
  Filled 2016-09-11: qty 1

## 2016-09-11 MED ORDER — HYDROCODONE-ACETAMINOPHEN 5-325 MG PO TABS: 1.0000 | ORAL_TABLET | Freq: Four times a day (QID) | ORAL | 0 refills | Status: DC | PRN

## 2016-09-11 MED ORDER — FERROUS SULFATE 325 (65 FE) MG PO TABS: 325.0000 mg | ORAL_TABLET | Freq: Two times a day (BID) | ORAL | 1 refills | Status: DC

## 2016-09-11 NOTE — Telephone Encounter (Signed)
There are no appointment available for office or [facility].  I will call when the schedule is out.

## 2016-09-11 NOTE — Telephone Encounter (Signed)
-----   Message from [facility] [place], [place] sent at 09/11/2016  9:38 AM EDT ----- Regarding: Please set up outpatient appt with in 3-4 weeks Appt can be with me or jess for hospital follow up-will also need EGD with Dr. Arm in [facility] in 2-3 months if you could go ahead and get him on schedule.  Thanks-JLL

## 2016-09-11 NOTE — Progress Notes (Signed)
Discussed d/c instructions with patient , verbalized understanding. All questions answered appropriately.Multiple prescriptions given to patient.

## 2016-09-11 NOTE — Progress Notes (Signed)
    Progress Note   Subjective  Chief Complaint: Upper GI Bleed  Pt sitting up in bed this morning, he continues with some epigastric discomfort, but was able to eat two low fiber meals yesterday with no problem other than "feeling full". Did have a BM last night which was black in color, his first since the bleeding. He is in good spirits in the prospects of going home today.    Objective   Vital signs in last 24 hours: Temp:  [97.8 F (36.6 C)-98.3 F (36.8 C)] 97.9 F (36.6 C) (07/24 0630) Pulse Rate:  [66-98] 66 (07/24 0630) Resp:  [18-20] 20 (07/24 0630) BP: (123-134)/(87-93) 123/87 (07/24 0630) SpO2:  [95 %-100 %] 95 % (07/24 0630) Last BM Date: 09/10/16 General: Caucasian male in NAD Heart:  Regular rate and rhythm; no murmurs Lungs: Respirations even and unlabored, lungs CTA bilaterally Abdomen:  Soft, mild epigastric ttp and nondistended. Normal bowel sounds. Extremities:  Without edema. Neurologic:  Alert and oriented,  grossly normal neurologically. Psych:  Cooperative. Normal mood and affect.  Intake/Output from previous day: 07/23 0701 - 07/24 0700 In: 480 [P.O.:480] Out: -   Lab Results:  Recent Labs  09/09/16 0342 09/10/16 0403 09/11/16 0326  WBC 7.9 5.5 6.9  HGB 7.8* 7.9* 8.4*  HCT 27.9* 27.3* 28.9*  PLT 239 228 245   BMET  Recent Labs  09/09/16 0342 09/10/16 0403 09/11/16 0326  NA 138 137 140  K 3.8 3.8 3.8  CL 106 104 104  CO2 26 25 30   GLUCOSE 79 90 106*  BUN 18 10 10   CREATININE 0.59* 0.57* 0.69  CALCIUM 8.2* 8.3* 8.8*    Assessment / Plan:   Assessment: 1. Upper GI Bleed: EGD 09/08/16-pyloric ulcer with visible vessel treated, one melenic stool yesterday-residual blood, hgb stable 2. Acute blood loss anemia: hgb improved today 7.9 to 8.4  Plan: 1. Changed patient form IV to PO Pantoprazole 40 mg bid, he should continue this as an outpatient 2. Patient should remain on low fiber/low residue diet once discharged for two weeks,  this was discussed and patient requests information about this on discharge 3. No further NSAID use 4. Patient will follow in our outpatient clinic in 3-4 weeks and be arranged for repeat EGD with Dr. Adela Lank 5. Please await any final recommendations from Dr. Myrtie Neither 6. We will sign off today  Thank you for your kind consult.   LOS: 4 days   Unk Lightning  09/11/2016, 9:32 AM  Pager # [phone]  I have discussed the case with the PA, and that is the plan I formulated. I personally interviewed and examined the patient.  There is no further bleeding, the patient's hemoglobin is stable and he can be discharged home on twice daily PPI, oral iron tablets, and we will arrange outpatient follow-up with Dr. Adela Lank. The biopsy to rule out H. pylori will be followed up by Dr. Adela Lank and treated as needed.    Charlie Pitter III Pager [phone]  Mon-Fri 8a-5p [phone] after 5p, weekends, holidays

## 2016-09-11 NOTE — Discharge Summary (Signed)
Physician Discharge Summary  [name] ZOX:[id number] DOB: 1965/04/30 DOA: 09/07/2016  PCP: Austin [name], No Pcp Per  Admit date: 09/07/2016 Discharge date: 09/11/2016  Recommendations for Outpatient Follow-up:  1. Pt will need to follow up with PCP in 2-3 weeks post discharge 2. Please obtain BMP to evaluate electrolytes and kidney function 3. Please also check CBC to evaluate Hg and Hct levels 4. Pt also advised to follow up with GI doctor as he will need repeat endoscopy to ensure resolution of bleeding  5. Pt started on PPI BID  Discharge Diagnoses:  Principal Problem:   Upper GI bleed Active Problems:   Hematemesis   NSAID long-term use  Discharge Condition: Stable  Diet recommendation: Heart healthy diet discussed in details   History of present illness:  Pt is 51 yo male with known GERD, headaches for which he takes goody powders, presented with epigastric pain and hematemesis. Imaging studies notable for gastritis. Pt started on PPI and taken for EGD this am.  Assessment & Plan:   Principal Problem:   Upper GI bleed, hematemesis, acute blood loss anemia - EGD done 07/21, notable for nonbleeding gastric ulcer with a visible vessel - EGD also notable for one nonbleeding duodenal ulcer with no stigmata of bleeding - Hg overall stable in the past 24 hours - no NSAID's - diet advanced and pt tolerating well - pt reports feeling better and GI team cleared for discharge     Leukocytosis - Suspect this is reactive in the setting of the above - resolved     Hypokalemia and hypomagnesemia - supplemented   DVT prophylaxis: SCDs Code Status: Full Family Communication: pt at bedside  Disposition Plan: home   Consultants:   GI  Procedures:   EGD 09/08/2016  Antimicrobials:   None  Procedures/Studies: Ct Abdomen Pelvis W Contrast  Result Date: 09/07/2016 CLINICAL DATA:  Austin [name] states he has been having N/V and severe abdominal pain staring last night.  The vomit has been dark brown in color. Austin [name] states this has been recurrent throughout the year with this being the worst so farPt.states mid abdominal pain x's 3 days, nausea, vomiting EXAM: CT ABDOMEN AND PELVIS WITH CONTRAST TECHNIQUE: Multidetector CT imaging of the abdomen and pelvis was performed using the standard protocol following bolus administration of intravenous contrast. CONTRAST:  100 mL of Isovue-300 intravenous contrast COMPARISON:  09/12/2012 FINDINGS: Lower chest: No acute abnormality. Hepatobiliary: No focal liver abnormality is seen. No gallstones, gallbladder wall thickening, or biliary dilatation. Pancreas: Unremarkable. No pancreatic ductal dilatation or surrounding inflammatory changes. Spleen: Normal in size without focal abnormality. Adrenals/Urinary Tract: No adrenal masses. 9 mm low-density left midpole renal cyst. No other renal masses or lesions. No stones. No hydronephrosis. Normal ureters. Normal bladder. Stomach/Bowel: There is wall thickening of the gastric antrum mild hazy opacity in the adjacent fat. This is consistent with gastritis. Small bowel: Unremarkable.  Appendix not visualized. Vascular/Lymphatic: Aortic atherosclerosis. No enlarged abdominal or pelvic lymph nodes. Reproductive: Unremarkable. Other: No abdominal wall hernia or abnormality. No abdominopelvic ascites. Musculoskeletal: No acute or significant osseous findings. IMPRESSION: 1. Wall thickening of the gastric antrum with adjacent inflammatory change. This is consistent with gastritis. Consider follow-up upper endoscopy or upper GI for further assessment. 2. No other evidence of an acute abnormality. 3. Small left renal cyst. 4. Aortic atherosclerosis. Electronically Signed   By: Amie Portland M.D.   On: 09/07/2016 15:47     Discharge Exam: Vitals:   09/10/16 2127 09/11/16 0630  BP: Marland Kitchen)  134/91 123/87  Pulse: 72 66  Resp: 20 20  Temp: 98.3 F (36.8 C) 97.9 F (36.6 C)   Vitals:   09/10/16 0604  09/10/16 1318 09/10/16 2127 09/11/16 0630  BP: 123/79 (!) 134/93 (!) 134/91 123/87  Pulse: 68 98 72 66  Resp: 18 18 20 20   Temp: 98.1 F (36.7 C) 97.8 F (36.6 C) 98.3 F (36.8 C) 97.9 F (36.6 C)  TempSrc: Oral Oral Oral Oral  SpO2: 96% 100% 96% 95%  Weight:      Height:        General: Pt is alert, follows commands appropriately, not in acute distress Cardiovascular: Regular rate and rhythm, S1/S2 +, no murmurs, no rubs, no gallops Respiratory: Clear to auscultation bilaterally, no wheezing, no crackles, no rhonchi Abdominal: Soft, non tender, non distended, bowel sounds +, no guarding Extremities: no edema, no cyanosis, pulses palpable bilaterally DP and PT Neuro: Grossly nonfocal  Discharge Instructions   Allergies as of 09/11/2016   No Known Allergies     Medication List    STOP taking these medications   GOODY HEADACHE PO   omeprazole 20 MG capsule Commonly known as:  PRILOSEC   tamsulosin 0.4 MG Caps capsule Commonly known as:  FLOMAX     TAKE these medications   ferrous sulfate 325 (65 FE) MG tablet Take 1 tablet (325 mg total) by mouth 2 (two) times daily with a meal.   HYDROcodone-acetaminophen 5-325 MG tablet Commonly known as:  NORCO/VICODIN Take 1-2 tablets by mouth every 6 (six) hours as needed. What changed:  reasons to take this   pantoprazole 40 MG tablet Commonly known as:  PROTONIX Take 1 tablet (40 mg total) by mouth 2 (two) times daily.       Follow-up Information    [name], MD. Call.   Specialty:  Internal Medicine Why:  call my cell phone with questions [phone] Contact information: [address] [place] [address] [phone]            The results of significant diagnostics from this hospitalization (including imaging, microbiology, ancillary and laboratory) are listed below for reference.     Microbiology: Recent Results (from the past 240 hour(s))  MRSA PCR Screening     Status:  None   Collection Time: 09/07/16  5:20 PM  Result Value Ref Range Status   MRSA by PCR NEGATIVE NEGATIVE Final    Comment:        The GeneXpert MRSA Assay (FDA approved for NASAL specimens only), is one component of a comprehensive MRSA colonization surveillance program. It is not intended to diagnose MRSA infection nor to guide or monitor treatment for MRSA infections.      Labs: Basic Metabolic Panel:  Recent Labs Lab 09/07/16 1404 09/08/16 0037 09/08/16 0040 09/09/16 0342 09/10/16 0403 09/11/16 0326  NA 140 140  --  138 137 140  K 3.1* 3.4*  --  3.8 3.8 3.8  CL 101 105  --  106 104 104  CO2 25 26  --  26 25 30   GLUCOSE 141* 109*  --  79 90 106*  BUN 29* 23*  --  18 10 10   CREATININE 0.67 0.64  --  0.59* 0.57* 0.69  CALCIUM 8.9 8.6*  --  8.2* 8.3* 8.8*  MG 1.5*  --  1.7 1.9 1.7  --    Liver Function Tests:  Recent Labs Lab 09/07/16 1404 09/08/16 0037  AST 23 19  ALT 13*  11*  ALKPHOS 68 58  BILITOT 0.5 0.3  PROT 8.0 6.9  ALBUMIN 4.2 3.5    Recent Labs Lab 09/07/16 1404  LIPASE 19   CBC:  Recent Labs Lab 09/07/16 1752 09/08/16 0037 09/09/16 0342 09/10/16 0403 09/11/16 0326  WBC 13.8* 11.8* 7.9 5.5 6.9  HGB 9.1* 8.6* 7.8* 7.9* 8.4*  HCT 31.8* 30.6* 27.9* 27.3* 28.9*  MCV 71.1* 72.0* 73.0* 71.5* 71.9*  PLT 332 312 239 228 245   SIGNED: Time coordinating discharge: 30 minutes  Debbora Presto, MD  [facility] 09/11/2016, 8:42 AM Pager [phone]  If 7PM-7AM, please contact night-coverage [email or web address] Password [email or web address]

## 2016-09-11 NOTE — Progress Notes (Signed)
Patient d/c home. Stable. 

## 2016-09-11 NOTE — Discharge Instructions (Signed)
Low-Fiber Diet °Fiber is found in fruits, vegetables, and whole grains. A low-fiber diet restricts fibrous foods that are not digested in the small intestine. A diet containing about 10-15 grams of fiber per day is considered low fiber. Low-fiber diets may be used to: °· Promote healing and rest the bowel during intestinal flare-ups. °· Prevent blockage of a partially obstructed or narrowed gastrointestinal tract. °· Reduce fecal weight and volume. °· Slow the movement of feces. ° °You may be on a low-fiber diet as a transitional diet following surgery, after an injury (trauma), or because of a short (acute) or lifelong (chronic) illness. Your health care provider will determine the length of time you need to stay on this diet. °What do I need to know about a low-fiber diet? °Always check the fiber content on the packaging's Nutrition Facts label, especially on foods from the grains list. Ask your dietitian if you have questions about specific foods that are related to your condition, especially if the food is not listed below. In general, a low-fiber food will have less than 2 g of fiber. °What foods can I eat? °Grains °All breads and crackers made with white flour. Sweet rolls, doughnuts, waffles, pancakes, French toast, bagels. Pretzels, Melba toast, zwieback. Well-cooked cereals, such as cornmeal, farina, or cream cereals. Dry cereals that do not contain whole grains, fruit, or nuts, such as refined corn, wheat, rice, and oat cereals. Potatoes prepared any way without skins, plain pastas and noodles, refined white rice. Use white flour for baking and making sauces. Use allowed list of grains for casseroles, dumplings, and puddings. °Vegetables °Strained tomato and vegetable juices. Fresh lettuce, cucumber, spinach. Well-cooked (no skin or pulp) or canned vegetables, such as asparagus, bean sprouts, beets, carrots, green beans, mushrooms, potatoes, pumpkin, spinach, yellow squash, tomato sauce/puree, turnips,  yams, and zucchini. Keep servings limited to ½ cup. °Fruits °All fruit juices except prune juice. Cooked or canned fruits without skin and seeds, such as applesauce, apricots, cherries, fruit cocktail, grapefruit, grapes, mandarin oranges, melons, peaches, pears, pineapple, and plums. Fresh fruits without skin, such as apricots, avocados, bananas, melons, pineapple, nectarines, and peaches. Keep servings limited to ½ cup or 1 piece. °Meat and Other Protein Sources °Ground or well-cooked tender beef, ham, veal, lamb, pork, or poultry. Eggs, plain cheese. Fish, oysters, shrimp, lobster, and other seafood. Liver, organ meats. Smooth nut butters. °Dairy °All milk products and alternative dairy substitutes, such as soy, rice, almond, and coconut, not containing added whole nuts, seeds, or added fruit. °Beverages °Decaf coffee, fruit, and vegetable juices or smoothies (small amounts, with no pulp or skins, and with fruits from allowed list), sports drinks, herbal tea. °Condiments °Ketchup, mustard, vinegar, cream sauce, cheese sauce, cocoa powder. Spices in moderation, such as allspice, basil, bay leaves, celery powder or leaves, cinnamon, cumin powder, curry powder, ginger, mace, marjoram, onion or garlic powder, oregano, paprika, parsley flakes, ground pepper, rosemary, sage, savory, tarragon, thyme, and turmeric. °Sweets and Desserts °Plain cakes and cookies, pie made with allowed fruit, pudding, custard, cream pie. Gelatin, fruit, ice, sherbet, frozen ice pops. Ice cream, ice milk without nuts. Plain hard candy, honey, jelly, molasses, syrup, sugar, chocolate syrup, gumdrops, marshmallows. Limit overall sugar intake. °Fats and Oil °Margarine, butter, cream, mayonnaise, salad oils, plain salad dressings made from allowed foods. Choose healthy fats such as olive oil, canola oil, and omega-3 fatty acids (such as found in salmon or tuna) when possible. °Other °Bouillon, broth, or cream soups made from allowed foods. Any    strained soup. Casseroles or mixed dishes made with allowed foods. °The items listed above may not be a complete list of recommended foods or beverages. Contact your dietitian for more options. °What foods are not recommended? °Grains °All whole wheat and whole grain breads and crackers. Multigrains, rye, bran seeds, nuts, or coconut. Cereals containing whole grains, multigrains, bran, coconut, nuts, raisins. Cooked or dry oatmeal, steel-cut oats. Coarse wheat cereals, granola. Cereals advertised as high fiber. Potato skins. Whole grain pasta, wild or brown rice. Popcorn. Coconut flour. Bran, buckwheat, corn bread, multigrains, rye, wheat germ. °Vegetables °Fresh, cooked or canned vegetables, such as artichokes, asparagus, beet greens, broccoli, Brussels sprouts, cabbage, celery, cauliflower, corn, eggplant, kale, legumes or beans, okra, peas, and tomatoes. Avoid large servings of any vegetables, especially raw vegetables. °Fruits °Fresh fruits, such as apples with or without skin, berries, cherries, figs, grapes, grapefruit, guavas, kiwis, mangoes, oranges, papayas, pears, persimmons, pineapple, and pomegranate. Prune juice and juices with pulp, stewed or dried prunes. Dried fruits, dates, raisins. Fruit seeds or skins. Avoid large servings of all fresh fruits. °Meats and Other Protein Sources °Tough, fibrous meats with gristle. Chunky nut butter. Cheese made with seeds, nuts, or other foods not recommended. Nuts, seeds, legumes (beans, including baked beans), dried peas, beans, lentils. °Dairy °Yogurt or cheese that contains nuts, seeds, or added fruit. °Beverages °Fruit juices with high pulp, prune juice. Caffeinated coffee and teas. °Condiments °Coconut, maple syrup, pickles, olives. °Sweets and Desserts °Desserts, cookies, or candies that contain nuts or coconut, chunky peanut butter, dried fruits. Jams, preserves with seeds, marmalade. Large amounts of sugar and sweets. Any other dessert made with fruits from  the not recommended list. °Other °Soups made from vegetables that are not recommended or that contain other foods not recommended. °The items listed above may not be a complete list of foods and beverages to avoid. Contact your dietitian for more information. °This information is not intended to replace advice given to you by your health care provider. Make sure you discuss any questions you have with your health care provider. °Document Released: 07/28/2001 Document Revised: 07/14/2015 Document Reviewed: 12/29/2012 °Elsevier Interactive Patient Education © 2017 [employer] ° °

## 2016-09-12 NOTE — Telephone Encounter (Signed)
EGD 11/09/16 at 10 am with Dr Adela Lank, follow up with Doug Sou on  09/25/16 at 1:30 pm.  Ambulatory referral has been put in [facility] and instructions in communications.  The pt has been advised of the appts

## 2016-09-13 ENCOUNTER — Encounter: Payer: Self-pay | Admitting: Gastroenterology

## 2016-09-25 ENCOUNTER — Ambulatory Visit (INDEPENDENT_AMBULATORY_CARE_PROVIDER_SITE_OTHER): Payer: Managed Care, Other (non HMO) | Admitting: Gastroenterology

## 2016-09-25 ENCOUNTER — Telehealth: Payer: Self-pay

## 2016-09-25 ENCOUNTER — Other Ambulatory Visit: Payer: Managed Care, Other (non HMO)

## 2016-09-25 ENCOUNTER — Other Ambulatory Visit (INDEPENDENT_AMBULATORY_CARE_PROVIDER_SITE_OTHER): Payer: Managed Care, Other (non HMO)

## 2016-09-25 ENCOUNTER — Encounter: Payer: Self-pay | Admitting: Gastroenterology

## 2016-09-25 VITALS — BP 94/72 | HR 64 | Ht 72.0 in | Wt 149.2 lb

## 2016-09-25 DIAGNOSIS — K922 Gastrointestinal hemorrhage, unspecified: Secondary | ICD-10-CM

## 2016-09-25 DIAGNOSIS — K259 Gastric ulcer, unspecified as acute or chronic, without hemorrhage or perforation: Secondary | ICD-10-CM

## 2016-09-25 DIAGNOSIS — Z791 Long term (current) use of non-steroidal anti-inflammatories (NSAID): Secondary | ICD-10-CM

## 2016-09-25 DIAGNOSIS — K269 Duodenal ulcer, unspecified as acute or chronic, without hemorrhage or perforation: Secondary | ICD-10-CM

## 2016-09-25 DIAGNOSIS — D509 Iron deficiency anemia, unspecified: Secondary | ICD-10-CM

## 2016-09-25 LAB — CBC WITH DIFFERENTIAL/PLATELET
Basophils Absolute: 0.1 10*3/uL (ref 0.0–0.1)
Basophils Relative: 1.2 % (ref 0.0–3.0)
EOS ABS: 0.1 10*3/uL (ref 0.0–0.7)
Eosinophils Relative: 1 % (ref 0.0–5.0)
HCT: 38.5 % — ABNORMAL LOW (ref 39.0–52.0)
HEMOGLOBIN: 12 g/dL — AB (ref 13.0–17.0)
LYMPHS ABS: 1.7 10*3/uL (ref 0.7–4.0)
Lymphocytes Relative: 18.8 % (ref 12.0–46.0)
MCHC: 31.1 g/dL (ref 30.0–36.0)
MCV: 77 fl — ABNORMAL LOW (ref 78.0–100.0)
MONO ABS: 0.4 10*3/uL (ref 0.1–1.0)
Monocytes Relative: 5 % (ref 3.0–12.0)
NEUTROS PCT: 74 % (ref 43.0–77.0)
Neutro Abs: 6.6 10*3/uL (ref 1.4–7.7)
Platelets: 356 10*3/uL (ref 150.0–400.0)
RBC: 5.01 Mil/uL (ref 4.22–5.81)
RDW: 27.3 % — AB (ref 11.5–15.5)
WBC: 8.9 10*3/uL (ref 4.0–10.5)

## 2016-09-25 MED ORDER — SUCRALFATE 1 G PO TABS: 1.0000 g | ORAL_TABLET | Freq: Three times a day (TID) | ORAL | 1 refills | Status: DC

## 2016-09-25 NOTE — Patient Instructions (Addendum)
Please go to the basement level to have your labs drawn.  We have sent the following medications to your pharmacy for you to pick up at your convenience: [facility] [address]  1. Carafate Tablets.   We will call you an appointment for Iron Infusions.   You have been scheduled for an endoscopy. Please follow written instructions given to you at your visit today. If you use inhalers (even only as needed), please bring them with you on the day of your procedure. Your physician has requested that you go to www.startemmi.com and enter the access code given to you at your visit today. This web site gives a general overview about your procedure. However, you should still follow specific instructions given to you by our office regarding your preparation for the procedure.

## 2016-09-25 NOTE — Progress Notes (Signed)
09/25/2016 Austin [name] [id number] [birth year]   HISTORY OF PRESENT ILLNESS:  This is a 51 year old male who was admitted to the hospital in July for NSAID induced ulcers that caused an UGIB. He had been taking a lot of BC powders and Goody powders. Was found to have cratered gastric ulcer with visible vessel that was cauterized. Also had a cratered duodenal ulcer.  Hgb nadir was 7.8 grams and was 8.4 grams at the time of discharge.  Iron studies very low.  He continues on pantoprazole 40 mg BID.  Is no longer taking NSAID's.  Is taking ferrous sulfate 325 mg BID but is not tolerating it well, makes him very nauseated.  Still very sore and uncomfortable in upper abdomen.  Actually seems better when he has food in his stomach.     Past Medical History:  Diagnosis Date  . Anxiety   . GERD (gastroesophageal reflux disease)   . Headache(784.0)    hx of migraines-none recent  . History of kidney stones 09/19/12   left ureteral stone-SURGERY 8/6 URETEROSCOPY / STENT PLACEMENT    Past Surgical History:  Procedure Laterality Date  . CYSTO, LEFT RGP, DIAGNOSTIC URETEROSCOPY AND STENT PLACEMENT  09/24/2012  . CYSTOSCOPY WITH RETROGRADE PYELOGRAM, URETEROSCOPY AND STENT PLACEMENT Left 09/24/2012   Procedure: CYSTOSCOPY WITH RETROGRADE PYELOGRAM, DIAGNOSTIC URETEROSCOPY AND STENT PLACEMENT;  Surgeon: Sebastian Ache, MD;  Location: [facility];  Service: Urology;  Laterality: Left;  . CYSTOSCOPY WITH RETROGRADE PYELOGRAM, URETEROSCOPY AND STENT PLACEMENT Left 10/08/2012   Procedure: CYSTOSCOPY WITH RETROGRADE PYELOGRAM, URETEROSCOPY AND STENT PLACEMENT;  Surgeon: Sebastian Ache, MD;  Location: [facility];  Service: Urology;  Laterality: Left;  1 HR NEEDS DIGITAL URETEROSCOPE   . ESOPHAGOGASTRODUODENOSCOPY N/A 09/08/2016   Procedure: ESOPHAGOGASTRODUODENOSCOPY (EGD);  Surgeon: Ruffin Frederick, MD;  Location: [facility];  Service: Gastroenterology;  Laterality: N/A;  . HOLMIUM LASER APPLICATION Left  10/08/2012   Procedure: HOLMIUM LASER APPLICATION;  Surgeon: Sebastian Ache, MD;  Location: [facility];  Service: Urology;  Laterality: Left;  . TONSILLECTOMY     t&a as a child  . wisdom teeth extracted      reports that he has been smoking Cigarettes.  He has a 28.00 pack-year smoking history. He has never used smokeless tobacco. He reports that he uses drugs, including Marijuana. He reports that he does not drink alcohol. family history includes Heart disease in his mother; Ulcerative colitis in his father. No Known Allergies    Outpatient Encounter Prescriptions as of 09/25/2016  Medication Sig  . HYDROcodone-acetaminophen (NORCO/VICODIN) 5-325 MG tablet Take 1-2 tablets by mouth every 6 (six) hours as needed.  . pantoprazole (PROTONIX) 40 MG tablet Take 1 tablet (40 mg total) by mouth 2 (two) times daily.  . [DISCONTINUED] ferrous sulfate 325 (65 FE) MG tablet Take 1 tablet (325 mg total) by mouth 2 (two) times daily with a meal.   No facility-administered encounter medications on file as of 09/25/2016.      REVIEW OF SYSTEMS  : All other systems reviewed and negative except where noted in the History of Present Illness.   PHYSICAL EXAM: BP 94/72   Pulse 64   Ht 6' (1.829 m)   Wt 149 lb 3.2 oz (67.7 kg)   BMI 20.24 kg/m  General: Well developed white male in no acute distress; slightly pale. Head: Normocephalic and atraumatic Eyes:  Sclerae anicteric, conjunctiva pink. Ears: Normal auditory acuity Lungs: Clear throughout to auscultation; no increased WOB. Heart: Regular rate and  rhythm Abdomen: Soft, non-distended.  Normal bowel sounds.  Mild mid-abdominal TTP. Musculoskeletal: Symmetrical with no gross deformities  Skin: No lesions on visible extremities Extremities: No edema  Neurological: Alert oriented x 4, grossly non-focal Psychological:  Alert and cooperative. Normal mood and affect  ASSESSMENT AND PLAN: *51 year old male with NSAID induced DU and GU causing UGIB and  requiring cautery of the GU.  Plan for repeat EGD in mid-October.  Will continue pantoprazole 40 mg BID.  Will add carafate 1 gm ACHS for now as well. *IDA with component of ABLA:  Hgb 8.4 grams at discharge.  Did not require blood transfusion.  Is on ferrous sulfate 325 mg BID, but not tolerating well.  Will recheck CBC and plan for iron infusion x 2.   CC:  No ref. provider found

## 2016-09-25 NOTE — Progress Notes (Signed)
Agree with assessment and plan as outlined. 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 He has been recovering well. Continue protonix 40mg  BID, hold all NSAIDs. Plan for repeat EGD in the next 2 months.

## 2016-09-25 NOTE — Telephone Encounter (Signed)
-----   Message from Derry Skill, CMA sent at 09/25/2016  2:32 PM EDT ----- Genice Rouge told me to send you a note. She needs this man to have Iron infusions x 2 with Feraheme.   Thank  You,  Galvin Proffer, RN .   Lowry Ram CMA

## 2016-09-25 NOTE — Telephone Encounter (Signed)
See result note.  

## 2016-09-26 LAB — DIFFERENTIAL
BASOS PCT: 1 %
Basophils Absolute: 96 cells/uL (ref 0–200)
EOS ABS: 96 {cells}/uL (ref 15–500)
Eosinophils Relative: 1 %
Lymphocytes Relative: 20 %
Lymphs Abs: 1920 cells/uL (ref 850–3900)
MONOS PCT: 5 %
Monocytes Absolute: 480 cells/uL (ref 200–950)
NEUTROS ABS: 7008 {cells}/uL (ref 1500–7800)
Neutrophils Relative %: 73 %

## 2016-09-27 ENCOUNTER — Telehealth: Payer: Self-pay | Admitting: Gastroenterology

## 2016-09-27 ENCOUNTER — Other Ambulatory Visit: Payer: Self-pay

## 2016-09-27 DIAGNOSIS — D509 Iron deficiency anemia, unspecified: Secondary | ICD-10-CM

## 2016-09-27 NOTE — Telephone Encounter (Signed)
I am not sure why this patient would need to work from home, hoping you can shed some light on this. Also wants to know if he needs to avoid milk. Thanks.

## 2016-09-27 NOTE — Telephone Encounter (Signed)
patient girlfriend stating that patient is wanting to work from home because of his condition. patient is saying that his work is requesting a work note stating that it would be ok for patient to work from home. She is wanting to know if patient needs to be avoiding milk?

## 2016-09-27 NOTE — Telephone Encounter (Signed)
Called patient's girlfriend back, let her know to come pick up work note and that it is okay to drink milk.

## 2016-09-27 NOTE — Telephone Encounter (Signed)
I cannot remember what he does for work but he was still having some abdominal pain when I saw him.  Had bad ulcers.  Ok to approve him to work from home for a couple of weeks, but hopefully will be feeling better by then.  Does not need to avoid milk.

## 2016-09-27 NOTE — Telephone Encounter (Signed)
Patient called back, he works from home but also does outside [employer]. He says that he is feeling better and is requesting a release to work note. I have taken care of this and it is at front desk.

## 2016-10-08 ENCOUNTER — Encounter (HOSPITAL_COMMUNITY): Payer: Self-pay

## 2016-10-08 ENCOUNTER — Encounter (HOSPITAL_COMMUNITY)
Admission: RE | Admit: 2016-10-08 | Discharge: 2016-10-08 | Disposition: A | Discharge status: Home / Self Care | Payer: Managed Care, Other (non HMO) | Source: Ambulatory Visit | Attending: Gastroenterology | Admitting: Gastroenterology

## 2016-10-08 DIAGNOSIS — D509 Iron deficiency anemia, unspecified: Secondary | ICD-10-CM | POA: Insufficient documentation

## 2016-10-08 MED ORDER — SODIUM CHLORIDE 0.9 % IV SOLN
INTRAVENOUS | Status: DC
  Administered 2016-10-08: 12:00:00 via INTRAVENOUS

## 2016-10-08 MED ORDER — SODIUM CHLORIDE 0.9 % IV SOLN
510.0000 mg | INTRAVENOUS | Status: DC
  Administered 2016-10-08: 510 mg via INTRAVENOUS
  Filled 2016-10-08: qty 17

## 2016-10-08 NOTE — Progress Notes (Signed)
Uneventful Feraheme infusion.  D/c instructions on feraheme given to pt.  Next appointment for 8/27 given to pt.  Pt was d/c ambulatory to lobby.

## 2016-10-08 NOTE — Discharge Instructions (Signed)

## 2016-10-10 ENCOUNTER — Telehealth: Payer: Self-pay | Admitting: Gastroenterology

## 2016-10-10 NOTE — Telephone Encounter (Signed)
10/11/16 2:30 pm appt with Amy.  Pt aware.

## 2016-10-10 NOTE — Telephone Encounter (Signed)
Had iron infusion on 8/20 felt good.   Sunday night prior to infusion, woke up with periumbilical abd pain 2/10 (the pain is not as intense as it was when he was admitted in the hospital for ulcers)  and vomiting (clear to tea colored fluid).  Vomiting continued until Monday and abd pain resolved after vomiting.  Then on Tuesday felt very fatigued and no appetite. Today feels like he is hungry and wants to eat but is having to force himself to eat.  Last episode of vomiting was 4 am this morning. Had BM today that was normal for the pt.  No other GI symptoms.  No fever.  He is worried about having infusion next Monday.  He wants to know if this is connected to the iron infusion.

## 2016-10-10 NOTE — Telephone Encounter (Signed)
This does not sound like it is related to the iron infusion if it started Sunday night prior to the infusion...are there any office appts available for him to be seen this week either by his GI MD or Amy?

## 2016-10-11 ENCOUNTER — Other Ambulatory Visit (INDEPENDENT_AMBULATORY_CARE_PROVIDER_SITE_OTHER): Payer: Managed Care, Other (non HMO)

## 2016-10-11 ENCOUNTER — Encounter: Payer: Self-pay | Admitting: Physician Assistant

## 2016-10-11 ENCOUNTER — Ambulatory Visit (INDEPENDENT_AMBULATORY_CARE_PROVIDER_SITE_OTHER): Payer: Managed Care, Other (non HMO) | Admitting: Physician Assistant

## 2016-10-11 VITALS — BP 110/68 | Ht 72.0 in | Wt 156.0 lb

## 2016-10-11 DIAGNOSIS — K259 Gastric ulcer, unspecified as acute or chronic, without hemorrhage or perforation: Secondary | ICD-10-CM | POA: Diagnosis not present

## 2016-10-11 DIAGNOSIS — K269 Duodenal ulcer, unspecified as acute or chronic, without hemorrhage or perforation: Secondary | ICD-10-CM | POA: Diagnosis not present

## 2016-10-11 DIAGNOSIS — R112 Nausea with vomiting, unspecified: Secondary | ICD-10-CM | POA: Diagnosis not present

## 2016-10-11 DIAGNOSIS — R1084 Generalized abdominal pain: Secondary | ICD-10-CM

## 2016-10-11 DIAGNOSIS — K315 Obstruction of duodenum: Secondary | ICD-10-CM

## 2016-10-11 LAB — CBC WITH DIFFERENTIAL/PLATELET
BASOS ABS: 0.1 10*3/uL (ref 0.0–0.1)
Basophils Relative: 0.8 % (ref 0.0–3.0)
EOS ABS: 0.2 10*3/uL (ref 0.0–0.7)
Eosinophils Relative: 2.2 % (ref 0.0–5.0)
HCT: 38.7 % — ABNORMAL LOW (ref 39.0–52.0)
Hemoglobin: 12 g/dL — ABNORMAL LOW (ref 13.0–17.0)
LYMPHS ABS: 1.7 10*3/uL (ref 0.7–4.0)
Lymphocytes Relative: 19.6 % (ref 12.0–46.0)
MCHC: 30.9 g/dL (ref 30.0–36.0)
MCV: 79.3 fl (ref 78.0–100.0)
MONO ABS: 0.7 10*3/uL (ref 0.1–1.0)
MONOS PCT: 7.6 % (ref 3.0–12.0)
NEUTROS ABS: 6.1 10*3/uL (ref 1.4–7.7)
NEUTROS PCT: 69.8 % (ref 43.0–77.0)
PLATELETS: 326 10*3/uL (ref 150.0–400.0)
RBC: 4.88 Mil/uL (ref 4.22–5.81)
RDW: 27.2 % — ABNORMAL HIGH (ref 11.5–15.5)
WBC: 8.7 10*3/uL (ref 4.0–10.5)

## 2016-10-11 MED ORDER — SUCRALFATE 1 G PO TABS: 1.0000 g | ORAL_TABLET | Freq: Three times a day (TID) | ORAL | 3 refills | Status: DC

## 2016-10-11 MED ORDER — PANTOPRAZOLE SODIUM 40 MG PO TBEC: 40.0000 mg | DELAYED_RELEASE_TABLET | Freq: Two times a day (BID) | ORAL | 3 refills | Status: DC

## 2016-10-11 NOTE — Patient Instructions (Addendum)
Please go to the basement level to have your labs drawn.  We have sent the following medications to your pharmacy for you to pick up at your convenience: 1. Carafate refills 2. Pantoprazole Sodium 40 mg.   Very soft to full liquid diet.   You have been scheduled for an endoscopy at [facility].  Please follow written instructions given to you at your visit today. If you use inhalers (even only as needed), please bring them with you on the day of your procedure.

## 2016-10-11 NOTE — Progress Notes (Signed)
Subjective:    [name], male    DOB: Sep 10, 1965, 51 y.o.   MRN: [id number]  HPI Austin [name] is a 51 year old white male, recently known to Dr. Adela Lank from hospital consultation. 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 He has been on Protonix 40 mg by mouth twice a day and Carafate 1 g 4 times daily. He also had a significant iron deficiency anemia. Hemoglobin last checked on 09/25/2016 after office visit here in hemoglobin was up to 12 hematocrit of 38.5. He was not tolerating oral iron well and was scheduled for iron infusion 2. Patient comes back in today stating that he has been feeling worse over the past week or so. Little over a week ago he developed recurrent epigastric pain and then several hours later in the middle of the night he woke up with vomiting. He says this was mostly liquid and not a lot of solid food. He says he feels much better after he vomits. His appetite has been somewhat decreased. The following day he underwent his first iron infusion, says he didn't feel very well that day and then again that evening vomited several hours after trying to eat. Since then but over the past couple of days he's had nausea and queasiness that seems to come and go in waves, epigastric discomfort coming in waves. He was awakened at 5 AM yesterday morning with vomiting Yesterday he says he was able to eat but didn't eat much and did not have any vomiting last evening.  He states he is taking his medications as directed, he has not been taking any BC, Goody powders or other NSAIDs, His weight is stable actually up a few pounds from last visit. He has been on rare of any melena or hematochezia and has not had overt hematemesis.  Review of Systems Pertinent positive and  negative review of systems were noted in the above HPI section.  All other review of systems was otherwise negative.  Outpatient Encounter Prescriptions as of 10/11/2016  Medication Sig  . pantoprazole (PROTONIX) 40 MG tablet Take 1 tablet (40 mg total) by mouth 2 (two) times daily.  . sucralfate (CARAFATE) 1 g tablet Take 1 tablet (1 g total) by mouth 4 (four) times daily -  with meals and at bedtime.  . [DISCONTINUED] HYDROcodone-acetaminophen (NORCO/VICODIN) 5-325 MG tablet Take 1-2 tablets by mouth every 6 (six) hours as needed.  . [DISCONTINUED] pantoprazole (PROTONIX) 40 MG tablet Take 1 tablet (40 mg total) by mouth 2 (two) times daily.  . [DISCONTINUED] sucralfate (CARAFATE) 1 g tablet Take 1 tablet (1 g total) by mouth 4 (four) times daily -  with meals and at bedtime.   No facility-administered encounter medications on file as of 10/11/2016.    No Known Allergies Patient Active Problem List   Diagnosis Date Noted  . Iron deficiency anemia 09/25/2016  . Peptic ulcer of stomach 09/25/2016  . Duodenal ulcer 09/25/2016  . UGIB (upper gastrointestinal bleed) 09/07/2016  . Hematemesis 09/07/2016  . NSAID long-term use    Social History   Social History  . Marital status: Single    Spouse name: N/A  . Number of children: N/A  . Years of education: N/A   Occupational History  . Not on file.   Social  History Main Topics  . Smoking status: Current Every Day Smoker    Packs/day: 1.00    Years: 28.00    Types: Cigarettes  . Smokeless tobacco: Never Used  . Alcohol use No     Comment: last marijuana was a year ago    weekend alcohol  . Drug use: Yes    Types: Marijuana  . Sexual activity: Not on file   Other Topics Concern  . Not on file   Social History Narrative  . No narrative on file    Austin [name] family history includes Heart disease in his mother; Ulcerative colitis in his father.      Objective:    Vitals:   10/11/16 1437  BP: 110/68    Physical Exam   well-developed white male in no acute distress, thin blood pressure 110/68, height 6 foot, weight 156, BMI 21.1. HEENT; nontraumatic normocephalic EOMI PERRLA sclera anicteric, Cardiovascular ;regular rate and rhythm with S1-S2 no murmur rub or gallop, Pulmonary ;clear, Abdomen; soft, bowel sounds are present he does have somewhat of a succussion splash. There is some tenderness in the epigastrium no guarding or rebound no palpable mass or hepatosplenomegaly. 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 At time of diagnosis he was hospitalized with GI bleed and anemia. Patient has been on treatment for his ulcer with high-dose PPI over the past month, hemoglobin has significantly improved, but he presents today with 1 week history of recurrent intermittent epigastric pain and repeated episodes of vomiting which are usually occurring in the middle of the night. I think he has at least a partial outlet obstruction secondary to persistent duodenal stenosis/stricture. Also question whether duodenal ulcer is healing.  #2 iron deficiency anemia-he had one iron infusion earlier this week and is scheduled for her second infusion next week which we will proceed with.  #3 tobacco abuse  Plan; Continue Protonix 40 mg by mouth twice daily refill sent Continue Carafate 1 g by mouth 4 times daily Patient advised to eat a very soft low residue diet and go to full liquids if he has persistent vomiting Discussed with Dr. Adela Lank and have scheduled him for upper endoscopy with possible duodenal dilation at [facility] next week on 10/18/2016 at 7:30 AM. Repeat CBC today   Latyra Jaye S Pyper Olexa PA-C 10/11/2016   Cc: No  ref. provider found

## 2016-10-11 NOTE — Progress Notes (Signed)
Agree with assessment and plan as outlined.  Scheduled for EGD at [facility] hospital Monday AM at 730 - in case dilation is needed, they have shorter pyloric balloons available.

## 2016-10-12 ENCOUNTER — Encounter (HOSPITAL_COMMUNITY): Payer: Self-pay | Admitting: *Deleted

## 2016-10-12 ENCOUNTER — Telehealth: Payer: Self-pay | Admitting: Gastroenterology

## 2016-10-12 NOTE — Telephone Encounter (Signed)
Routed to Dr. Armbruster. 

## 2016-10-12 NOTE — Telephone Encounter (Signed)
Yes that should be fine. Thanks.

## 2016-10-15 ENCOUNTER — Ambulatory Visit (HOSPITAL_COMMUNITY)
Admission: RE | Admit: 2016-10-15 | Discharge: 2016-10-15 | Disposition: A | Discharge status: Home / Self Care | Payer: Managed Care, Other (non HMO) | Source: Ambulatory Visit | Attending: Gastroenterology | Admitting: Gastroenterology

## 2016-10-15 ENCOUNTER — Encounter (HOSPITAL_COMMUNITY): Payer: Self-pay

## 2016-10-15 ENCOUNTER — Encounter (HOSPITAL_COMMUNITY)
Admission: RE | Disposition: A | Discharge status: Home / Self Care | Payer: Self-pay | Source: Ambulatory Visit | Attending: Gastroenterology

## 2016-10-15 ENCOUNTER — Ambulatory Visit (HOSPITAL_COMMUNITY): Payer: Managed Care, Other (non HMO) | Admitting: Anesthesiology

## 2016-10-15 ENCOUNTER — Encounter (HOSPITAL_COMMUNITY): Payer: Self-pay | Admitting: Certified Registered"

## 2016-10-15 ENCOUNTER — Encounter (HOSPITAL_COMMUNITY)
Admission: RE | Admit: 2016-10-15 | Discharge: 2016-10-15 | Disposition: A | Discharge status: Home / Self Care | Payer: Managed Care, Other (non HMO) | Source: Ambulatory Visit | Attending: Gastroenterology | Admitting: Gastroenterology

## 2016-10-15 DIAGNOSIS — F419 Anxiety disorder, unspecified: Secondary | ICD-10-CM | POA: Insufficient documentation

## 2016-10-15 DIAGNOSIS — K315 Obstruction of duodenum: Secondary | ICD-10-CM | POA: Diagnosis not present

## 2016-10-15 DIAGNOSIS — R1013 Epigastric pain: Secondary | ICD-10-CM | POA: Diagnosis not present

## 2016-10-15 DIAGNOSIS — K267 Chronic duodenal ulcer without hemorrhage or perforation: Secondary | ICD-10-CM | POA: Diagnosis not present

## 2016-10-15 DIAGNOSIS — R112 Nausea with vomiting, unspecified: Secondary | ICD-10-CM

## 2016-10-15 DIAGNOSIS — K259 Gastric ulcer, unspecified as acute or chronic, without hemorrhage or perforation: Secondary | ICD-10-CM

## 2016-10-15 DIAGNOSIS — D509 Iron deficiency anemia, unspecified: Secondary | ICD-10-CM

## 2016-10-15 DIAGNOSIS — F17210 Nicotine dependence, cigarettes, uncomplicated: Secondary | ICD-10-CM | POA: Diagnosis not present

## 2016-10-15 DIAGNOSIS — K277 Chronic peptic ulcer, site unspecified, without hemorrhage or perforation: Secondary | ICD-10-CM | POA: Diagnosis present

## 2016-10-15 DIAGNOSIS — K219 Gastro-esophageal reflux disease without esophagitis: Secondary | ICD-10-CM | POA: Diagnosis not present

## 2016-10-15 DIAGNOSIS — R1084 Generalized abdominal pain: Secondary | ICD-10-CM

## 2016-10-15 DIAGNOSIS — Z79899 Other long term (current) drug therapy: Secondary | ICD-10-CM | POA: Insufficient documentation

## 2016-10-15 DIAGNOSIS — K269 Duodenal ulcer, unspecified as acute or chronic, without hemorrhage or perforation: Secondary | ICD-10-CM

## 2016-10-15 HISTORY — PX: ESOPHAGOGASTRODUODENOSCOPY (EGD) WITH PROPOFOL: SHX5813

## 2016-10-15 HISTORY — DX: Gastric ulcer, unspecified as acute or chronic, without hemorrhage or perforation: K25.9

## 2016-10-15 SURGERY — ESOPHAGOGASTRODUODENOSCOPY (EGD) WITH PROPOFOL
Anesthesia: Monitor Anesthesia Care

## 2016-10-15 MED ORDER — LIDOCAINE HCL (CARDIAC) 20 MG/ML IV SOLN
INTRAVENOUS | Status: DC | PRN
  Administered 2016-10-15: 40 mg via INTRATRACHEAL

## 2016-10-15 MED ORDER — BUTAMBEN-TETRACAINE-BENZOCAINE 2-2-14 % EX AERO
INHALATION_SPRAY | CUTANEOUS | Status: DC | PRN
  Administered 2016-10-15: 1 via TOPICAL

## 2016-10-15 MED ORDER — SODIUM CHLORIDE 0.9 % IV SOLN
INTRAVENOUS | Status: AC
  Administered 2016-10-15: 12:00:00 via INTRAVENOUS

## 2016-10-15 MED ORDER — PROPOFOL 10 MG/ML IV BOLUS
INTRAVENOUS | Status: DC | PRN
  Administered 2016-10-15 (×2): 20 mg via INTRAVENOUS
  Administered 2016-10-15: 30 mg via INTRAVENOUS
  Administered 2016-10-15: 20 mg via INTRAVENOUS

## 2016-10-15 MED ORDER — SODIUM CHLORIDE 0.9 % IV SOLN
INTRAVENOUS | Status: DC
Start: 2016-10-15 — End: 2016-10-15

## 2016-10-15 MED ORDER — FENTANYL CITRATE (PF) 100 MCG/2ML IJ SOLN
INTRAMUSCULAR | Status: DC | PRN
  Administered 2016-10-15: 25 ug via INTRAVENOUS

## 2016-10-15 MED ORDER — FERUMOXYTOL INJECTION 510 MG/17 ML
510.0000 mg | INTRAVENOUS | Status: AC
Start: 2016-10-15 — End: 2016-10-15
  Administered 2016-10-15: 510 mg via INTRAVENOUS
  Filled 2016-10-15: qty 17

## 2016-10-15 MED ORDER — PROPOFOL 500 MG/50ML IV EMUL
INTRAVENOUS | Status: DC | PRN
Start: 2016-10-15 — End: 2016-10-15
  Administered 2016-10-15: 100 ug/kg/min via INTRAVENOUS

## 2016-10-15 MED ORDER — LACTATED RINGERS IV SOLN
INTRAVENOUS | Status: AC | PRN
  Administered 2016-10-15: 1000 mL via INTRAVENOUS

## 2016-10-15 SURGICAL SUPPLY — 14 items

## 2016-10-15 NOTE — Anesthesia Procedure Notes (Addendum)
Procedure Name: MAC Date/Time: 10/15/2016 7:23 AM Performed by: Barrington Ellison Pre-anesthesia Checklist: Patient identified, Emergency Drugs available, Suction available, Patient being monitored and Timeout performed Patient Re-evaluated:Patient Re-evaluated prior to induction Oxygen Delivery Method: Nasal cannula

## 2016-10-15 NOTE — Interval H&P Note (Signed)
History and Physical Interval Note:  10/15/2016 7:15 AM  Austin [name]  has presented today for surgery, with the diagnosis of Duodenal Ulcer, Gastric Ulcer, Vomiting  The various methods of treatment have been discussed with the patient and family. After consideration of risks, benefits and other options for treatment, the patient has consented to  Procedure(s): ESOPHAGOGASTRODUODENOSCOPY (EGD) WITH PROPOFOL; Duodenal dilation (N/A) as a surgical intervention .  The patient's history has been reviewed, patient examined, no change in status, stable for surgery.  I have reviewed the patient's chart and labs.  Questions were answered to the patient's satisfaction.     Reeves Forth Kymora Sciara

## 2016-10-15 NOTE — Discharge Instructions (Signed)
YOU HAD AN ENDOSCOPIC PROCEDURE TODAY: Refer to the procedure report and other information in the discharge instructions given to you for any specific questions about what was found during the examination. If this information does not answer your questions, please call [facility] office at [phone] to clarify.  ° °YOU SHOULD EXPECT: Some feelings of bloating in the abdomen. Passage of more gas than usual. Walking can help get rid of the air that was put into your GI tract during the procedure and reduce the bloating. If you had a lower endoscopy (such as a colonoscopy or flexible sigmoidoscopy) you may notice spotting of blood in your stool or on the toilet paper. Some abdominal soreness may be present for a day or two, also. ° °DIET: Your first meal following the procedure should be a light meal and then it is ok to progress to your normal diet. A half-sandwich or bowl of soup is an example of a good first meal. Heavy or fried foods are harder to digest and may make you feel nauseous or bloated. Drink plenty of fluids but you should avoid alcoholic beverages for 24 hours. If you had a esophageal dilation, please see attached instructions for diet.   ° °ACTIVITY: Your care partner should take you home directly after the procedure. You should plan to take it easy, moving slowly for the rest of the day. You can resume normal activity the day after the procedure however YOU SHOULD NOT DRIVE, use power tools, machinery or perform tasks that involve climbing or major physical exertion for 24 hours (because of the sedation medicines used during the test).  ° °SYMPTOMS TO REPORT IMMEDIATELY: °A gastroenterologist can be reached at any hour. Please call [phone]  for any of the following symptoms:  °Following lower endoscopy (colonoscopy, flexible sigmoidoscopy) °Excessive amounts of blood in the stool  °Significant tenderness, worsening of abdominal pains  °Swelling of the abdomen that is new, acute  °Fever of 100° or  higher  °Following upper endoscopy (EGD, EUS, ERCP, esophageal dilation) °Vomiting of blood or coffee ground material  °New, significant abdominal pain  °New, significant chest pain or pain under the shoulder blades  °Painful or persistently difficult swallowing  °New shortness of breath  °Black, tarry-looking or red, bloody stools ° °FOLLOW UP:  °If any biopsies were taken you will be contacted by phone or by letter within the next 1-3 weeks. Call [phone]  if you have not heard about the biopsies in 3 weeks.  °Please also call with any specific questions about appointments or follow up tests. ° °

## 2016-10-15 NOTE — H&P (View-Only) (Signed)
Subjective:    [name], male    DOB: Sep 10, 1965, 51 y.o.   MRN: [id number]  HPI Austin [name] is a 51 year old white male, recently known to Dr. Adela Lank from hospital consultation. 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 He has been on Protonix 40 mg by mouth twice a day and Carafate 1 g 4 times daily. He also had a significant iron deficiency anemia. Hemoglobin last checked on 09/25/2016 after office visit here in hemoglobin was up to 12 hematocrit of 38.5. He was not tolerating oral iron well and was scheduled for iron infusion 2. Patient comes back in today stating that he has been feeling worse over the past week or so. Little over a week ago he developed recurrent epigastric pain and then several hours later in the middle of the night he woke up with vomiting. He says this was mostly liquid and not a lot of solid food. He says he feels much better after he vomits. His appetite has been somewhat decreased. The following day he underwent his first iron infusion, says he didn't feel very well that day and then again that evening vomited several hours after trying to eat. Since then but over the past couple of days he's had nausea and queasiness that seems to come and go in waves, epigastric discomfort coming in waves. He was awakened at 5 AM yesterday morning with vomiting Yesterday he says he was able to eat but didn't eat much and did not have any vomiting last evening.  He states he is taking his medications as directed, he has not been taking any BC, Goody powders or other NSAIDs, His weight is stable actually up a few pounds from last visit. He has been on rare of any melena or hematochezia and has not had overt hematemesis.  Review of Systems Pertinent positive and  negative review of systems were noted in the above HPI section.  All other review of systems was otherwise negative.  Outpatient Encounter Prescriptions as of 10/11/2016  Medication Sig  . pantoprazole (PROTONIX) 40 MG tablet Take 1 tablet (40 mg total) by mouth 2 (two) times daily.  . sucralfate (CARAFATE) 1 g tablet Take 1 tablet (1 g total) by mouth 4 (four) times daily -  with meals and at bedtime.  . [DISCONTINUED] HYDROcodone-acetaminophen (NORCO/VICODIN) 5-325 MG tablet Take 1-2 tablets by mouth every 6 (six) hours as needed.  . [DISCONTINUED] pantoprazole (PROTONIX) 40 MG tablet Take 1 tablet (40 mg total) by mouth 2 (two) times daily.  . [DISCONTINUED] sucralfate (CARAFATE) 1 g tablet Take 1 tablet (1 g total) by mouth 4 (four) times daily -  with meals and at bedtime.   No facility-administered encounter medications on file as of 10/11/2016.    No Known Allergies Patient Active Problem List   Diagnosis Date Noted  . Iron deficiency anemia 09/25/2016  . Peptic ulcer of stomach 09/25/2016  . Duodenal ulcer 09/25/2016  . UGIB (upper gastrointestinal bleed) 09/07/2016  . Hematemesis 09/07/2016  . NSAID long-term use    Social History   Social History  . Marital status: Single    Spouse name: N/A  . Number of children: N/A  . Years of education: N/A   Occupational History  . Not on file.   Social  History Main Topics  . Smoking status: Current Every Day Smoker    Packs/day: 1.00    Years: 28.00    Types: Cigarettes  . Smokeless tobacco: Never Used  . Alcohol use No     Comment: last marijuana was a year ago    weekend alcohol  . Drug use: Yes    Types: Marijuana  . Sexual activity: Not on file   Other Topics Concern  . Not on file   Social History Narrative  . No narrative on file    Austin [name] family history includes Heart disease in his mother; Ulcerative colitis in his father.      Objective:    Vitals:   10/11/16 1437  BP: 110/68    Physical Exam   well-developed white male in no acute distress, thin blood pressure 110/68, height 6 foot, weight 156, BMI 21.1. HEENT; nontraumatic normocephalic EOMI PERRLA sclera anicteric, Cardiovascular ;regular rate and rhythm with S1-S2 no murmur rub or gallop, Pulmonary ;clear, Abdomen; soft, bowel sounds are present he does have somewhat of a succussion splash. There is some tenderness in the epigastrium no guarding or rebound no palpable mass or hepatosplenomegaly. 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 At time of diagnosis he was hospitalized with GI bleed and anemia. Patient has been on treatment for his ulcer with high-dose PPI over the past month, hemoglobin has significantly improved, but he presents today with 1 week history of recurrent intermittent epigastric pain and repeated episodes of vomiting which are usually occurring in the middle of the night. I think he has at least a partial outlet obstruction secondary to persistent duodenal stenosis/stricture. Also question whether duodenal ulcer is healing.  #2 iron deficiency anemia-he had one iron infusion earlier this week and is scheduled for her second infusion next week which we will proceed with.  #3 tobacco abuse  Plan; Continue Protonix 40 mg by mouth twice daily refill sent Continue Carafate 1 g by mouth 4 times daily Patient advised to eat a very soft low residue diet and go to full liquids if he has persistent vomiting Discussed with Dr. Adela Lank and have scheduled him for upper endoscopy with possible duodenal dilation at [facility] next week on 10/18/2016 at 7:30 AM. Repeat CBC today   Lethia Donlon S Senay Sistrunk PA-C 10/11/2016   Cc: No  ref. provider found

## 2016-10-15 NOTE — Anesthesia Postprocedure Evaluation (Signed)
Anesthesia Post Note  Patient: Austin [name]  Procedure(s) Performed: Procedure(s) (LRB): ESOPHAGOGASTRODUODENOSCOPY (EGD) WITH PROPOFOL; Duodenal dilation (N/A)     Patient location during evaluation: PACU Anesthesia Type: MAC Level of consciousness: awake and alert Pain management: pain level controlled Vital Signs Assessment: post-procedure vital signs reviewed and stable Respiratory status: spontaneous breathing, nonlabored ventilation and respiratory function stable Cardiovascular status: stable and blood pressure returned to baseline Anesthetic complications: no    Last Vitals:  Vitals:   10/15/16 0755 10/15/16 0810  BP: 101/77 112/78  Resp: 18 20  Temp: 36.6 C   SpO2: 96% 96%    Last Pain:  Vitals:   10/15/16 0755  TempSrc: Oral  PainSc:                  Audry Pili

## 2016-10-15 NOTE — Transfer of Care (Signed)
Immediate Anesthesia Transfer of Care Note  Patient: Phong Isenberg  Procedure(s) Performed: Procedure(s): ESOPHAGOGASTRODUODENOSCOPY (EGD) WITH PROPOFOL; Duodenal dilation (N/A)  Patient Location: Endoscopy Unit  Anesthesia Type:MAC  Level of Consciousness: awake, alert  and oriented  Airway & Oxygen Therapy: Patient Spontanous Breathing  Post-op Assessment: Report given to RN  Post vital signs: Reviewed and stable  Last Vitals:  Vitals:   10/15/16 0647  BP: 121/83  Resp: 13  Temp: 36.6 C  SpO2: 98%    Last Pain:  Vitals:   10/15/16 0647  TempSrc: Oral  PainSc: 2       Patients Stated Pain Goal: 2 (91/63/84 6659)  Complications: No apparent anesthesia complications

## 2016-10-15 NOTE — Op Note (Signed)
[facility] Patient Name: Austin [name] Procedure Date : 10/15/2016 MRN: [id number] Attending MD: Carlota Raspberry. Armbruster MD, MD Date of Birth: September 27, 1965 CSN: [id number] Age: 51 Admit Type: Outpatient Procedure:                Upper GI endoscopy Indications:              Follow-up of peptic ulcer of the pylorus and                            duodenum, on PPI for 1 month, now with intermittent                            nausea / vomiting Providers:                Carlota Raspberry. Armbruster MD, MD, Cleda Daub, RN,                            Elspeth Cho Tech., Technician, Sampson Si, CRNA Referring MD:              Medicines:                Monitored Anesthesia Care Complications:            No immediate complications. Estimated blood loss:                            Minimal. Estimated Blood Loss:     Estimated blood loss was minimal. Procedure:                Pre-Anesthesia Assessment:                           - Prior to the procedure, a History and Physical                            was performed, and patient medications and                            allergies were reviewed. The patient's tolerance of                            previous anesthesia was also reviewed. The risks                            and benefits of the procedure and the sedation                            options and risks were discussed with the patient.                            All questions were answered, and informed consent                            was obtained. Prior Anticoagulants: The patient has  taken no previous anticoagulant or antiplatelet                            agents. ASA Grade Assessment: II - A patient with                            mild systemic disease. After reviewing the risks                            and benefits, the patient was deemed in                            satisfactory condition to undergo the procedure.  After obtaining informed consent, the endoscope was                            passed under direct vision. Throughout the                            procedure, the patient's blood pressure, pulse, and                            oxygen saturations were monitored continuously. The                            EG-[id number] ([id number]) scope was introduced through the                            mouth, and advanced to the duodenal bulb. The upper                            GI endoscopy was accomplished without difficulty.                            The patient tolerated the procedure well. Scope In: Scope Out: Findings:      Esophagogastric landmarks were identified: the Z-line was found at 41       cm, the gastroesophageal junction was found at 41 cm and the upper       extent of the gastric folds was found at 41 cm from the incisors.      The exam of the esophagus was otherwise normal.      A medium amount of residual food was found in the gastric body,       consistent with partial outlet obstruction.      One non-bleeding cratered gastric ulcer with no stigmata of bleeding was       found in the prepyloric region of the stomach. The lesion was 4 mm in       largest dimension, appeared significantly improved since the initial       exam and is healing. Biopsies were taken with a cold forceps from the       periphery of the lesion for histology.      The exam of the stomach was otherwise normal of what was able to be       visualized. The body was not well visualized due to  retained food.      One non-bleeding cratered duodenal ulcer with no stigmata of bleeding       was found in the duodenal bulb. The lesion was 4-5 mm in largest       dimension - also improved in size since original exam.      An acquired benign-appearing, intrinsic severe stenosis was found in the       duodenal bulb, with retained food, making the bulb difficult to       visualize. The retained food was removed and the  stenosis was roughly       76m in diameter and < 1cm in length, in close proximity to the ulcer - I       could visualize the duodenum distal to the stricture but could not       traverse it. A TTS dilator was passed through the scope. Dilation with       an 09-27-08 mm pyloric balloon dilator was performed, at 865m 77m82mand       86m37mpost dilation inspection reviewed an appropriate mucosal wrent.       No further dilation was performed given the ulceration in close       proximity. Impression:               - Esophagogastric landmarks identified.                           - Normal esophagus otherwise                           - A medium amount of food (residue) in the stomach,                            consistent with partial outlet obstruction.                           - Non-bleeding gastric ulcer with no stigmata of                            bleeding - healing. Biopsied.                           - One non-bleeding duodenal ulcer with no stigmata                            of bleeding - healing.                           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 Dilated to 86mm70m                        with good result. Moderate Sedation:      No moderate sedation, case performed with MAC Recommendation:           - Patient has a contact number available for  emergencies. The signs and symptoms of potential                            delayed complications were discussed with the                            patient. Return to normal activities tomorrow.                            Written discharge instructions were provided to the                            patient.                           - Full liquid diet for now to minimize symptoms.                           - Continue present medications.                           - NO NSAIDs                           - Await pathology results.                            - Repeat upper endoscopy in 1 month for retreatment. Procedure Code(s):        --- Professional ---                           [phone], Esophagogastroduodenoscopy, flexible,                            transoral; with dilation of gastric/duodenal                            stricture(s) (eg, balloon, bougie)                           43239, Esophagogastroduodenoscopy, flexible,                            transoral; with biopsy, single or multiple Diagnosis Code(s):        --- Professional ---                           K25.9, Gastric ulcer, unspecified as acute or                            chronic, without hemorrhage or perforation                           K26.9, Duodenal ulcer, unspecified as acute or                            chronic, without  hemorrhage or perforation                           K31.5, Obstruction of duodenum                           K27.9, Peptic ulcer, site unspecified, unspecified                            as acute or chronic, without hemorrhage or                            perforation CPT copyright 2016 [employer]. All rights reserved. The codes documented in this report are preliminary and upon coder review may  be revised to meet current compliance requirements. Remo Lipps P. Armbruster MD, MD 10/15/2016 8:05:28 AM This report has been signed electronically. Number of Addenda: 0

## 2016-10-15 NOTE — Anesthesia Preprocedure Evaluation (Addendum)
Anesthesia Evaluation  Patient identified by MRN, date of birth, ID band Patient awake    Reviewed: Allergy & Precautions, H&P , NPO status , Patient's Chart, lab work & pertinent test results  Airway Mallampati: II  TM Distance: >3 FB Neck ROM: full    Dental no notable dental hx. (+) Teeth Intact, Dental Advisory Given,    Pulmonary Current Smoker,    Pulmonary exam normal breath sounds clear to auscultation       Cardiovascular Exercise Tolerance: Good negative cardio ROS Normal cardiovascular exam Rhythm:regular Rate:Normal     Neuro/Psych  Headaches, Anxiety    GI/Hepatic Neg liver ROS, PUD, GERD  Medicated and Controlled,Duodenal stricture on previous EGD   Endo/Other  negative endocrine ROS  Renal/GU negative Renal ROSHx nephrolithiasis  negative genitourinary   Musculoskeletal negative musculoskeletal ROS (+)   Abdominal   Peds  Hematology  (+) anemia ,   Anesthesia Other Findings   Reproductive/Obstetrics                          Anesthesia Physical  Anesthesia Plan  ASA: II  Anesthesia Plan: MAC   Post-op Pain Management:    Induction: Intravenous  PONV Risk Score and Plan: 0 and Propofol infusion and Treatment may vary due to age or medical condition  Airway Management Planned: Nasal Cannula  Additional Equipment:   Intra-op Plan:   Post-operative Plan:   Informed Consent: I have reviewed the patients History and Physical, chart, labs and discussed the procedure including the risks, benefits and alternatives for the proposed anesthesia with the patient or authorized representative who has indicated his/her understanding and acceptance.   Dental Advisory Given  Plan Discussed with: CRNA  Anesthesia Plan Comments:         Anesthesia Quick Evaluation

## 2016-10-15 NOTE — Telephone Encounter (Signed)
Left patient a vm that it is fine to continue with IV iron and procedure.

## 2016-10-16 ENCOUNTER — Encounter (HOSPITAL_COMMUNITY): Payer: Self-pay | Admitting: Gastroenterology

## 2016-10-16 ENCOUNTER — Telehealth: Payer: Self-pay | Admitting: Gastroenterology

## 2016-10-16 NOTE — Telephone Encounter (Signed)
Yes I was waiting on his path to come to coordinate it, but his next EGD which I believe is scheduled at the [facility] should be cancelled. He will need another EGD at the hospital for dilation of duodenal stenosis in another 4-6 weeks if you can help coordinate. Thanks

## 2016-10-16 NOTE — Telephone Encounter (Signed)
Please advise 

## 2016-10-16 NOTE — Telephone Encounter (Signed)
Spoke to patient let him know I cancelled his procedure here at [facility]. I will contact him tomorrow when I know when I can get a date/time at [facility]. Patient cannot do it 11/09/16 due to no ride.

## 2016-10-19 ENCOUNTER — Other Ambulatory Visit: Payer: Self-pay

## 2016-10-19 DIAGNOSIS — K315 Obstruction of duodenum: Secondary | ICD-10-CM

## 2016-10-19 NOTE — Telephone Encounter (Signed)
Dr. Adela Lank okay'd for patient to be scheduled on his hospital week. Patient aware next EGD with dilation is on 11/15/16 at [facility], 08:30. Sent prep instructions to patient, he is aware that he needs a ride home from caregiver that day.

## 2016-11-09 ENCOUNTER — Encounter: Payer: 59 | Admitting: Gastroenterology

## 2016-11-14 ENCOUNTER — Encounter (HOSPITAL_COMMUNITY): Payer: Self-pay | Admitting: *Deleted

## 2016-11-15 ENCOUNTER — Encounter (HOSPITAL_COMMUNITY): Payer: Self-pay | Admitting: *Deleted

## 2016-11-15 ENCOUNTER — Ambulatory Visit (HOSPITAL_COMMUNITY): Payer: Managed Care, Other (non HMO) | Admitting: Anesthesiology

## 2016-11-15 ENCOUNTER — Encounter (HOSPITAL_COMMUNITY)
Admission: RE | Disposition: A | Discharge status: Home / Self Care | Payer: Self-pay | Source: Ambulatory Visit | Attending: Gastroenterology

## 2016-11-15 ENCOUNTER — Ambulatory Visit (HOSPITAL_COMMUNITY)
Admission: RE | Admit: 2016-11-15 | Discharge: 2016-11-15 | Disposition: A | Discharge status: Home / Self Care | Payer: Managed Care, Other (non HMO) | Source: Ambulatory Visit | Attending: Gastroenterology | Admitting: Gastroenterology

## 2016-11-15 DIAGNOSIS — F419 Anxiety disorder, unspecified: Secondary | ICD-10-CM | POA: Diagnosis not present

## 2016-11-15 DIAGNOSIS — Z79899 Other long term (current) drug therapy: Secondary | ICD-10-CM | POA: Insufficient documentation

## 2016-11-15 DIAGNOSIS — Z8379 Family history of other diseases of the digestive system: Secondary | ICD-10-CM | POA: Insufficient documentation

## 2016-11-15 DIAGNOSIS — K259 Gastric ulcer, unspecified as acute or chronic, without hemorrhage or perforation: Secondary | ICD-10-CM | POA: Diagnosis not present

## 2016-11-15 DIAGNOSIS — K315 Obstruction of duodenum: Secondary | ICD-10-CM | POA: Diagnosis present

## 2016-11-15 DIAGNOSIS — F17210 Nicotine dependence, cigarettes, uncomplicated: Secondary | ICD-10-CM | POA: Insufficient documentation

## 2016-11-15 DIAGNOSIS — Z87442 Personal history of urinary calculi: Secondary | ICD-10-CM | POA: Diagnosis not present

## 2016-11-15 DIAGNOSIS — Z8719 Personal history of other diseases of the digestive system: Secondary | ICD-10-CM | POA: Insufficient documentation

## 2016-11-15 DIAGNOSIS — K219 Gastro-esophageal reflux disease without esophagitis: Secondary | ICD-10-CM | POA: Diagnosis not present

## 2016-11-15 HISTORY — PX: BALLOON DILATION: SHX5330

## 2016-11-15 HISTORY — PX: ESOPHAGOGASTRODUODENOSCOPY: SHX5428

## 2016-11-15 SURGERY — EGD (ESOPHAGOGASTRODUODENOSCOPY)
Anesthesia: Monitor Anesthesia Care

## 2016-11-15 MED ORDER — PROPOFOL 500 MG/50ML IV EMUL
INTRAVENOUS | Status: DC | PRN
  Administered 2016-11-15: 75 ug/kg/min via INTRAVENOUS

## 2016-11-15 MED ORDER — PROPOFOL 10 MG/ML IV BOLUS
INTRAVENOUS | Status: AC
  Filled 2016-11-15: qty 40

## 2016-11-15 MED ORDER — PROPOFOL 10 MG/ML IV BOLUS
INTRAVENOUS | Status: DC | PRN
  Administered 2016-11-15 (×2): 20 mg via INTRAVENOUS
  Administered 2016-11-15: 50 mg via INTRAVENOUS
  Administered 2016-11-15 (×2): 20 mg via INTRAVENOUS

## 2016-11-15 MED ORDER — LACTATED RINGERS IV SOLN
INTRAVENOUS | Status: DC
  Administered 2016-11-15: 07:00:00 via INTRAVENOUS

## 2016-11-15 MED ORDER — SODIUM CHLORIDE 0.9 % IV SOLN: INTRAVENOUS | Status: DC

## 2016-11-15 NOTE — Anesthesia Preprocedure Evaluation (Signed)
Anesthesia Evaluation  Patient identified by MRN, date of birth, ID band Patient awake    Reviewed: Allergy & Precautions, NPO status , Patient's Chart, lab work & pertinent test results  Airway Mallampati: II  TM Distance: >3 FB Neck ROM: Full    Dental no notable dental hx.    Pulmonary Current Smoker,    Pulmonary exam normal breath sounds clear to auscultation       Cardiovascular negative cardio ROS Normal cardiovascular exam Rhythm:Regular Rate:Normal     Neuro/Psych Anxiety negative neurological ROS     GI/Hepatic Neg liver ROS, GERD  ,  Endo/Other  negative endocrine ROS  Renal/GU negative Renal ROS  negative genitourinary   Musculoskeletal negative musculoskeletal ROS (+)   Abdominal   Peds negative pediatric ROS (+)  Hematology negative hematology ROS (+)   Anesthesia Other Findings   Reproductive/Obstetrics negative OB ROS                             Anesthesia Physical Anesthesia Plan  ASA: II  Anesthesia Plan: MAC   Post-op Pain Management:    Induction: Intravenous  PONV Risk Score and Plan: 0  Airway Management Planned: Simple Face Mask  Additional Equipment:   Intra-op Plan:   Post-operative Plan:   Informed Consent: I have reviewed the patients History and Physical, chart, labs and discussed the procedure including the risks, benefits and alternatives for the proposed anesthesia with the patient or authorized representative who has indicated his/her understanding and acceptance.   Dental advisory given  Plan Discussed with: CRNA and Surgeon  Anesthesia Plan Comments:         Anesthesia Quick Evaluation

## 2016-11-15 NOTE — Discharge Instructions (Signed)
YOU HAD AN ENDOSCOPIC PROCEDURE TODAY: Refer to the procedure report and other information in the discharge instructions given to you for any specific questions about what was found during the examination. If this information does not answer your questions, please call Dr. Johnson office at [phone] to clarify.  ° °YOU SHOULD EXPECT: Some feelings of bloating in the abdomen. Passage of more gas than usual. Walking can help get rid of the air that was put into your GI tract during the procedure and reduce the bloating. If you had a lower endoscopy (such as a colonoscopy or flexible sigmoidoscopy) you may notice spotting of blood in your stool or on the toilet paper. Some abdominal soreness may be present for a day or two, also. ° °DIET: Your first meal following the procedure should be a light meal and then it is ok to progress to your normal diet. A half-sandwich or bowl of soup is an example of a good first meal. Heavy or fried foods are harder to digest and may make you feel nauseous or bloated. Drink plenty of fluids but you should avoid alcoholic beverages for 24 hours. If you had a esophageal dilation, please see attached instructions for diet.  ° °ACTIVITY: Your care partner should take you home directly after the procedure. You should plan to take it easy, moving slowly for the rest of the day. You can resume normal activity the day after the procedure however YOU SHOULD NOT DRIVE, use power tools, machinery or perform tasks that involve climbing or major physical exertion for 24 hours (because of the sedation medicines used during the test).  ° °SYMPTOMS TO REPORT IMMEDIATELY: °A gastroenterologist can be reached at any hour. Please call [phone] for any of the following symptoms:  °Following lower endoscopy (colonoscopy, flexible sigmoidoscopy) °Excessive amounts of blood in the stool  °Significant tenderness, worsening of abdominal pains  °Swelling of the abdomen that is new, acute  °Fever of 100°  or higher  °Following upper endoscopy (EGD, EUS, ERCP, esophageal dilation) °Vomiting of blood or coffee ground material  °New, significant abdominal pain  °New, significant chest pain or pain under the shoulder blades  °Painful or persistently difficult swallowing  °New shortness of breath  °Black, tarry-looking or red, bloody stools ° °FOLLOW UP:  °If any biopsies were taken you will be contacted by phone or by letter within the next 1-3 weeks. Call [phone]  if you have not heard about the biopsies in 3 weeks.  °Please also call with any specific questions about appointments or follow up tests. ° °YOU HAD AN ENDOSCOPIC PROCEDURE TODAY: Refer to the procedure report and other information in the discharge instructions given to you for any specific questions about what was found during the examination. If this information does not answer your questions, please call Dr. Johnson office at [phone] to clarify.  ° °YOU SHOULD EXPECT: Some feelings of bloating in the abdomen. Passage of more gas than usual. Walking can help get rid of the air that was put into your GI tract during the procedure and reduce the bloating. If you had a lower endoscopy (such as a colonoscopy or flexible sigmoidoscopy) you may notice spotting of blood in your stool or on the toilet paper. Some abdominal soreness may be present for a day or two, also. ° °DIET: Your first meal following the procedure should be a light meal and then it is ok to progress to your normal diet. A half-sandwich or bowl of soup is an example   of a good first meal. Heavy or fried foods are harder to digest and may make you feel nauseous or bloated. Drink plenty of fluids but you should avoid alcoholic beverages for 24 hours. If you had a esophageal dilation, please see attached instructions for diet.  ° °ACTIVITY: Your care partner should take you home directly after the procedure. You should plan to take it easy, moving slowly for the rest of the day. You can  resume normal activity the day after the procedure however YOU SHOULD NOT DRIVE, use power tools, machinery or perform tasks that involve climbing or major physical exertion for 24 hours (because of the sedation medicines used during the test).  ° °SYMPTOMS TO REPORT IMMEDIATELY: °A gastroenterologist can be reached at any hour. Please call [phone] for any of the following symptoms:  °Following lower endoscopy (colonoscopy, flexible sigmoidoscopy) °Excessive amounts of blood in the stool  °Significant tenderness, worsening of abdominal pains  °Swelling of the abdomen that is new, acute  °Fever of 100° or higher  °Following upper endoscopy (EGD, EUS, ERCP, esophageal dilation) °Vomiting of blood or coffee ground material  °New, significant abdominal pain  °New, significant chest pain or pain under the shoulder blades  °Painful or persistently difficult swallowing  °New shortness of breath  °Black, tarry-looking or red, bloody stools ° °FOLLOW UP:  °If any biopsies were taken you will be contacted by phone or by letter within the next 1-3 weeks. Call [phone]  if you have not heard about the biopsies in 3 weeks.  °Please also call with any specific questions about appointments or follow up tests. ° ° °

## 2016-11-15 NOTE — Interval H&P Note (Signed)
History and Physical Interval Note:  11/15/2016 8:06 AM  Austin [name]  has presented today for surgery, with the diagnosis of duodenal stenosis  The various methods of treatment have been discussed with the patient and family. After consideration of risks, benefits and other options for treatment, the patient has consented to  Procedure(s): ESOPHAGOGASTRODUODENOSCOPY (EGD) (N/A) BALLOON DILATION (N/A) as a surgical intervention .  The patient's history has been reviewed, patient examined, no change in status, stable for surgery.  I have reviewed the patient's chart and labs.  Questions were answered to the patient's satisfaction.     Viviann Spare P Tashawna Thom

## 2016-11-15 NOTE — Transfer of Care (Signed)
Immediate Anesthesia Transfer of Care Note  Patient: Austin [name]  Procedure(s) Performed: Procedure(s): ESOPHAGOGASTRODUODENOSCOPY (EGD) (N/A) BALLOON DILATION (N/A)  Patient Location: PACU  Anesthesia Type:MAC  Level of Consciousness:  sedated, patient cooperative and responds to stimulation  Airway & Oxygen Therapy:Patient Spontanous Breathing and Patient connected to face mask oxgen  Post-op Assessment:  Report given to PACU RN and Post -op Vital signs reviewed and stable  Post vital signs:  Reviewed and stable  Last Vitals:  Vitals:   11/15/16 0720  BP: (!) 127/99  Pulse: 92  Resp: 20  Temp: 36.8 C  SpO2: 72%    Complications: No apparent anesthesia complications

## 2016-11-15 NOTE — Anesthesia Postprocedure Evaluation (Signed)
Anesthesia Post Note  Patient: Austin [name]  Procedure(s) Performed: Procedure(s) (LRB): ESOPHAGOGASTRODUODENOSCOPY (EGD) (N/A) BALLOON DILATION (N/A)     Patient location during evaluation: PACU Anesthesia Type: MAC Level of consciousness: awake and alert Pain management: pain level controlled Vital Signs Assessment: post-procedure vital signs reviewed and stable Respiratory status: spontaneous breathing, nonlabored ventilation, respiratory function stable and patient connected to nasal cannula oxygen Cardiovascular status: stable and blood pressure returned to baseline Postop Assessment: no apparent nausea or vomiting Anesthetic complications: no    Last Vitals:  Vitals:   11/15/16 0720 11/15/16 0841  BP: (!) 127/99 102/84  Pulse: 92 95  Resp: 20 (!) 24  Temp: 36.8 C (!) 36.4 C  SpO2: 96% 97%    Last Pain:  Vitals:   11/15/16 0841  TempSrc: Oral  PainSc:                  Farran Amsden S

## 2016-11-15 NOTE — Op Note (Signed)
[facility] Patient Name: Austin [name] Procedure Date: 11/15/2016 MRN: [id number] Attending MD: Carlota Raspberry. Armbruster MD, MD Date of Birth: Apr 28, 1965 CSN: [id number] Age: 52 Admit Type: Outpatient Procedure:                Upper GI endoscopy Indications:              For therapy of duodenal ulcer related stenosis /                            partial gastric outlet obstruction Providers:                Remo Lipps P. Armbruster MD, MD, Burtis Junes, RN, Marcene Duos, Technician Referring MD:              Medicines:                Monitored Anesthesia Care Complications:            No immediate complications. Estimated blood loss:                            Minimal. Estimated Blood Loss:     Estimated blood loss was minimal. Procedure:                Pre-Anesthesia Assessment:                           - Prior to the procedure, a History and Physical                            was performed, and patient medications and                            allergies were reviewed. The patient's tolerance of                            previous anesthesia was also reviewed. The risks                            and benefits of the procedure and the sedation                            options and risks were discussed with the patient.                            All questions were answered, and informed consent                            was obtained. Prior Anticoagulants: The patient has                            taken no previous anticoagulant or antiplatelet  agents. ASA Grade Assessment: II - A patient with                            mild systemic disease. After reviewing the risks                            and benefits, the patient was deemed in                            satisfactory condition to undergo the procedure.                           After obtaining informed consent, the endoscope was                            passed  under direct vision. Throughout the                            procedure, the patient's blood pressure, pulse, and                            oxygen saturations were monitored continuously. The                            EG-[id number] ([id number]) scope was introduced through the                            mouth, and advanced to the second part of duodenum.                            The upper GI endoscopy was accomplished without                            difficulty. The patient tolerated the procedure                            well. Scope In: Scope Out: Findings:      Esophagogastric landmarks were identified: the Z-line was found at 42       cm, the gastroesophageal junction was found at 42 cm and the upper       extent of the gastric folds was found at 42 cm from the incisors.      The exam of the esophagus was otherwise normal.      A medium amount of food was found in the gastric body consistent with       partial outlet obstruction. Gastric body not completely evaluated in       this light.      One superficial gastric ulcer was found in the gastric antrum. The       lesion was 3 mm in largest dimension - it was smaller than previously       visualized and appears to be healing.      The pylorus / antrum is scarred and deformed from prior ulcerations. The       exam of the stomach was otherwise normal.  An acquired benign-appearing, intrinsic severe stenosis was found in the       duodenal bulb and was not able to be traversed prior to dilation. It is       extremely angulated. The previously noted duodenal ulcer has healed. The       stenosis was roughly 61m wide or so. A TTS dilator was passed through       the scope. Dilation with a 12-13.5-15 mm pyloric balloon dilator was       performed to 121mwith a good result and appropriate mucosal wrent. The       duodenum was able to be traversed and visualized after this intervention.      The exam of the duodenum was otherwise  normal. Impression:               - Esophagogastric landmarks identified.                           - Normal esophagus                           - A medium amount of food in the stomach consistent                            with partial outlet obstruction.                           - Healing gastric ulcer with deformed antrum from                            prior ulcerations / scarring.                           - Severe duodenal bulb stenosis with interval                            healing of duodenal ulcer. Dilated to 1582mModerate Sedation:      No moderate sedation, case performed with MAC Recommendation:           - Patient has a contact number available for                            emergencies. The signs and symptoms of potential                            delayed complications were discussed with the                            patient. Return to normal activities tomorrow.                            Written discharge instructions were provided to the                            patient.                           -  Full liquid diet today, then maintain on soft diet                           - Continue present medications.                           - Repeat upper endoscopy in 6-8 weeks to evaluate                            the response to therapy, ensure complete healing of                            ulcer, perform additional dilation if needed. Procedure Code(s):        --- Professional ---                           [phone], Esophagogastroduodenoscopy, flexible,                            transoral; with dilation of gastric/duodenal                            stricture(s) (eg, balloon, bougie) Diagnosis Code(s):        --- Professional ---                           K25.9, Gastric ulcer, unspecified as acute or                            chronic, without hemorrhage or perforation                           K31.5, Obstruction of duodenum                           K26.4, Chronic or  unspecified duodenal ulcer with                            hemorrhage CPT copyright 2016 [employer]. All rights reserved. The codes documented in this report are preliminary and upon coder review may  be revised to meet current compliance requirements. Remo Lipps P. Armbruster MD, MD 11/15/2016 8:47:35 AM This report has been signed electronically. Number of Addenda: 0

## 2016-11-15 NOTE — H&P (Signed)
HPI:   Austin [name] is a 51 y.o. 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 He had an EGD about a month ago, dilated stenosis to 1cm. He has been on a soft diet and PPI since that time, generally doing okay, he still has some occasional vomiting. Here for repeat EGD with further dilation.   Past Medical History:  Diagnosis Date  . Anxiety   . Gastric ulcer   . GERD (gastroesophageal reflux disease)   . Headache(784.0)    hx of tension-none recent  . History of kidney stones 09/19/12   left ureteral stone-SURGERY 8/6 URETEROSCOPY / STENT PLACEMENT     Past Surgical History:  Procedure Laterality Date  . CYSTO, LEFT RGP, DIAGNOSTIC URETEROSCOPY AND STENT PLACEMENT  09/24/2012  . CYSTOSCOPY WITH RETROGRADE PYELOGRAM, URETEROSCOPY AND STENT PLACEMENT Left 09/24/2012   Procedure: CYSTOSCOPY WITH RETROGRADE PYELOGRAM, DIAGNOSTIC URETEROSCOPY AND STENT PLACEMENT;  Surgeon: Sebastian Ache, MD;  Location: [facility];  Service: Urology;  Laterality: Left;  . CYSTOSCOPY WITH RETROGRADE PYELOGRAM, URETEROSCOPY AND STENT PLACEMENT Left 10/08/2012   Procedure: CYSTOSCOPY WITH RETROGRADE PYELOGRAM, URETEROSCOPY AND STENT PLACEMENT;  Surgeon: Sebastian Ache, MD;  Location: [facility];  Service: Urology;  Laterality: Left;  1 HR NEEDS DIGITAL URETEROSCOPE   . ESOPHAGOGASTRODUODENOSCOPY N/A 09/08/2016   Procedure: ESOPHAGOGASTRODUODENOSCOPY (EGD);  Surgeon: Ruffin Frederick, MD;  Location: [facility];  Service: Gastroenterology;  Laterality: N/A;  . ESOPHAGOGASTRODUODENOSCOPY (EGD) WITH PROPOFOL N/A 10/15/2016   Procedure: ESOPHAGOGASTRODUODENOSCOPY (EGD) WITH PROPOFOL; Duodenal dilation;  Surgeon: Ruffin Frederick, MD;  Location: [facility];  Service: Gastroenterology;  Laterality: N/A;  . HOLMIUM LASER APPLICATION Left 10/08/2012   Procedure: HOLMIUM LASER APPLICATION;  Surgeon: Sebastian Ache, MD;  Location: [facility];  Service: Urology;  Laterality: Left;  .  TONSILLECTOMY     t&a as a child  . wisdom teeth extracted      Family History  Problem Relation Age of Onset  . Heart disease Mother   . Ulcerative colitis Father     Social History  Substance Use Topics  . Smoking status: Current Every Day Smoker    Packs/day: 0.80    Years: 28.00    Types: Cigarettes  . Smokeless tobacco: Never Used  . Alcohol use No    Prior to Admission medications   Medication Sig Start Date End Date Taking? Authorizing Provider  acetaminophen (TYLENOL) 325 MG tablet Take 325 mg by mouth every 6 (six) hours as needed for mild pain or headache.   Yes [provider]  pantoprazole (PROTONIX) 40 MG tablet Take 1 tablet (40 mg total) by mouth 2 (two) times daily. 10/11/16 10/11/17 Yes Esterwood, Amy S, PA-C  sucralfate (CARAFATE) 1 g tablet Take 1 tablet (1 g total) by mouth 4 (four) times daily -  with meals and at bedtime. 10/11/16  Yes Esterwood, Amy S, PA-C    Current Facility-Administered Medications  Medication Dose Route Frequency Provider Last Rate Last Dose  . lactated ringers infusion   Intravenous Continuous Sherlonda Flater, Willaim Rayas, MD 20 mL/hr at 11/15/16 0724      Allergies as of 10/18/2016  . (No Known Allergies)     Review of Systems:    As per HPI, otherwise negative    Physical Exam:  Vital signs in last 24 hours: Temp:  [98.2 F (36.8 C)] 98.2 F (36.8 C) (09/27 0720) Pulse Rate:  [92] 92 (09/27 0720) Resp:  [  20] 20 (09/27 0720) BP: (127)/(99) 127/99 (09/27 0720) SpO2:  [96 %] 96 % (09/27 0720) Weight:  [160 lb (72.6 kg)] 160 lb (72.6 kg) (09/27 0720)   General:   Pleasant male in NAD Lungs:  Respirations even and unlabored. Lungs clear to auscultation bilaterally.   No wheezes, crackles, or rhonchi.  Heart:  Regular rate and rhythm; no MRG Abdomen:  Soft, nondistended, nontender.  No appreciable masses or hepatomegaly.  Extremities:  Without edema.  LAB RESULTS: No results for input(s): WBC, HGB, HCT, PLT in the last  72 hours. BMET No results for input(s): NA, K, CL, CO2, GLUCOSE, BUN, CREATININE, CALCIUM in the last 72 hours. LFT No results for input(s): PROT, ALBUMIN, AST, ALT, ALKPHOS, BILITOT, BILIDIR, IBILI in the last 72 hours. PT/INR No results for input(s): LABPROT, INR in the last 72 hours.  STUDIES: No results found.   PREVIOUS ENDOSCOPIES:               Impression / Plan:  51 y/o male with NSAID related PUD, here for EGD with dilation of duodenal stenosis. I have discussed risks / benefits of the procedure, dilation, and anesthesia with him. He wishes to proceed. Further recommendations pending the results.   Ileene Patrick, MD [facility] Pager [phone]

## 2016-11-16 ENCOUNTER — Encounter (HOSPITAL_COMMUNITY): Payer: Self-pay | Admitting: Gastroenterology

## 2016-12-04 ENCOUNTER — Encounter: Payer: Managed Care, Other (non HMO) | Admitting: Gastroenterology

## 2016-12-21 ENCOUNTER — Other Ambulatory Visit: Payer: Self-pay

## 2016-12-21 ENCOUNTER — Telehealth: Payer: Self-pay

## 2016-12-21 DIAGNOSIS — K315 Obstruction of duodenum: Secondary | ICD-10-CM

## 2016-12-21 DIAGNOSIS — K311 Adult hypertrophic pyloric stenosis: Secondary | ICD-10-CM

## 2016-12-21 NOTE — Telephone Encounter (Signed)
Thanks Julie

## 2016-12-21 NOTE — Telephone Encounter (Signed)
Patient notified of scheduled procedure on 11/12 at [facility] at 07:30 to arrive at 06:00 am. Mailed prep instructions.

## 2016-12-24 ENCOUNTER — Encounter (HOSPITAL_COMMUNITY): Payer: Self-pay | Admitting: *Deleted

## 2016-12-31 ENCOUNTER — Ambulatory Visit (HOSPITAL_COMMUNITY): Payer: Managed Care, Other (non HMO) | Admitting: Registered Nurse

## 2016-12-31 ENCOUNTER — Encounter (HOSPITAL_COMMUNITY): Payer: Self-pay

## 2016-12-31 ENCOUNTER — Ambulatory Visit (HOSPITAL_COMMUNITY)
Admission: RE | Admit: 2016-12-31 | Discharge: 2016-12-31 | Disposition: A | Discharge status: Home / Self Care | Payer: Managed Care, Other (non HMO) | Source: Ambulatory Visit | Attending: Gastroenterology | Admitting: Gastroenterology

## 2016-12-31 ENCOUNTER — Encounter (HOSPITAL_COMMUNITY)
Admission: RE | Disposition: A | Discharge status: Home / Self Care | Payer: Self-pay | Source: Ambulatory Visit | Attending: Gastroenterology

## 2016-12-31 DIAGNOSIS — F17210 Nicotine dependence, cigarettes, uncomplicated: Secondary | ICD-10-CM | POA: Diagnosis not present

## 2016-12-31 DIAGNOSIS — K219 Gastro-esophageal reflux disease without esophagitis: Secondary | ICD-10-CM | POA: Diagnosis not present

## 2016-12-31 DIAGNOSIS — Z79899 Other long term (current) drug therapy: Secondary | ICD-10-CM | POA: Diagnosis not present

## 2016-12-31 DIAGNOSIS — Z8719 Personal history of other diseases of the digestive system: Secondary | ICD-10-CM | POA: Diagnosis not present

## 2016-12-31 DIAGNOSIS — K311 Adult hypertrophic pyloric stenosis: Secondary | ICD-10-CM

## 2016-12-31 DIAGNOSIS — Z8379 Family history of other diseases of the digestive system: Secondary | ICD-10-CM | POA: Diagnosis not present

## 2016-12-31 DIAGNOSIS — K315 Obstruction of duodenum: Secondary | ICD-10-CM | POA: Diagnosis not present

## 2016-12-31 DIAGNOSIS — Z87442 Personal history of urinary calculi: Secondary | ICD-10-CM | POA: Diagnosis not present

## 2016-12-31 DIAGNOSIS — Z8249 Family history of ischemic heart disease and other diseases of the circulatory system: Secondary | ICD-10-CM | POA: Insufficient documentation

## 2016-12-31 DIAGNOSIS — F419 Anxiety disorder, unspecified: Secondary | ICD-10-CM | POA: Insufficient documentation

## 2016-12-31 DIAGNOSIS — K3189 Other diseases of stomach and duodenum: Secondary | ICD-10-CM | POA: Diagnosis not present

## 2016-12-31 DIAGNOSIS — K259 Gastric ulcer, unspecified as acute or chronic, without hemorrhage or perforation: Secondary | ICD-10-CM | POA: Insufficient documentation

## 2016-12-31 HISTORY — PX: SAVORY DILATION: SHX5439

## 2016-12-31 HISTORY — DX: Guillain-Barre syndrome: G61.0

## 2016-12-31 HISTORY — PX: ESOPHAGOGASTRODUODENOSCOPY: SHX5428

## 2016-12-31 SURGERY — EGD (ESOPHAGOGASTRODUODENOSCOPY)
Anesthesia: Monitor Anesthesia Care

## 2016-12-31 MED ORDER — LACTATED RINGERS IV SOLN
INTRAVENOUS | Status: DC
  Administered 2016-12-31: 1000 mL via INTRAVENOUS

## 2016-12-31 MED ORDER — PROPOFOL 10 MG/ML IV BOLUS
INTRAVENOUS | Status: AC
  Filled 2016-12-31: qty 60

## 2016-12-31 MED ORDER — SODIUM CHLORIDE 0.9 % IV SOLN: INTRAVENOUS | Status: DC

## 2016-12-31 MED ORDER — PROPOFOL 10 MG/ML IV BOLUS
INTRAVENOUS | Status: AC
  Filled 2016-12-31: qty 20

## 2016-12-31 MED ORDER — LIDOCAINE HCL (CARDIAC) 20 MG/ML IV SOLN
INTRAVENOUS | Status: DC | PRN
  Administered 2016-12-31: 75 mg via INTRAVENOUS

## 2016-12-31 MED ORDER — PROPOFOL 500 MG/50ML IV EMUL
INTRAVENOUS | Status: DC | PRN
  Administered 2016-12-31: 300 ug/kg/min via INTRAVENOUS

## 2016-12-31 MED ORDER — GLYCOPYRROLATE 0.2 MG/ML IJ SOLN
INTRAMUSCULAR | Status: DC | PRN
  Administered 2016-12-31: 0.2 mg via INTRAVENOUS

## 2016-12-31 NOTE — Op Note (Signed)
[facility] Patient Name: Austin [name] Procedure Date: 12/31/2016 MRN: [id number] Attending MD: Carlota Raspberry. Kaleesi Guyton MD, MD Date of Birth: [birth year] CSN: [id number] Age: 51 Admit Type: Outpatient Procedure:                Upper GI endoscopy Indications:              history of NSAID related PUD of the pylorus and                            duodenum, with associated duodenal stricture,                            previously dilated to 28m and then 151m                            Symptomatic improvement after last dilation, here                            for re-evaluation and further dilation if needed.                            On protonix twice daily Providers:                StRemo Lipps. Aymara Sassi MD, MD, PaCleda DaubRN,                            GuWilliam DaltonTechnician Referring MD:              Medicines:                Monitored Anesthesia Care Complications:            No immediate complications. Estimated blood loss:                            Minimal. Estimated Blood Loss:     Estimated blood loss was minimal. Procedure:                Pre-Anesthesia Assessment:                           - Prior to the procedure, a History and Physical                            was performed, and patient medications and                            allergies were reviewed. The patient's tolerance of                            previous anesthesia was also reviewed. The risks                            and benefits of the procedure and the sedation                            options  and risks were discussed with the patient.                            All questions were answered, and informed consent                            was obtained. Prior Anticoagulants: The patient has                            taken no previous anticoagulant or antiplatelet                            agents. ASA Grade Assessment: II - A patient with                            mild systemic  disease. After reviewing the risks                            and benefits, the patient was deemed in                            satisfactory condition to undergo the procedure.                           After obtaining informed consent, the endoscope was                            passed under direct vision. Throughout the                            procedure, the patient's blood pressure, pulse, and                            oxygen saturations were monitored continuously. The                            Endoscope was introduced through the mouth, and                            advanced to the second part of duodenum. The upper                            GI endoscopy was accomplished without difficulty.                            The patient tolerated the procedure well. Scope In: Scope Out: Findings:      Esophagogastric landmarks were identified: the Z-line was found at 41       cm, the gastroesophageal junction was found at 41 cm and the upper       extent of the gastric folds was found at 41 cm from the incisors.      The exam of the esophagus was otherwise normal.      A deformity (scarring) was found in the gastric antrum at  the pylorus       from prior ulceration      One non-bleeding dimunitive superficial gastric ulcer with no stigmata       of bleeding was found at the pylorus - improved from last exam - this       has been biopsied twice previously and benign, no further biopsies taken.      The exam of the stomach was otherwise normal.      An acquired benign-appearing, widely patent mild stenosis was noted in       the duodenal bulb. Just distal to this, the lumen is extremely angulated       with another intrinsic moderate stenosis was in the duodenal sweep. This       was able to be traversed with the endoscope (previously it was not). A       TTS dilator was passed through the scope. Dilation with a 15 mm and then       16.5 mm pyloric balloon dilator was performed  across both stenosis, with       an appropriate mucosal wrent at the distal stenosis.      The exam of the duodenum was otherwise normal. Impression:               - Esophagogastric landmarks identified.                           - Normal esophagus                           - Scarring of gastric antrum / pyloris due to prior                            ulceration.                           - Diminutive gastric ulcer in the pyloric channel,                            interval healing, prior biopsies negative, no                            further biopsies obtained.                           - Very mild duodenal bulb stenosis.                           - Angulated duodenum with moderate stenosis in the                            duodenal sweep as outlined - able to be traversed,                            improved since the last exam, further dilated to                            16.81m with good result.                           -  Normal distal duodenum. Moderate Sedation:      No moderate sedation, case performed with MAC Recommendation:           - Patient has a contact number available for                            emergencies. The signs and symptoms of potential                            delayed complications were discussed with the                            patient. Return to normal activities tomorrow.                            Written discharge instructions were provided to the                            patient.                           - Resume previous diet.                           - Continue present medications.                           - Continue protonix twice daily for another month,                            then reduce dose to once daily                           - Okay to stop scheduled Carafate, can use as needed                           - Contact me if symptoms recur. Repeat upper                            endoscopy as needed for retreatment moving forward,                             hopefully no further dilations are warranted. Procedure Code(s):        --- Professional ---                           [phone], Esophagogastroduodenoscopy, flexible,                            transoral; with dilation of gastric/duodenal                            stricture(s) (eg, balloon, bougie) Diagnosis Code(s):        --- Professional ---  K31.89, Other diseases of stomach and duodenum                           K25.9, Gastric ulcer, unspecified as acute or                            chronic, without hemorrhage or perforation                           K31.5, Obstruction of duodenum CPT copyright 2016 American Medical Association. All rights reserved. The codes documented in this report are preliminary and upon coder review may  be revised to meet current compliance requirements. Remo Lipps P. Raad Clayson MD, MD 12/31/2016 7:55:21 AM This report has been signed electronically. Number of Addenda: 0

## 2016-12-31 NOTE — Interval H&P Note (Signed)
History and Physical Interval Note:  12/31/2016 7:14 AM  [name]  has presented today for surgery, with the diagnosis of dysphagia  The various methods of treatment have been discussed with the patient and family. After consideration of risks, benefits and other options for treatment, the patient has consented to  Procedure(s): ESOPHAGOGASTRODUODENOSCOPY (EGD) (N/A) POSSIBLE SAVORY DILATION (N/A) as a surgical intervention .  The patient's history has been reviewed, patient examined, no change in status, stable for surgery.  I have reviewed the patient's chart and labs.  Questions were answered to the patient's satisfaction.     Austin [name]

## 2016-12-31 NOTE — Anesthesia Preprocedure Evaluation (Signed)
Anesthesia Evaluation  Patient identified by MRN, date of birth, ID band Patient awake    Reviewed: Allergy & Precautions, NPO status , Patient's Chart, lab work & pertinent test results  Airway Mallampati: II  TM Distance: >3 FB Neck ROM: Full    Dental no notable dental hx.    Pulmonary Current Smoker,    Pulmonary exam normal breath sounds clear to auscultation       Cardiovascular negative cardio ROS Normal cardiovascular exam Rhythm:Regular Rate:Normal     Neuro/Psych Anxiety negative neurological ROS     GI/Hepatic Neg liver ROS, GERD  ,  Endo/Other  negative endocrine ROS  Renal/GU negative Renal ROS  negative genitourinary   Musculoskeletal negative musculoskeletal ROS (+)   Abdominal   Peds negative pediatric ROS (+)  Hematology negative hematology ROS (+)   Anesthesia Other Findings   Reproductive/Obstetrics negative OB ROS                             Anesthesia Physical  Anesthesia Plan  ASA: II  Anesthesia Plan: MAC   Post-op Pain Management:    Induction: Intravenous  PONV Risk Score and Plan: 1 and Ondansetron and Treatment may vary due to age or medical condition  Airway Management Planned: Nasal Cannula  Additional Equipment:   Intra-op Plan:   Post-operative Plan:   Informed Consent: I have reviewed the patients History and Physical, chart, labs and discussed the procedure including the risks, benefits and alternatives for the proposed anesthesia with the patient or authorized representative who has indicated his/her understanding and acceptance.   Dental advisory given  Plan Discussed with: CRNA and Surgeon  Anesthesia Plan Comments:         Anesthesia Quick Evaluation

## 2016-12-31 NOTE — Transfer of Care (Signed)
Immediate Anesthesia Transfer of Care Note  Patient: Austin [name]  Procedure(s) Performed: ESOPHAGOGASTRODUODENOSCOPY (EGD) (N/A ) POSSIBLE SAVORY DILATION (N/A )  Patient Location: PACU  Anesthesia Type:MAC  Level of Consciousness: awake, alert , oriented and patient cooperative  Airway & Oxygen Therapy: Patient Spontanous Breathing and Patient connected to nasal cannula oxygen  Post-op Assessment: Report given to RN, Post -op Vital signs reviewed and stable and Patient moving all extremities X 4  Post vital signs: stable  Last Vitals:  Vitals:   12/31/16 0620 12/31/16 0749  BP: 119/88 95/69  Pulse: 76 93  Resp: 14 (!) 23  Temp: 36.7 C 36.5 C  SpO2: 95% 98%    Last Pain:  Vitals:   12/31/16 0749  TempSrc: Oral         Complications: No apparent anesthesia complications

## 2016-12-31 NOTE — H&P (Signed)
HPI:   Austin [name] is a 52 y.o. 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 Initial follow up EGD showed tight stenosis, dilated to 56mm. Last EGD in Sept, duodenal stenosis was dilated to 6mm. Since that time he denies much of any postprandial symptoms. No further abdominal pain or vomiting. He is taking protonix twice daily and carafate PRN. Here for follow up EGD, ensure healing of ulcer and dilate stenosis further if needed.     Past Medical History:  Diagnosis Date  . Anxiety   . Gastric ulcer   . GERD (gastroesophageal reflux disease)   . Guillain Barr syndrome (HCC) 1984   COMPLETE RECOVERY  . Headache(784.0)    hx of tension-none recent  . History of kidney stones 09/19/12   left ureteral stone-SURGERY 8/6 URETEROSCOPY / STENT PLACEMENT     Past Surgical History:  Procedure Laterality Date  . CYSTO, LEFT RGP, DIAGNOSTIC URETEROSCOPY AND STENT PLACEMENT  09/24/2012  . TONSILLECTOMY     t&a as a child  . wisdom teeth extracted      Family History  Problem Relation Age of Onset  . Heart disease Mother   . Ulcerative colitis Father      Social History   Tobacco Use  . Smoking status: Current Every Day Smoker    Packs/day: 0.80    Years: 28.00    Pack years: 22.40    Types: Cigarettes  . Smokeless tobacco: Never Used  Substance Use Topics  . Alcohol use: No  . Drug use: No    Comment: in the past    Prior to Admission medications   Medication Sig Start Date End Date Taking? Authorizing Provider  acetaminophen (TYLENOL) 500 MG tablet Take 1,000 mg 2 (two) times daily as needed by mouth for moderate pain.   Yes [provider]  pantoprazole (PROTONIX) 40 MG tablet Take 1 tablet (40 mg total) by mouth 2 (two) times daily. 10/11/16 10/11/17 Yes Esterwood, Amy S, PA-C  sucralfate (CARAFATE) 1 g tablet Take 1 tablet (1 g total) by mouth 4 (four)  times daily -  with meals and at bedtime. 10/11/16  Yes Esterwood, Amy S, PA-C    Current Facility-Administered Medications  Medication Dose Route Frequency Provider Last Rate Last Dose  . 0.9 %  sodium chloride infusion   Intravenous Continuous Krysti Hickling, Willaim Rayas, MD      . lactated ringers infusion   Intravenous Continuous Adela Lank Willaim Rayas, MD 125 mL/hr at 12/31/16 0637 1,000 mL at 12/31/16 0637    Allergies as of 12/21/2016  . (No Known Allergies)     Review of Systems:    As per HPI, otherwise negative    Physical Exam:  Vital signs in last 24 hours: Temp:  [98 F (36.7 C)] 98 F (36.7 C) (11/12 0620) Pulse Rate:  [76] 76 (11/12 0620) Resp:  [14] 14 (11/12 0620) BP: (119)/(88) 119/88 (11/12 0620) SpO2:  [95 %] 95 % (11/12 0620) Weight:  [165 lb (74.8 kg)] 165 lb (74.8 kg) (11/12 0620)   General:   Pleasant male in NAD Lungs:  Respirations even and unlabored. Lungs clear to auscultation bilaterally.   No wheezes, crackles, or rhonchi.  Heart:  Regular rate and rhythm; no MRG Abdomen:  Soft, nondistended, nontender. No appreciable masses or hepatomegaly.  Extremities:  Without edema.  LAB  RESULTS: No results for input(s): WBC, HGB, HCT, PLT in the last 72 hours. BMET No results for input(s): NA, K, CL, CO2, GLUCOSE, BUN, CREATININE, CALCIUM in the last 72 hours. LFT No results for input(s): PROT, ALBUMIN, AST, ALT, ALKPHOS, BILITOT, BILIDIR, IBILI in the last 72 hours. PT/INR No results for input(s): LABPROT, INR in the last 72 hours.  STUDIES: No results found.      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 I have discussed risks / benefits of EGD and dilation with him, he wishes to proceed. Further recommendations pending the results.  Ileene Patrick, MD [facility] Pager  [phone]

## 2016-12-31 NOTE — Discharge Instructions (Signed)
YOU HAD AN ENDOSCOPIC PROCEDURE TODAY: Refer to the procedure report and other information in the discharge instructions given to you for any specific questions about what was found during the examination. If this information does not answer your questions, please call [place] office at [phone] to clarify.  ° °YOU SHOULD EXPECT: Some feelings of bloating in the abdomen. Passage of more gas than usual. Walking can help get rid of the air that was put into your GI tract during the procedure and reduce the bloating. If you had a lower endoscopy (such as a colonoscopy or flexible sigmoidoscopy) you may notice spotting of blood in your stool or on the toilet paper. Some abdominal soreness may be present for a day or two, also. ° °DIET: Your first meal following the procedure should be a light meal and then it is ok to progress to your normal diet. A half-sandwich or bowl of soup is an example of a good first meal. Heavy or fried foods are harder to digest and may make you feel nauseous or bloated. Drink plenty of fluids but you should avoid alcoholic beverages for 24 hours. If you had a esophageal dilation, please see attached instructions for diet.   ° °ACTIVITY: Your care partner should take you home directly after the procedure. You should plan to take it easy, moving slowly for the rest of the day. You can resume normal activity the day after the procedure however YOU SHOULD NOT DRIVE, use power tools, machinery or perform tasks that involve climbing or major physical exertion for 24 hours (because of the sedation medicines used during the test).  ° °SYMPTOMS TO REPORT IMMEDIATELY: °A gastroenterologist can be reached at any hour. Please call [phone]  for any of the following symptoms:  °Following lower endoscopy (colonoscopy, flexible sigmoidoscopy) °Excessive amounts of blood in the stool  °Significant tenderness, worsening of abdominal pains  °Swelling of the abdomen that is new, acute  °Fever of 100° or  higher  °Following upper endoscopy (EGD, EUS, ERCP, esophageal dilation) °Vomiting of blood or coffee ground material  °New, significant abdominal pain  °New, significant chest pain or pain under the shoulder blades  °Painful or persistently difficult swallowing  °New shortness of breath  °Black, tarry-looking or red, bloody stools ° °FOLLOW UP:  °If any biopsies were taken you will be contacted by phone or by letter within the next 1-3 weeks. Call [phone]  if you have not heard about the biopsies in 3 weeks.  °Please also call with any specific questions about appointments or follow up tests. ° °

## 2016-12-31 NOTE — Anesthesia Procedure Notes (Signed)
Procedure Name: MAC Date/Time: 12/31/2016 7:17 AM Performed by: Lissa Morales, CRNA Pre-anesthesia Checklist: Patient identified, Emergency Drugs available, Suction available and Patient being monitored Patient Re-evaluated:Patient Re-evaluated prior to induction Oxygen Delivery Method: Nasal cannula Number of attempts: 1 Airway Equipment and Method: Oral airway Placement Confirmation: positive ETCO2 Dental Injury: Teeth and Oropharynx as per pre-operative assessment

## 2016-12-31 NOTE — Anesthesia Postprocedure Evaluation (Signed)
Anesthesia Post Note  Patient: Austin [name]  Procedure(s) Performed: ESOPHAGOGASTRODUODENOSCOPY (EGD) (N/A ) POSSIBLE SAVORY DILATION (N/A )     Patient location during evaluation: PACU Anesthesia Type: MAC Level of consciousness: awake and alert Pain management: pain level controlled Vital Signs Assessment: post-procedure vital signs reviewed and stable Respiratory status: spontaneous breathing, nonlabored ventilation and respiratory function stable Cardiovascular status: stable and blood pressure returned to baseline Postop Assessment: no apparent nausea or vomiting Anesthetic complications: no    Last Vitals:  Vitals:   12/31/16 0815 12/31/16 0820  BP: 110/87 110/87  Pulse: 80 79  Resp: 14 13  Temp:    SpO2: 97% 95%    Last Pain:  Vitals:   12/31/16 0749  TempSrc: Oral                 Lynda Rainwater

## 2017-01-01 ENCOUNTER — Encounter (HOSPITAL_COMMUNITY): Payer: Self-pay | Admitting: Gastroenterology

## 2017-11-04 ENCOUNTER — Other Ambulatory Visit: Payer: Self-pay | Admitting: Physician Assistant

## 2017-11-04 DIAGNOSIS — R112 Nausea with vomiting, unspecified: Secondary | ICD-10-CM

## 2017-11-04 DIAGNOSIS — R1084 Generalized abdominal pain: Secondary | ICD-10-CM

## 2018-05-16 ENCOUNTER — Telehealth: Payer: Self-pay

## 2018-05-16 NOTE — Telephone Encounter (Signed)
Spoke with patient. A webex appointment has been scheduled. Patient is aware to check their email for instructions and link.  

## 2018-05-22 NOTE — Progress Notes (Deleted)
Virtual Visit via Video Note  I connected with@ on 05/22/18 at  1:00 PM EDT by a video enabled telemedicine application and verified that I am speaking with the correct person using two identifiers.  Location patient: home Location provider:work or home office Persons participating in the virtual visit: patient, provider  I discussed the limitations of evaluation and management by telemedicine and the availability of in person appointments. The patient expressed understanding and agreed to proceed.   Austin [name] DOB: May 29, 1965 Encounter date: 05/23/2018  This isa 53 y.o. male who presents to establish care. No chief complaint on file.   History of present illness:  ***   Past Medical History:  Diagnosis Date  . Anxiety   . Gastric ulcer   . GERD (gastroesophageal reflux disease)   . Guillain Barr syndrome (HCC) 1984   COMPLETE RECOVERY  . Headache(784.0)    hx of tension-none recent  . History of kidney stones 09/19/12   left ureteral stone-SURGERY 8/6 URETEROSCOPY / STENT PLACEMENT    Past Surgical History:  Procedure Laterality Date  . BALLOON DILATION N/A 11/15/2016   Procedure: BALLOON DILATION;  Surgeon: Benancio Deeds, MD;  Location: [facility];  Service: Gastroenterology;  Laterality: N/A;  . CYSTO, LEFT RGP, DIAGNOSTIC URETEROSCOPY AND STENT PLACEMENT  09/24/2012  . CYSTOSCOPY WITH RETROGRADE PYELOGRAM, URETEROSCOPY AND STENT PLACEMENT Left 09/24/2012   Procedure: CYSTOSCOPY WITH RETROGRADE PYELOGRAM, DIAGNOSTIC URETEROSCOPY AND STENT PLACEMENT;  Surgeon: Sebastian Ache, MD;  Location: [facility];  Service: Urology;  Laterality: Left;  . CYSTOSCOPY WITH RETROGRADE PYELOGRAM, URETEROSCOPY AND STENT PLACEMENT Left 10/08/2012   Procedure: CYSTOSCOPY WITH RETROGRADE PYELOGRAM, URETEROSCOPY AND STENT PLACEMENT;  Surgeon: Sebastian Ache, MD;  Location: [facility];  Service: Urology;  Laterality: Left;  1 HR NEEDS DIGITAL URETEROSCOPE   . ESOPHAGOGASTRODUODENOSCOPY N/A  09/08/2016   Procedure: ESOPHAGOGASTRODUODENOSCOPY (EGD);  Surgeon: Ruffin Frederick, MD;  Location: [facility];  Service: Gastroenterology;  Laterality: N/A;  . ESOPHAGOGASTRODUODENOSCOPY N/A 11/15/2016   Procedure: ESOPHAGOGASTRODUODENOSCOPY (EGD);  Surgeon: Benancio Deeds, MD;  Location: [facility];  Service: Gastroenterology;  Laterality: N/A;  . ESOPHAGOGASTRODUODENOSCOPY N/A 12/31/2016   Procedure: ESOPHAGOGASTRODUODENOSCOPY (EGD);  Surgeon: Benancio Deeds, MD;  Location: [facility];  Service: Gastroenterology;  Laterality: N/A;  . ESOPHAGOGASTRODUODENOSCOPY (EGD) WITH PROPOFOL N/A 10/15/2016   Procedure: ESOPHAGOGASTRODUODENOSCOPY (EGD) WITH PROPOFOL; Duodenal dilation;  Surgeon: Ruffin Frederick, MD;  Location[facility];  Service: Gastroenterology;  Laterality: N/A;  . HOLMIUM LASER APPLICATION Left 10/08/2012   Procedure: HOLMIUM LASER APPLICATION;  Surgeon: Sebastian Ache, MD;  Location: [facility];  Service: Urology;  Laterality: Left;  . SAVORY DILATION N/A 12/31/2016   Procedure: POSSIBLE SAVORY DILATION;  Surgeon: Benancio Deeds, MD;  Location: [facility];  Service: Gastroenterology;  Laterality: N/A;  . TONSILLECTOMY     t&a as a child  . wisdom teeth extracted     No Known Allergies No outpatient medications have been marked as taking for the 05/23/18 encounter (Appointment) with Wynn Banker, MD.   Social History   Tobacco Use  . Smoking status: Current Every Day Smoker    Packs/day: 0.80    Years: 28.00    Pack years: 22.40    Types: Cigarettes  . Smokeless tobacco: Never Used  Substance Use Topics  . Alcohol use: No   Family History  Problem Relation Age of Onset  . Heart disease Mother   . Ulcerative colitis Father      Review of Systems  Objective:  There  were no vitals taken for this visit.      BP Readings from Last 3 Encounters:  12/31/16 110/87  11/15/16 118/85  10/15/16 114/64   Wt Readings from Last 3  Encounters:  12/31/16 165 lb (74.8 kg)  11/15/16 160 lb (72.6 kg)  10/15/16 155 lb 6.4 oz (70.5 kg)    EXAM:  GENERAL: alert, oriented, appears well and in no acute distress  HEENT: atraumatic, conjunctiva clear, no obvious abnormalities on inspection of external nose and ears  NECK: normal movements of the head and neck  LUNGS: on inspection no signs of respiratory distress, breathing rate appears normal, no obvious gross SOB, gasping or wheezing  CV: no obvious cyanosis  MS: moves all visible extremities without noticeable abnormality  PSYCH/NEURO: pleasant and cooperative, no obvious depression or anxiety, speech and thought processing grossly intact ***  Assessment/Plan  There are no diagnoses linked to this encounter.     I discussed the assessment and treatment plan with the patient. The patient was provided an opportunity to ask questions and all were answered. The patient agreed with the plan and demonstrated an understanding of the instructions.   The patient was advised to call back or seek an in-person evaluation if the symptoms worsen or if the condition fails to improve as anticipated.  I provided *** minutes of non-face-to-face time during this encounter.   Theodis Shove, MD

## 2018-05-23 ENCOUNTER — Ambulatory Visit: Payer: Managed Care, Other (non HMO) | Admitting: Family Medicine

## 2018-06-02 ENCOUNTER — Ambulatory Visit (INDEPENDENT_AMBULATORY_CARE_PROVIDER_SITE_OTHER): Payer: Managed Care, Other (non HMO)

## 2018-06-02 ENCOUNTER — Encounter: Payer: Self-pay | Admitting: Family Medicine

## 2018-06-02 ENCOUNTER — Other Ambulatory Visit (INDEPENDENT_AMBULATORY_CARE_PROVIDER_SITE_OTHER): Payer: Managed Care, Other (non HMO)

## 2018-06-02 ENCOUNTER — Other Ambulatory Visit: Payer: Self-pay

## 2018-06-02 ENCOUNTER — Ambulatory Visit (INDEPENDENT_AMBULATORY_CARE_PROVIDER_SITE_OTHER): Payer: Managed Care, Other (non HMO) | Admitting: Family Medicine

## 2018-06-02 DIAGNOSIS — R0602 Shortness of breath: Secondary | ICD-10-CM

## 2018-06-02 DIAGNOSIS — Z13220 Encounter for screening for lipoid disorders: Secondary | ICD-10-CM | POA: Diagnosis not present

## 2018-06-02 DIAGNOSIS — D509 Iron deficiency anemia, unspecified: Secondary | ICD-10-CM

## 2018-06-02 DIAGNOSIS — R9389 Abnormal findings on diagnostic imaging of other specified body structures: Secondary | ICD-10-CM

## 2018-06-02 DIAGNOSIS — F419 Anxiety disorder, unspecified: Secondary | ICD-10-CM

## 2018-06-02 DIAGNOSIS — Z8349 Family history of other endocrine, nutritional and metabolic diseases: Secondary | ICD-10-CM

## 2018-06-02 DIAGNOSIS — Z131 Encounter for screening for diabetes mellitus: Secondary | ICD-10-CM

## 2018-06-02 DIAGNOSIS — Z1211 Encounter for screening for malignant neoplasm of colon: Secondary | ICD-10-CM

## 2018-06-02 LAB — CBC WITH DIFFERENTIAL/PLATELET
Basophils Absolute: 0 10*3/uL (ref 0.0–0.1)
Basophils Relative: 0.5 % (ref 0.0–3.0)
Eosinophils Absolute: 0.1 10*3/uL (ref 0.0–0.7)
Eosinophils Relative: 1.5 % (ref 0.0–5.0)
HCT: 42.2 % (ref 39.0–52.0)
Hemoglobin: 14.5 g/dL (ref 13.0–17.0)
Lymphocytes Relative: 20 % (ref 12.0–46.0)
Lymphs Abs: 1.7 10*3/uL (ref 0.7–4.0)
MCHC: 34.3 g/dL (ref 30.0–36.0)
MCV: 98.3 fl (ref 78.0–100.0)
Monocytes Absolute: 0.6 10*3/uL (ref 0.1–1.0)
Monocytes Relative: 7 % (ref 3.0–12.0)
Neutro Abs: 6.2 10*3/uL (ref 1.4–7.7)
Neutrophils Relative %: 71 % (ref 43.0–77.0)
Platelets: 255 10*3/uL (ref 150.0–400.0)
RBC: 4.29 Mil/uL (ref 4.22–5.81)
RDW: 13.2 % (ref 11.5–15.5)
WBC: 8.7 10*3/uL (ref 4.0–10.5)

## 2018-06-02 LAB — COMPREHENSIVE METABOLIC PANEL
ALT: 15 U/L (ref 0–53)
AST: 15 U/L (ref 0–37)
Albumin: 4.1 g/dL (ref 3.5–5.2)
Alkaline Phosphatase: 93 U/L (ref 39–117)
BUN: 18 mg/dL (ref 6–23)
CO2: 25 mEq/L (ref 19–32)
Calcium: 9 mg/dL (ref 8.4–10.5)
Chloride: 105 mEq/L (ref 96–112)
Creatinine, Ser: 0.67 mg/dL (ref 0.40–1.50)
GFR: 125.09 mL/min (ref >60.00)
Glucose, Bld: 87 mg/dL (ref 70–99)
Potassium: 3.8 mEq/L (ref 3.5–5.1)
Sodium: 140 mEq/L (ref 135–145)
Total Bilirubin: 1 mg/dL (ref 0.2–1.2)
Total Protein: 6.8 g/dL (ref 6.0–8.3)

## 2018-06-02 LAB — LIPID PANEL
Cholesterol: 203 mg/dL — ABNORMAL HIGH (ref 0–200)
HDL: 36.4 mg/dL — ABNORMAL LOW (ref >39.00)
LDL Cholesterol: 145 mg/dL — ABNORMAL HIGH (ref 0–99)
NonHDL: 166.79
Total CHOL/HDL Ratio: 6
Triglycerides: 110 mg/dL (ref 0.0–149.0)
VLDL: 22 mg/dL (ref 0.0–40.0)

## 2018-06-02 LAB — TSH: TSH: 1.44 u[IU]/mL (ref 0.35–4.50)

## 2018-06-02 LAB — IBC + FERRITIN
Ferritin: 88.2 ng/mL (ref 22.0–322.0)
Iron: 74 ug/dL (ref 42–165)
Saturation Ratios: 26.2 % (ref 20.0–50.0)
Transferrin: 202 mg/dL — ABNORMAL LOW (ref 212.0–360.0)

## 2018-06-02 MED ORDER — CLONAZEPAM 0.5 MG PO TABS: 0.2500 mg | ORAL_TABLET | Freq: Every day | ORAL | 0 refills | Status: DC | PRN

## 2018-06-02 NOTE — Progress Notes (Signed)
Virtual Visit via Video Note  I connected with@ on 06/02/18 at  9:00 AM EDT by a video enabled telemedicine application and verified that I am speaking with the correct person using two identifiers.  Location patient: home Location provider:work or home office Persons participating in the virtual visit: patient, provider  I discussed the limitations of evaluation and management by telemedicine and the availability of in person appointments. The patient expressed understanding and agreed to proceed.   Austin [name] DOB: 14-Jul-1965 Encounter date: 06/02/2018  This isa 53 y.o. male who presents to establish care. Chief Complaint  Patient presents with  . New Patient (Initial Visit)    History of present illness: Has not been following with regular PCP. Has situation that he is uneasy/nervous about.   Scared him enough to quit smoking.   Started a few months ago where he had hard time catching breath. A couple of months ago had episode where breathing was very shallow, couldn't catch breath. Felt like panic attack. Quit smoking; has been over a month now that he has not smoked. Noticed breathing in summer, but more with exertion (like if walking up to third floor of hotel). Started with feeling out of shape; now the feeling is different.   Not sure if happening when stressed/anxious. Best described as being short of breath. Had episode last night. Lasted about an hour.  Has a little cough. Notes more when he tries to deep breath. More of dry cough.   Has had some anxiety in past. Job is stressful and states that he does get stressed easier. Not been on medications in past to help with anxiety.   Episodes have come at different times (last evening was sitting on couch). Sometimes with lying down. Not associated with physical activity.   Works as Production assistant, radio.   Breathing issue hits more in evening. Not always noting every day. During last  week more sporadic. Longest episode lasted about 6 hours. Felt a little clammy, but not noted tightness in chest, not noted palpitations. Not noted shortness of breath between episodes.   Does not exercise on regular basis. Usually out of town 3-4 days/week. Exercise is working in yard or around house. Wife is vegetarian, but he likes steak/potatoes.   Weight has been consistent. Running in 180's (improved after ulcers healed).    Past Medical History:  Diagnosis Date  . Anxiety   . Gastric ulcer   . GERD (gastroesophageal reflux disease)   . Guillain Barr syndrome (HCC) 1984   COMPLETE RECOVERY; triggered from flu illness  . Headache(784.0)    hx of tension-none recent  . Hematemesis 09/07/2016  . History of kidney stones 09/19/12   left ureteral stone-SURGERY 8/6 URETEROSCOPY / STENT PLACEMENT   . Partial gastric outlet obstruction   . UGIB (upper gastrointestinal bleed) 09/07/2016   Past Surgical History:  Procedure Laterality Date  . BALLOON DILATION N/A 11/15/2016   Procedure: BALLOON DILATION;  Surgeon: Benancio Deeds, MD;  Location: [facility];  Service: Gastroenterology;  Laterality: N/A;  . CYSTO, LEFT RGP, DIAGNOSTIC URETEROSCOPY AND STENT PLACEMENT  09/24/2012  . CYSTOSCOPY WITH RETROGRADE PYELOGRAM, URETEROSCOPY AND STENT PLACEMENT Left 09/24/2012   Procedure: CYSTOSCOPY WITH RETROGRADE PYELOGRAM, DIAGNOSTIC URETEROSCOPY AND STENT PLACEMENT;  Surgeon: Sebastian Ache, MD;  Location: [facility];  Service: Urology;  Laterality: Left;  . CYSTOSCOPY WITH RETROGRADE PYELOGRAM, URETEROSCOPY AND STENT PLACEMENT Left 10/08/2012   Procedure: CYSTOSCOPY WITH RETROGRADE PYELOGRAM, URETEROSCOPY AND STENT PLACEMENT;  Surgeon: Sebastian Ache, MD;  Location: [facility];  Service: Urology;  Laterality: Left;  1 HR NEEDS DIGITAL URETEROSCOPE   . ESOPHAGOGASTRODUODENOSCOPY N/A 09/08/2016   Procedure: ESOPHAGOGASTRODUODENOSCOPY (EGD);  Surgeon: Ruffin Frederick, MD;  Location: [facility];   Service: Gastroenterology;  Laterality: N/A;  . ESOPHAGOGASTRODUODENOSCOPY N/A 11/15/2016   Procedure: ESOPHAGOGASTRODUODENOSCOPY (EGD);  Surgeon: Benancio Deeds, MD;  Location: [facility];  Service: Gastroenterology;  Laterality: N/A;  . ESOPHAGOGASTRODUODENOSCOPY N/A 12/31/2016   Procedure: ESOPHAGOGASTRODUODENOSCOPY (EGD);  Surgeon: Benancio Deeds, MD;  Location: [facility];  Service: Gastroenterology;  Laterality: N/A;  . ESOPHAGOGASTRODUODENOSCOPY (EGD) WITH PROPOFOL N/A 10/15/2016   Procedure: ESOPHAGOGASTRODUODENOSCOPY (EGD) WITH PROPOFOL; Duodenal dilation;  Surgeon: Ruffin Frederick, MD;  Location: [facility];  Service: Gastroenterology;  Laterality: N/A;  . HOLMIUM LASER APPLICATION Left 10/08/2012   Procedure: HOLMIUM LASER APPLICATION;  Surgeon: Sebastian Ache, MD;  Location: [facility];  Service: Urology;  Laterality: Left;  . SAVORY DILATION N/A 12/31/2016   Procedure: POSSIBLE SAVORY DILATION;  Surgeon: Benancio Deeds, MD;  Location: [facility];  Service: Gastroenterology;  Laterality: N/A;  . TONSILLECTOMY     t&a as a child  . wisdom teeth extracted     No Known Allergies Current Meds  Medication Sig  . acetaminophen (TYLENOL) 500 MG tablet Take 1,000 mg 2 (two) times daily as needed by mouth for moderate pain.  . pantoprazole (PROTONIX) 40 MG tablet Take 1 tablet (40 mg total) by mouth 2 (two) times daily. Call our office for further refills.   Social History   Tobacco Use  . Smoking status: Former Smoker    Packs/day: 1.50    Years: 28.00    Pack years: 42.00    Types: Cigarettes    Last attempt to quit: 05/02/2018    Years since quitting: 0.0  . Smokeless tobacco: Never Used  Substance Use Topics  . Alcohol use: Yes    Comment: occasional   Family History  Problem Relation Age of Onset  . Heart disease Mother   . Congestive Heart Failure Mother   . Ulcerative colitis Father   . Hemachromatosis Father   . Rheum arthritis Father    . Heart disease Brother 48       stenting     Review of Systems  Constitutional: Negative for chills, fatigue and fever.  Respiratory: Negative for cough, chest tightness, shortness of breath (breathes heavier during episodes documented in HPI) and wheezing.   Cardiovascular: Negative for chest pain, palpitations and leg swelling.  Psychiatric/Behavioral: Negative for sleep disturbance (occasionally has episodes in evening. no snoring). The patient is nervous/anxious (see hpi).     Objective:  There were no vitals taken for this visit.      BP Readings from Last 3 Encounters:  12/31/16 110/87  11/15/16 118/85  10/15/16 114/64   Wt Readings from Last 3 Encounters:  12/31/16 165 lb (74.8 kg)  11/15/16 160 lb (72.6 kg)  10/15/16 155 lb 6.4 oz (70.5 kg)    EXAM:  GENERAL: alert, oriented, appears well and in no acute distress  HEENT: atraumatic, conjunctiva clear, no obvious abnormalities on inspection of external nose and ears  NECK: normal movements of the head and neck  LUNGS: on inspection no signs of respiratory distress, breathing rate appears normal, no obvious gross SOB, gasping or wheezing  CV: no obvious cyanosis  MS: moves all visible extremities without noticeable abnormality  PSYCH/NEURO: pleasant and cooperative, no obvious depression or anxiety, speech and thought processing  grossly intact  Assessment/Plan 1. Screening for colon cancer - Ambulatory referral to Gastroenterology  2. Shortness of breath This does not sound cardiac in nature or related to a more emergent issue like a blood clot.  He is able to continue with his regular activity (mowing lawn) and not getting short of breath or winded with this.  He is able to climb stairs without difficulty in breathing.  We will start with a chest x-ray as well as some basic blood work.  I have given him Klonopin to try when episodes occur, as we discussed there may be some anxiety component to symptoms.   Follow-up pending these results.  We did discuss if he has another episode that does not improve with the Klonopin or is associated with additional symptoms, like tightness in the chest, that he should proceed to the ER for evaluation. - DG Chest 2 View; Future - CBC with Differential/Platelet; Future - TSH; Future  3. Lipid screening - Lipid panel; Future  4. Anxiety This may be a trigger for some of his current symptoms.  We did discuss, however that this would be more of a diagnosis of exclusion.  We will continue to assess as we get more information back on ordered blood work today.  He will give me an update on how Klonopin is working once we get lab work back.Discussed new medication(s) today with patient. Discussed potential side effects and patient verbalized understanding.   5. Family history of hemochromatosis - IBC + Ferritin; Future  6. Screening for diabetes mellitus - Comprehensive metabolic panel; Future  7. Abnormal CXR X-ray results were back prior to completion of this note.  There was some possible vascular congestion on chest x-ray.  It was recommended to repeat imaging for "resolution".  Appears to be any nodules or active infection.  If there is no resolution x-ray CT for further evaluation.    Return pending bloodwork.   I discussed the assessment and treatment plan with the patient. The patient was provided an opportunity to ask questions and all were answered. The patient agreed with the plan and demonstrated an understanding of the instructions.   The patient was advised to call back or seek an in-person evaluation if the symptoms worsen or if the condition fails to improve as anticipated.    Theodis ShoveJunell Koberlein, MD

## 2018-06-04 ENCOUNTER — Encounter: Payer: Self-pay | Admitting: Family Medicine

## 2018-06-04 NOTE — Telephone Encounter (Signed)
I do think it is worthwhile for him to have an in office exam including ekg so we at least have baseline. Unfortunately I am not in tomorrow and booked on Friday.  Could you check with Kandee Keen (since you are working with him) or another provider to see if they might do in office exam and if needed follow up testing?  (I think reasonable to complete cxr, consider BNP, d-dimer with bloodwork along with ekg in office).

## 2018-06-05 ENCOUNTER — Telehealth: Payer: Self-pay | Admitting: Adult Health

## 2018-06-05 ENCOUNTER — Other Ambulatory Visit: Payer: Self-pay

## 2018-06-05 ENCOUNTER — Encounter (HOSPITAL_COMMUNITY): Payer: Self-pay | Admitting: Emergency Medicine

## 2018-06-05 ENCOUNTER — Ambulatory Visit (INDEPENDENT_AMBULATORY_CARE_PROVIDER_SITE_OTHER): Payer: Managed Care, Other (non HMO)

## 2018-06-05 ENCOUNTER — Inpatient Hospital Stay (HOSPITAL_COMMUNITY)
Admission: EM | Admit: 2018-06-05 | Discharge: 2018-06-09 | DRG: 291 | Disposition: A | Discharge status: Home / Self Care | Payer: Managed Care, Other (non HMO) | Attending: Internal Medicine | Admitting: Internal Medicine

## 2018-06-05 ENCOUNTER — Emergency Department (HOSPITAL_COMMUNITY): Payer: Managed Care, Other (non HMO)

## 2018-06-05 ENCOUNTER — Ambulatory Visit: Payer: Managed Care, Other (non HMO) | Admitting: Adult Health

## 2018-06-05 ENCOUNTER — Encounter: Payer: Self-pay | Admitting: Adult Health

## 2018-06-05 VITALS — BP 110/60 | HR 108 | Wt 180.6 lb

## 2018-06-05 DIAGNOSIS — E876 Hypokalemia: Secondary | ICD-10-CM | POA: Diagnosis not present

## 2018-06-05 DIAGNOSIS — K259 Gastric ulcer, unspecified as acute or chronic, without hemorrhage or perforation: Secondary | ICD-10-CM | POA: Diagnosis present

## 2018-06-05 DIAGNOSIS — I420 Dilated cardiomyopathy: Secondary | ICD-10-CM | POA: Diagnosis present

## 2018-06-05 DIAGNOSIS — R0602 Shortness of breath: Secondary | ICD-10-CM

## 2018-06-05 DIAGNOSIS — Z8719 Personal history of other diseases of the digestive system: Secondary | ICD-10-CM

## 2018-06-05 DIAGNOSIS — Z87891 Personal history of nicotine dependence: Secondary | ICD-10-CM

## 2018-06-05 DIAGNOSIS — R7989 Other specified abnormal findings of blood chemistry: Secondary | ICD-10-CM | POA: Diagnosis present

## 2018-06-05 DIAGNOSIS — I110 Hypertensive heart disease with heart failure: Secondary | ICD-10-CM | POA: Diagnosis not present

## 2018-06-05 DIAGNOSIS — R9431 Abnormal electrocardiogram [ECG] [EKG]: Secondary | ICD-10-CM

## 2018-06-05 DIAGNOSIS — I272 Pulmonary hypertension, unspecified: Secondary | ICD-10-CM | POA: Diagnosis present

## 2018-06-05 DIAGNOSIS — J9601 Acute respiratory failure with hypoxia: Secondary | ICD-10-CM | POA: Diagnosis not present

## 2018-06-05 DIAGNOSIS — I5021 Acute systolic (congestive) heart failure: Secondary | ICD-10-CM | POA: Diagnosis present

## 2018-06-05 DIAGNOSIS — Z79899 Other long term (current) drug therapy: Secondary | ICD-10-CM

## 2018-06-05 DIAGNOSIS — Z79890 Hormone replacement therapy (postmenopausal): Secondary | ICD-10-CM

## 2018-06-05 DIAGNOSIS — I340 Nonrheumatic mitral (valve) insufficiency: Secondary | ICD-10-CM | POA: Diagnosis present

## 2018-06-05 DIAGNOSIS — T502X5A Adverse effect of carbonic-anhydrase inhibitors, benzothiadiazides and other diuretics, initial encounter: Secondary | ICD-10-CM | POA: Diagnosis not present

## 2018-06-05 DIAGNOSIS — J432 Centrilobular emphysema: Secondary | ICD-10-CM | POA: Diagnosis present

## 2018-06-05 DIAGNOSIS — Z8669 Personal history of other diseases of the nervous system and sense organs: Secondary | ICD-10-CM

## 2018-06-05 DIAGNOSIS — Z8249 Family history of ischemic heart disease and other diseases of the circulatory system: Secondary | ICD-10-CM

## 2018-06-05 DIAGNOSIS — Y92230 Patient room in hospital as the place of occurrence of the external cause: Secondary | ICD-10-CM | POA: Diagnosis not present

## 2018-06-05 DIAGNOSIS — I509 Heart failure, unspecified: Secondary | ICD-10-CM

## 2018-06-05 DIAGNOSIS — J90 Pleural effusion, not elsewhere classified: Secondary | ICD-10-CM

## 2018-06-05 DIAGNOSIS — F419 Anxiety disorder, unspecified: Secondary | ICD-10-CM | POA: Diagnosis present

## 2018-06-05 DIAGNOSIS — R0902 Hypoxemia: Secondary | ICD-10-CM

## 2018-06-05 DIAGNOSIS — K219 Gastro-esophageal reflux disease without esophagitis: Secondary | ICD-10-CM | POA: Diagnosis present

## 2018-06-05 LAB — BLOOD GAS, VENOUS
Acid-Base Excess: 0 mmol/L (ref 0.0–2.0)
Bicarbonate: 24.8 mmol/L (ref 20.0–28.0)
Drawn by: 257881
FIO2: 100
O2 Saturation: 95.2 %
Patient temperature: 98.6
pCO2, Ven: 43.1 mmHg — ABNORMAL LOW (ref 44.0–60.0)
pH, Ven: 7.378 (ref 7.250–7.430)
pO2, Ven: 88.8 mmHg — ABNORMAL HIGH (ref 32.0–45.0)

## 2018-06-05 LAB — CBC
HCT: 41.3 % (ref 39.0–52.0)
Hemoglobin: 13.9 g/dL (ref 13.0–17.0)
MCH: 33.4 pg (ref 26.0–34.0)
MCHC: 33.7 g/dL (ref 30.0–36.0)
MCV: 99.3 fL (ref 80.0–100.0)
Platelets: 260 10*3/uL (ref 150–400)
RBC: 4.16 MIL/uL — ABNORMAL LOW (ref 4.22–5.81)
RDW: 13 % (ref 11.5–15.5)
WBC: 8.7 10*3/uL (ref 4.0–10.5)
nRBC: 0 % (ref 0.0–0.2)

## 2018-06-05 LAB — TROPONIN I
Troponin I: <0.03 ng/mL (ref <0.03)
Troponin I: 0.03 ng/mL (ref <0.03)

## 2018-06-05 LAB — BASIC METABOLIC PANEL
Anion gap: 8 (ref 5–15)
BUN: 20 mg/dL (ref 6–20)
CO2: 22 mmol/L (ref 22–32)
Calcium: 8.9 mg/dL (ref 8.9–10.3)
Chloride: 109 mmol/L (ref 98–111)
Creatinine, Ser: 0.8 mg/dL (ref 0.61–1.24)
GFR calc Af Amer: >60 mL/min (ref >60)
GFR calc non Af Amer: >60 mL/min (ref >60)
Glucose, Bld: 119 mg/dL — ABNORMAL HIGH (ref 70–99)
Potassium: 3.7 mmol/L (ref 3.5–5.1)
Sodium: 139 mmol/L (ref 135–145)

## 2018-06-05 LAB — D-DIMER, QUANTITATIVE (NOT AT ARMC): D-Dimer, Quant: 2.05 mcg/mL FEU — ABNORMAL HIGH (ref <0.50)

## 2018-06-05 LAB — BRAIN NATRIURETIC PEPTIDE: Pro B Natriuretic peptide (BNP): 544 pg/mL — ABNORMAL HIGH (ref 0.0–100.0)

## 2018-06-05 MED ORDER — IOHEXOL 350 MG/ML SOLN
100.0000 mL | Freq: Once | INTRAVENOUS | Status: AC | PRN
  Administered 2018-06-05: 17:00:00 100 mL via INTRAVENOUS

## 2018-06-05 MED ORDER — ENOXAPARIN SODIUM 40 MG/0.4ML ~~LOC~~ SOLN
40.0000 mg | Freq: Every day | SUBCUTANEOUS | Status: DC
  Administered 2018-06-05 – 2018-06-08 (×4): 40 mg via SUBCUTANEOUS
  Filled 2018-06-05 (×4): qty 0.4

## 2018-06-05 MED ORDER — ALBUTEROL SULFATE (2.5 MG/3ML) 0.083% IN NEBU: 2.5000 mg | INHALATION_SOLUTION | Freq: Four times a day (QID) | RESPIRATORY_TRACT | Status: DC | PRN

## 2018-06-05 MED ORDER — PANTOPRAZOLE SODIUM 40 MG PO TBEC
40.0000 mg | DELAYED_RELEASE_TABLET | Freq: Every day | ORAL | Status: DC
  Administered 2018-06-06 – 2018-06-09 (×4): 40 mg via ORAL
  Filled 2018-06-05 (×4): qty 1

## 2018-06-05 MED ORDER — ACETAMINOPHEN 325 MG PO TABS
650.0000 mg | ORAL_TABLET | Freq: Four times a day (QID) | ORAL | Status: DC | PRN
  Administered 2018-06-05 – 2018-06-07 (×3): 650 mg via ORAL
  Filled 2018-06-05 (×3): qty 2

## 2018-06-05 MED ORDER — LORAZEPAM 2 MG/ML IJ SOLN
0.5000 mg | Freq: Once | INTRAMUSCULAR | Status: AC
  Administered 2018-06-05: 0.5 mg via INTRAVENOUS
  Filled 2018-06-05: qty 1

## 2018-06-05 MED ORDER — SODIUM CHLORIDE (PF) 0.9 % IJ SOLN
INTRAMUSCULAR | Status: AC
  Filled 2018-06-05: qty 100

## 2018-06-05 MED ORDER — FUROSEMIDE 10 MG/ML IJ SOLN
40.0000 mg | Freq: Two times a day (BID) | INTRAMUSCULAR | Status: DC
  Administered 2018-06-06 – 2018-06-08 (×5): 40 mg via INTRAVENOUS
  Filled 2018-06-05 (×5): qty 4

## 2018-06-05 MED ORDER — FUROSEMIDE 10 MG/ML IJ SOLN
20.0000 mg | Freq: Once | INTRAMUSCULAR | Status: AC
  Administered 2018-06-05: 20 mg via INTRAVENOUS
  Filled 2018-06-05: qty 4

## 2018-06-05 MED ORDER — CLONAZEPAM 0.5 MG PO TABS
0.2500 mg | ORAL_TABLET | Freq: Every day | ORAL | Status: DC | PRN
  Administered 2018-06-05: 0.5 mg via ORAL
  Filled 2018-06-05: qty 1

## 2018-06-05 NOTE — ED Notes (Signed)
ED TO INPATIENT HANDOFF REPORT  ED Nurse Name and Phone #: Almond Lint Name/Age/Gender Austin [name] 53 y.o. male Room/Bed: WA04/WA04  Code Status   Code Status: Prior  Home/SNF/Other Given to floor Patient oriented to: self, place, time and situation Is this baseline? Yes   Triage Complete: Triage complete  Chief Complaint possible pe  Triage Note Patient complains of shortness of breath which started a month ago and has worsened since. It is increasingly worse when laying down to sleep. Family has noticed more labored breathing from the patient while sleeping.     Allergies No Known Allergies  Level of Care/Admitting Diagnosis ED Disposition    ED Disposition Condition Comment   Admit  Hospital Area: [facility] [[id number]]  Level of Care: Stepdown [14]  Admit to SDU based on following criteria: Respiratory Distress:  Frequent assessment and/or intervention to maintain adequate ventilation/respiration, pulmonary toilet, and respiratory treatment.  Covid Evaluation: N/A  Diagnosis: Acute respiratory failure with hypoxia [facility]) [[id number]]  Admitting Physician: John Giovanni [[id number]]  Attending Physician: John Giovanni [[id number]]  PT Class (Do Not Modify): Observation [104]  PT Acc Code (Do Not Modify): Observation [10022]       B Medical/Surgery History Past Medical History:  Diagnosis Date  . Anxiety   . Gastric ulcer   . GERD (gastroesophageal reflux disease)   . Guillain Barr syndrome (HCC) 1984   COMPLETE RECOVERY; triggered from flu illness  . Headache(784.0)    hx of tension-none recent  . Hematemesis 09/07/2016  . History of kidney stones 09/19/12   left ureteral stone-SURGERY 8/6 URETEROSCOPY / STENT PLACEMENT   . Partial gastric outlet obstruction   . UGIB (upper gastrointestinal bleed) 09/07/2016   Past Surgical History:  Procedure Laterality Date  . BALLOON DILATION N/A 11/15/2016   Procedure: BALLOON DILATION;   Surgeon: Benancio Deeds, MD;  Location: [facility];  Service: Gastroenterology;  Laterality: N/A;  . CYSTO, LEFT RGP, DIAGNOSTIC URETEROSCOPY AND STENT PLACEMENT  09/24/2012  . CYSTOSCOPY WITH RETROGRADE PYELOGRAM, URETEROSCOPY AND STENT PLACEMENT Left 09/24/2012   Procedure: CYSTOSCOPY WITH RETROGRADE PYELOGRAM, DIAGNOSTIC URETEROSCOPY AND STENT PLACEMENT;  Surgeon: Sebastian Ache, MD;  Location: [facility];  Service: Urology;  Laterality: Left;  . CYSTOSCOPY WITH RETROGRADE PYELOGRAM, URETEROSCOPY AND STENT PLACEMENT Left 10/08/2012   Procedure: CYSTOSCOPY WITH RETROGRADE PYELOGRAM, URETEROSCOPY AND STENT PLACEMENT;  Surgeon: Sebastian Ache, MD;  Location: [facility];  Service: Urology;  Laterality: Left;  1 HR NEEDS DIGITAL URETEROSCOPE   . ESOPHAGOGASTRODUODENOSCOPY N/A 09/08/2016   Procedure: ESOPHAGOGASTRODUODENOSCOPY (EGD);  Surgeon: Ruffin Frederick, MD;  Location: [facility];  Service: Gastroenterology;  Laterality: N/A;  . ESOPHAGOGASTRODUODENOSCOPY N/A 11/15/2016   Procedure: ESOPHAGOGASTRODUODENOSCOPY (EGD);  Surgeon: Benancio Deeds, MD;  Location: [facility];  Service: Gastroenterology;  Laterality: N/A;  . ESOPHAGOGASTRODUODENOSCOPY N/A 12/31/2016   Procedure: ESOPHAGOGASTRODUODENOSCOPY (EGD);  Surgeon: Benancio Deeds, MD;  Location: [facility];  Service: Gastroenterology;  Laterality: N/A;  . ESOPHAGOGASTRODUODENOSCOPY (EGD) WITH PROPOFOL N/A 10/15/2016   Procedure: ESOPHAGOGASTRODUODENOSCOPY (EGD) WITH PROPOFOL; Duodenal dilation;  Surgeon: Ruffin Frederick, MD;  Location: [facility] END[facility]COPY;  Service: Gastroenterology;  Laterality: N/A;  . HOLMIUM LASER APPLICATION Left 10/08/2012   Procedure: HOLMIUM LASER APPLICATION;  Surgeon: Sebastian Ache, MD;  Location: [facility];  Service: Urology;  Laterality: Left;  . SAVORY DILATION N/A 12/31/2016   Procedure: POSSIBLE SAVORY DILATION;  Surgeon: Benancio Deeds, MD;  Location: [facility];  Service:  Gastroenterology;  Laterality: N/A;  . TONSILLECTOMY  t&a as a child  . wisdom teeth extracted       A IV Location/Drains/Wounds Patient Lines/Drains/Airways Status   Active Line/Drains/Airways    Name:   Placement date:   Placement time:   Site:   Days:   Peripheral IV 06/05/18 Right Forearm   06/05/18    1626    Forearm   less than 1   Ureteral Drain/Stent Left ureter 6 Fr.   10/08/12    1236    Left ureter   2066   AIRWAYS   12/31/16    0644     521   AIRWAYS   12/31/16    0644     521   Incision 09/24/12 N/A Other (Comment)   09/24/12    0842     2080          Intake/Output Last 24 hours No intake or output data in the 24 hours ending 06/05/18 2212  Labs/Imaging Results for orders placed or performed during the hospital encounter of 06/05/18 (from the past 48 hour(s))  CBC     Status: Abnormal   Collection Time: 06/05/18  4:31 PM  Result Value Ref Range   WBC 8.7 4.0 - 10.5 K/uL   RBC 4.16 (L) 4.22 - 5.81 MIL/uL   Hemoglobin 13.9 13.0 - 17.0 g/dL   HCT 78.2 95.6 - 21.3 %   MCV 99.3 80.0 - 100.0 fL   MCH 33.4 26.0 - 34.0 pg   MCHC 33.7 30.0 - 36.0 g/dL   RDW 08.6 57.8 - 46.9 %   Platelets 260 150 - 400 K/uL   nRBC 0.0 0.0 - 0.2 %    Comment: Performed at [facility], [address], [place], [place] [address]  Basic metabolic panel     Status: Abnormal   Collection Time: 06/05/18  4:31 PM  Result Value Ref Range   Sodium 139 135 - 145 mmol/L   Potassium 3.7 3.5 - 5.1 mmol/L   Chloride 109 98 - 111 mmol/L   CO2 22 22 - 32 mmol/L   Glucose, Bld 119 (H) 70 - 99 mg/dL   BUN 20 6 - 20 mg/dL   Creatinine, Ser 8.41 0.61 - 1.24 mg/dL   Calcium 8.9 8.9 - 32.4 mg/dL   GFR calc non Af Amer >60 >60 mL/min   GFR calc Af Amer >60 >60 mL/min   Anion gap 8 5 - 15    Comment: Performed at [facility], [address], [place], [place] [address]  Troponin I - ONCE - STAT     Status: None   Collection Time: 06/05/18  6:26 PM  Result  Value Ref Range   Troponin I <0.03 <0.03 ng/mL    Comment: Performed at [facility], [address] [place], [place] [address]  Blood gas, venous (at [facility] and [facility])     Status: Abnormal   Collection Time: 06/05/18  6:30 PM  Result Value Ref Range   FIO2 100.00    pH, Ven 7.378 7.250 - 7.430   pCO2, Ven 43.1 (L) 44.0 - 60.0 mmHg   pO2, Ven 88.8 (H) 32.0 - 45.0 mmHg   Bicarbonate 24.8 20.0 - 28.0 mmol/L   Acid-Base Excess 0.0 0.0 - 2.0 mmol/L   O2 Saturation 95.2 %   Patient temperature 98.6    Collection site DRAWN BY RN    Drawn by [id number]    Sample type VENOUS     Comment: Performed  at [facility], [address], [place], [place] [address]   Dg Chest 2 View  Result Date: 06/05/2018 CLINICAL DATA:  Right lung opacity EXAM: CHEST - 2 VIEW COMPARISON:  06/02/2018 FINDINGS: Stable right infrahilar opacity. Small bilateral pleural effusions. The heart is normal in size. Kerley B-lines are present at both lung bases laterally. No pneumothorax. Vascularity is within normal limits. IMPRESSION: Stable right infrahilar airspace opacity. Stable bilateral interstitial infiltrates characterized by Charyl Dancer B lines. Continued follow-up until complete resolution is recommended. Electronically Signed   By: Jolaine Click M.D.   On: 06/05/2018 16:14   Ct Angio Chest Pe W/cm &/or Wo Cm  Result Date: 06/05/2018 CLINICAL DATA:  Shortness of breath EXAM: CT ANGIOGRAPHY CHEST WITH CONTRAST TECHNIQUE: Multidetector CT imaging of the chest was performed using the standard protocol during bolus administration of intravenous contrast. Multiplanar CT image reconstructions and MIPs were obtained to evaluate the vascular anatomy. CONTRAST:  OMNIPAQUE IOHEXOL 350 MG/ML SOLN COMPARISON:  Chest radiograph June 05, 2018 FINDINGS: Cardiovascular: There Is no demonstrable pulmonary embolus. There is no thoracic aortic aneurysm. No dissection is appreciable. Note that the contrast bolus  in the aorta is not sufficient for dissection assessment. Visualized great vessels appear unremarkable. There is no pericardial effusion or pericardial thickening. Heart is mildly enlarged. There is felt to be a degree of pulmonary venous hypertension. Mediastinum/Nodes: Thyroid appears unremarkable. There are scattered subcentimeter mediastinal lymph nodes. There is no appreciable thoracic adenopathy by size criteria. A small amount of pericardial fluid is noted in the subcarinal region, likely physiologic. No esophageal lesions are evident. Lungs/Pleura: There are free-flowing pleural effusions bilaterally, larger on the right than on the left. There is a degree of underlying centrilobular emphysema. There is atelectatic change in the lung bases. There is interstitial edema bilaterally. There is no frank airspace consolidation. Upper Abdomen: There is reflux of contrast into the inferior vena cava and hepatic veins. Visualized upper abdominal structures otherwise appear unremarkable except for mild abdominal aortic atherosclerosis. Musculoskeletal: There are no blastic or lytic bone lesions. No evident chest wall lesions. Review of the MIP images confirms the above findings. IMPRESSION: 1. No demonstrable pulmonary embolus. No thoracic aortic aneurysm. No dissection evident; the contrast bolus in the aorta is not sufficient for dissection assessment. 2. Heart mildly enlarged with probable degree of pulmonary venous hypertension. Pleural effusions bilaterally, larger on the right than on the left. There is evident interstitial edema. Question a degree of underlying congestive heart failure. 3. Centrilobular emphysematous change. Areas of atelectatic change. No frank airspace consolidation. 4.  No appreciable adenopathy. 5. Reflux of contrast into the inferior vena cava and hepatic veins, a finding most likely indicative of a degree of increased right heart pressure. Emphysema (ICD10-J43.9). Electronically Signed    By: Bretta Bang III M.D.   On: 06/05/2018 17:39    Pending Labs Unresulted Labs (From admission, onward)    Start     Ordered   06/05/18 2014  Troponin I - Now Then Q6H  Now then every 6 hours,   R     06/05/18 2013          Vitals/Pain Today's Vitals   06/05/18 2129 06/05/18 2130 06/05/18 2131 06/05/18 2200  BP:  (!) 121/92  (!) 119/95  Pulse: (!) 103 (!) 115 (!) 102 (!) 104  Resp:      Temp:      TempSrc:      SpO2: 100% (!) 89% 94% 96%  Weight:      Height:      PainSc:        Isolation Precautions No active isolations  Medications Medications  sodium chloride (PF) 0.9 % injection (has no administration in time range)  furosemide (LASIX) injection 40 mg (has no administration in time range)  acetaminophen (TYLENOL) tablet 650 mg (has no administration in time range)  omeprazole (PRILOSEC OTC) EC tablet 20 mg (has no administration in time range)  clonazePAM (KLONOPIN) tablet 0.25-0.5 mg (has no administration in time range)  enoxaparin (LOVENOX) injection 40 mg (has no administration in time range)  albuterol (PROVENTIL) (2.5 MG/3ML) 0.083% nebulizer solution 2.5 mg (has no administration in time range)  iohexol (OMNIPAQUE) 350 MG/ML injection 100 mL (100 mLs Intravenous Contrast Given 06/05/18 1720)  furosemide (LASIX) injection 20 mg (20 mg Intravenous Given 06/05/18 1757)  furosemide (LASIX) injection 20 mg (20 mg Intravenous Given 06/05/18 1822)  LORazepam (ATIVAN) injection 0.5 mg (0.5 mg Intravenous Given 06/05/18 1822)    Mobility walks with person assist Low fall risk   Focused Assessments Cardiac Assessment Handoff:    Lab Results  Component Value Date   TROPONINI <0.03 06/05/2018   Lab Results  Component Value Date   DDIMER 2.05 (H) 06/05/2018   Does the Patient currently have chest pain? No  , Pulmonary Assessment Handoff:  Lung sounds:   O2 Device: Nasal Cannula O2 Flow Rate (L/min): 3 L/min      R Recommendations: See  Admitting Provider Note  Report given to:   Additional Notes:

## 2018-06-05 NOTE — ED Notes (Signed)
Receiving RN in an emergency situation. When she is able she will call back for report.

## 2018-06-05 NOTE — Telephone Encounter (Signed)
Spoke to patient and informed him of his D dimer and BNP. Both positive. Will send to [facility] ER for r/o of PE

## 2018-06-05 NOTE — Progress Notes (Signed)
Subjective:    Patient ID: Austin MurdochWilliam Bryan [name], male    DOB: 05/20/1965, 53 y.o.   MRN: [id number]  HPI  53 year old male who  has a past medical history of Anxiety, Gastric ulcer, GERD (gastroesophageal reflux disease), Guillain Barr syndrome (HCC) (1984), Headache(784.0), Hematemesis (09/07/2016), History of kidney stones (09/19/12), Partial gastric outlet obstruction, and UGIB (upper gastrointestinal bleed) (09/07/2016).  He is being seen in the office today at the request of his PCP who originally had of video visit with 3 days ago.  At this time he reported shortness of breath that started a few months ago when he had an episode where his breathing was very shallow and he could not catch his breath.  This felt like a panic attack.  This is of the shortness of breath or being it not been able to catch his breath has become worse over the last few weeks.    This of breath is worse at night and is happening intermittently, happening 3 times this week and lasting about an hour each time.  Denies any tightness in his chest or palpitations the best way he can describe it is "my stomach feels full"  Prescribed Klonopin which he has been using took a half a tab last night before bed and woke up feeling shortness of breath this morning took another half a tab around 5:30 AM, he reports that he continues to feel mildly short of breath at this time.  The pain is helping slightly.  Denies any nausea, vomiting, or diarrhea.  He has not experienced any fevers or chills.  He does have the shortness of breath at night he will sometimes have a dry cough and or feel wheezy.  He quit smoking about a month ago because the shortness of breath was scaring him.  Smoked for roughly 30 years.  Per patient "feels as though some of this could be from anxiety related to job stress, but I am not sure if all that is related to anxiety ".  Review of Systems See HPI   Past Medical History:  Diagnosis Date  .  Anxiety   . Gastric ulcer   . GERD (gastroesophageal reflux disease)   . Guillain Barr syndrome (HCC) 1984   COMPLETE RECOVERY; triggered from flu illness  . Headache(784.0)    hx of tension-none recent  . Hematemesis 09/07/2016  . History of kidney stones 09/19/12   left ureteral stone-SURGERY 8/6 URETEROSCOPY / STENT PLACEMENT   . Partial gastric outlet obstruction   . UGIB (upper gastrointestinal bleed) 09/07/2016    Social History   Socioeconomic History  . Marital status: Married    Spouse name: Not on file  . Number of children: Not on file  . Years of education: Not on file  . Highest education level: Not on file  Occupational History  . Not on file  Social Needs  . Financial resource strain: Not on file  . Food insecurity:    Worry: Not on file    Inability: Not on file  . Transportation needs:    Medical: Not on file    Non-medical: Not on file  Tobacco Use  . Smoking status: Former Smoker    Packs/day: 1.50    Years: 28.00    Pack years: 42.00    Types: Cigarettes    Last attempt to quit: 05/02/2018    Years since quitting: 0.0  . Smokeless tobacco: Never Used  Substance and Sexual Activity  . Alcohol  use: Yes    Comment: occasional  . Drug use: No    Types: Marijuana    Comment: in the past  . Sexual activity: Not on file  Lifestyle  . Physical activity:    Days per week: Not on file    Minutes per session: Not on file  . Stress: Not on file  Relationships  . Social connections:    Talks on phone: Not on file    Gets together: Not on file    Attends religious service: Not on file    Active member of club or organization: Not on file    Attends meetings of clubs or organizations: Not on file    Relationship status: Not on file  . Intimate partner violence:    Fear of current or ex partner: Not on file    Emotionally abused: Not on file    Physically abused: Not on file    Forced sexual activity: Not on file  Other Topics Concern  . Not on file   Social History Narrative  . Not on file    Past Surgical History:  Procedure Laterality Date  . BALLOON DILATION N/A 11/15/2016   Procedure: BALLOON DILATION;  Surgeon: Benancio Deeds, MD;  Location: [facility];  Service: Gastroenterology;  Laterality: N/A;  . CYSTO, LEFT RGP, DIAGNOSTIC URETEROSCOPY AND STENT PLACEMENT  09/24/2012  . CYSTOSCOPY WITH RETROGRADE PYELOGRAM, URETEROSCOPY AND STENT PLACEMENT Left 09/24/2012   Procedure: CYSTOSCOPY WITH RETROGRADE PYELOGRAM, DIAGNOSTIC URETEROSCOPY AND STENT PLACEMENT;  Surgeon: Sebastian Ache, MD;  Location: [facility];  Service: Urology;  Laterality: Left;  . CYSTOSCOPY WITH RETROGRADE PYELOGRAM, URETEROSCOPY AND STENT PLACEMENT Left 10/08/2012   Procedure: CYSTOSCOPY WITH RETROGRADE PYELOGRAM, URETEROSCOPY AND STENT PLACEMENT;  Surgeon: Sebastian Ache, MD;  Location: [facility];  Service: Urology;  Laterality: Left;  1 HR NEEDS DIGITAL URETEROSCOPE   . ESOPHAGOGASTRODUODENOSCOPY N/A 09/08/2016   Procedure: ESOPHAGOGASTRODUODENOSCOPY (EGD);  Surgeon: Ruffin Frederick, MD;  Location: [facility];  Service: Gastroenterology;  Laterality: N/A;  . ESOPHAGOGASTRODUODENOSCOPY N/A 11/15/2016   Procedure: ESOPHAGOGASTRODUODENOSCOPY (EGD);  Surgeon: Benancio Deeds, MD;  Location: [facility];  Service: Gastroenterology;  Laterality: N/A;  . ESOPHAGOGASTRODUODENOSCOPY N/A 12/31/2016   Procedure: ESOPHAGOGASTRODUODENOSCOPY (EGD);  Surgeon: Benancio Deeds, MD;  Location: [facility];  Service: Gastroenterology;  Laterality: N/A;  . ESOPHAGOGASTRODUODENOSCOPY (EGD) WITH PROPOFOL N/A 10/15/2016   Procedure: ESOPHAGOGASTRODUODENOSCOPY (EGD) WITH PROPOFOL; Duodenal dilation;  Surgeon: Ruffin Frederick, MD;  Location: [facility] END[facility];  Service: Gastroenterology;  Laterality: N/A;  . HOLMIUM LASER APPLICATION Left 10/08/2012   Procedure: HOLMIUM LASER APPLICATION;  Surgeon: Sebastian Ache, MD;  Location: [facility];  Service: Urology;  Laterality:  Left;  . SAVORY DILATION N/A 12/31/2016   Procedure: POSSIBLE SAVORY DILATION;  Surgeon: Benancio Deeds, MD;  Location: [facility];  Service: Gastroenterology;  Laterality: N/A;  . TONSILLECTOMY     t&a as a child  . wisdom teeth extracted      Family History  Problem Relation Age of Onset  . Heart disease Mother   . Congestive Heart Failure Mother   . Ulcerative colitis Father   . Hemachromatosis Father   . Rheum arthritis Father   . Heart disease Brother 48       stenting    No Known Allergies  Current Outpatient Medications on File Prior to Visit  Medication Sig Dispense Refill  . acetaminophen (TYLENOL) 500 MG tablet Take 1,000 mg 2 (two) times daily as needed by mouth for moderate pain.    Marland Kitchen  clonazePAM (KLONOPIN) 0.5 MG tablet Take 0.5-1 tablets (0.25-0.5 mg total) by mouth daily as needed for anxiety. 15 tablet 0  . pantoprazole (PROTONIX) 40 MG tablet Take 1 tablet (40 mg total) by mouth 2 (two) times daily. Call our office for further refills. 60 tablet 3   No current facility-administered medications on file prior to visit.     BP 110/60 (BP Location: Left Arm, Patient Position: Sitting, Cuff Size: Normal)   Pulse (!) 108   Wt 180 lb 9.6 oz (81.9 kg)   SpO2 99%   BMI 24.49 kg/m       Objective:   Physical Exam Vitals signs and nursing note reviewed.  Constitutional:      Appearance: Normal appearance.  HENT:     Mouth/Throat:     Mouth: Mucous membranes are moist.     Pharynx: Oropharynx is clear. No oropharyngeal exudate or posterior oropharyngeal erythema.  Cardiovascular:     Rate and Rhythm: Normal rate and regular rhythm.     Pulses: Normal pulses.     Heart sounds: Normal heart sounds.  Pulmonary:     Effort: Tachypnea (mild) present. No accessory muscle usage or respiratory distress.     Breath sounds: Normal breath sounds. No wheezing, rhonchi or rales.  Chest:     Chest wall: No tenderness.  Abdominal:     General: Abdomen is flat.  Bowel sounds are normal.     Palpations: Abdomen is soft.  Skin:    General: Skin is warm and dry.     Capillary Refill: Capillary refill takes less than 2 seconds.     Coloration: Skin is not cyanotic or pale.  Neurological:     General: No focal deficit present.     Mental Status: He is alert and oriented to person, place, and time.  Psychiatric:        Mood and Affect: Mood normal.        Behavior: Behavior normal.        Thought Content: Thought content normal.        Judgment: Judgment normal.       Assessment & Plan:  1. SOB (shortness of breath) - Possibly anxiety related? Low suspicion for PE at this point  - EKG 12-Lead -EKG showed sinus rhythm with left atrial enlargement.  Possible left ventricular hypertrophy.  Nonspecific ST depression plus diffuse nonspecific T abnormalities which is seen with left ventricular hypertrophy(strain), rate 99. - DG Chest 2 View; Future - D-dimer, Quantitative - Brain Natriuretic Peptide -Consider CT scan   Shirline Frees, NP

## 2018-06-05 NOTE — Progress Notes (Signed)
CRITICAL VALUE ALERT  Critical Value:  Troponin 0.03  Date & Time Notied:  06/05/2018  Provider Notified: Bodenheimer  Orders Received/Actions taken: No new orders

## 2018-06-05 NOTE — Telephone Encounter (Signed)
Called and spoke with patient. Appointment has been scheduled with [facility].

## 2018-06-05 NOTE — Addendum Note (Signed)
Addended by: Bonnye Fava on: 06/05/2018 12:06 PM   Modules accepted: Orders

## 2018-06-05 NOTE — H&P (Signed)
History and Physical    Austin [name] NOT:[id number] DOB: 10-29-1965 DOA: 06/05/2018  PCP: Wynn Banker, MD Patient coming from: Home shortness of breath  Chief Complaint:   HPI: Austin [name] is a 53 y.o. 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  Patient was seen by his PCP and found to have an elevated d-dimer of 2.05 and elevated BNP of 544.  He was sent to the ER for further evaluation.  Patient states he has been feeling short of breath since October 2019 and symptoms have been getting progressively worse, especially for the past 1 month.  He feels short of breath both with exertion and at rest.  Sometimes his shortness of breath is accompanied by wheezing.  Symptoms are especially worse at night when he lays down flat to sleep.  He is also having a dry cough.  No weight gain or lower extremity edema.  Denies any history of CHF or lung disease. States he was previously smoking cigarettes but quit a month ago.  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  His last trip was the end of February to [place].  He has stayed at home for the past 1 month.  Denies any fevers, chills, sick contacts, or exposure to any individual with confirmed COVID-19.  Review of Systems: As per HPI otherwise 10 point review of systems negative.  Past Medical History:  Diagnosis Date  . Anxiety   . Gastric ulcer   . GERD (gastroesophageal reflux disease)   . Guillain Barr syndrome (HCC) 1984   COMPLETE RECOVERY; triggered from flu illness  . Headache(784.0)    hx of tension-none recent  . Hematemesis 09/07/2016  . History of kidney stones 09/19/12   left ureteral stone-SURGERY 8/6 URETEROSCOPY / STENT PLACEMENT   . Partial gastric outlet obstruction   . UGIB (upper gastrointestinal bleed)  09/07/2016    Past Surgical History:  Procedure Laterality Date  . BALLOON DILATION N/A 11/15/2016   Procedure: BALLOON DILATION;  Surgeon: Benancio Deeds, MD;  Location: [facility];  Service: Gastroenterology;  Laterality: N/A;  . CYSTO, LEFT RGP, DIAGNOSTIC URETEROSCOPY AND STENT PLACEMENT  09/24/2012  . CYSTOSCOPY WITH RETROGRADE PYELOGRAM, URETEROSCOPY AND STENT PLACEMENT Left 09/24/2012   Procedure: CYSTOSCOPY WITH RETROGRADE PYELOGRAM, DIAGNOSTIC URETEROSCOPY AND STENT PLACEMENT;  Surgeon: Sebastian Ache, MD;  Location: [facility];  Service: Urology;  Laterality: Left;  . CYSTOSCOPY WITH RETROGRADE PYELOGRAM, URETEROSCOPY AND STENT PLACEMENT Left 10/08/2012   Procedure: CYSTOSCOPY WITH RETROGRADE PYELOGRAM, URETEROSCOPY AND STENT PLACEMENT;  Surgeon: Sebastian Ache, MD;  Location: [facility];  Service: Urology;  Laterality: Left;  1 HR NEEDS DIGITAL URETEROSCOPE   . ESOPHAGOGASTRODUODENOSCOPY N/A 09/08/2016   Procedure: ESOPHAGOGASTRODUODENOSCOPY (EGD);  Surgeon: Ruffin Frederick, MD;  Location: [facility];  Service: Gastroenterology;  Laterality: N/A;  . ESOPHAGOGASTRODUODENOSCOPY N/A 11/15/2016   Procedure: ESOPHAGOGASTRODUODENOSCOPY (EGD);  Surgeon: Benancio Deeds, MD;  Location: [facility];  Service: Gastroenterology;  Laterality: N/A;  . ESOPHAGOGASTRODUODENOSCOPY N/A 12/31/2016   Procedure: ESOPHAGOGASTRODUODENOSCOPY (EGD);  Surgeon: Benancio Deeds, MD;  Location: [facility];  Service: Gastroenterology;  Laterality: N/A;  . ESOPHAGOGASTRODUODENOSCOPY (EGD) WITH PROPOFOL N/A 10/15/2016   Procedure: ESOPHAGOGASTRODUODENOSCOPY (EGD) WITH PROPOFOL; Duodenal dilation;  Surgeon: Ruffin Frederick, MD;  Location: [facility];  Service: Gastroenterology;  Laterality: N/A;  . HOLMIUM LASER APPLICATION Left  10/08/2012   Procedure: HOLMIUM LASER APPLICATION;  Surgeon: Sebastian Ache, MD;  Location: [facility];  Service: Urology;  Laterality: Left;  . SAVORY DILATION N/A  12/31/2016   Procedure: POSSIBLE SAVORY DILATION;  Surgeon: Benancio Deeds, MD;  Location: [facility];  Service: Gastroenterology;  Laterality: N/A;  . TONSILLECTOMY     t&a as a child  . wisdom teeth extracted       reports that he quit smoking about 4 weeks ago. His smoking use included cigarettes. He has a 42.00 pack-year smoking history. He has never used smokeless tobacco. He reports current alcohol use. He reports that he does not use drugs.  No Known Allergies  Family History  Problem Relation Age of Onset  . Heart disease Mother   . Congestive Heart Failure Mother   . Ulcerative colitis Father   . Hemachromatosis Father   . Rheum arthritis Father   . Heart disease Brother 48       stenting    Prior to Admission medications   Medication Sig Start Date End Date Taking? Authorizing Provider  acetaminophen (TYLENOL) 500 MG tablet Take 1,000 mg 2 (two) times daily as needed by mouth for moderate pain.   Yes [provider]  clonazePAM (KLONOPIN) 0.5 MG tablet Take 0.5-1 tablets (0.25-0.5 mg total) by mouth daily as needed for anxiety. 06/02/18  Yes Koberlein, Junell C, MD  MELATONIN PO Take 1 tablet by mouth at bedtime as needed (sleep).   Yes [provider]  omeprazole (PRILOSEC OTC) 20 MG tablet Take 20 mg by mouth daily.   Yes [provider]  pantoprazole (PROTONIX) 40 MG tablet Take 1 tablet (40 mg total) by mouth 2 (two) times daily. Call our office for further refills. Patient not taking: Reported on 06/05/2018 11/04/17   Sammuel Cooper, PA-C    Physical Exam: Vitals:   06/05/18 1830 06/05/18 1845 06/05/18 1900 06/05/18 1930  BP: (!) 141/112  (!) 125/101 (!) 121/109  Pulse: (!) 124 (!) 116 (!) 108 (!) 36  Resp: (!) 42 (!) 27 (!) 21   Temp:      TempSrc:      SpO2: 93% 96% 97% 96%  Weight:      Height:        Physical Exam  Constitutional: He is oriented to person, place, and time. He appears well-developed and well-nourished.  No distress.  HENT:  Head: Normocephalic.  Eyes: Right eye exhibits no discharge. Left eye exhibits no discharge.  Neck: Neck supple. No JVD present.  Cardiovascular: Normal rate, regular rhythm and intact distal pulses.  Pulmonary/Chest: Effort normal. He has no wheezes. He has rales.  Bibasilar rales  Abdominal: Soft. Bowel sounds are normal. He exhibits no distension. There is no abdominal tenderness. There is no guarding.  Musculoskeletal:        General: No edema.     Comments: No lower extremity erythema, edema, increased warmth, or tenderness to palpation.  Neurological: He is alert and oriented to person, place, and time.  Skin: Skin is warm and dry. He is not diaphoretic.     Labs on Admission: I have personally reviewed following labs and imaging studies  CBC: Recent Labs  Lab 06/02/18 1329 06/05/18 1631  WBC 8.7 8.7  NEUTROABS 6.2  --   HGB 14.5 13.9  HCT 42.2 41.3  MCV 98.3 99.3  PLT 255.0 260   Basic Metabolic Panel: Recent Labs  Lab 06/02/18 1329 06/05/18 1631  NA 140 139  K 3.8 3.7  CL 105 109  CO2 25 22  GLUCOSE 87 119*  BUN 18 20  CREATININE 0.67 0.80  CALCIUM 9.0 8.9   GFR: Estimated Creatinine Clearance: 121.3 mL/min (by C-G formula based on SCr of 0.8 mg/dL). Liver Function Tests: Recent Labs  Lab 06/02/18 1329  AST 15  ALT 15  ALKPHOS 93  BILITOT 1.0  PROT 6.8  ALBUMIN 4.1   No results for input(s): LIPASE, AMYLASE in the last 168 hours. No results for input(s): AMMONIA in the last 168 hours. Coagulation Profile: No results for input(s): INR, PROTIME in the last 168 hours. Cardiac Enzymes: Recent Labs  Lab 06/05/18 1826  TROPONINI <0.03   BNP (last 3 results) Recent Labs    06/05/18 1159  PROBNP 544.0*   HbA1C: No results for input(s): HGBA1C in the last 72 hours. CBG: No results for input(s): GLUCAP in the last 168 hours. Lipid Profile: No results for input(s): CHOL, HDL, LDLCALC, TRIG, CHOLHDL, LDLDIRECT in the last  72 hours. Thyroid Function Tests: No results for input(s): TSH, T4TOTAL, FREET4, T3FREE, THYROIDAB in the last 72 hours. Anemia Panel: No results for input(s): VITAMINB12, FOLATE, FERRITIN, TIBC, IRON, RETICCTPCT in the last 72 hours. Urine analysis:    Component Value Date/Time   COLORURINE YELLOW 07/04/2010 1515   APPEARANCEUR CLEAR 07/04/2010 1515   LABSPEC 1.031 (H) 07/04/2010 1515   PHURINE 5.5 07/04/2010 1515   GLUCOSEU NEGATIVE 07/04/2010 1515   HGBUR NEGATIVE 07/04/2010 1515   BILIRUBINUR neg 09/11/2012 2009   KETONESUR 40 (A) 07/04/2010 1515   PROTEINUR neg 09/11/2012 2009   PROTEINUR NEGATIVE 07/04/2010 1515   UROBILINOGEN 0.2 09/11/2012 2009   UROBILINOGEN 0.2 07/04/2010 1515   NITRITE neg 09/11/2012 2009   NITRITE NEGATIVE 07/04/2010 1515   LEUKOCYTESUR Negative 09/11/2012 2009    Radiological Exams on Admission: Dg Chest 2 View  Result Date: 06/05/2018 CLINICAL DATA:  Right lung opacity EXAM: CHEST - 2 VIEW COMPARISON:  06/02/2018 FINDINGS: Stable right infrahilar opacity. Small bilateral pleural effusions. The heart is normal in size. Kerley B-lines are present at both lung bases laterally. No pneumothorax. Vascularity is within normal limits. IMPRESSION: Stable right infrahilar airspace opacity. Stable bilateral interstitial infiltrates characterized by Charyl Dancer B lines. Continued follow-up until complete resolution is recommended. Electronically Signed   By: Jolaine Click M.D.   On: 06/05/2018 16:14   Ct Angio Chest Pe W/cm &/or Wo Cm  Result Date: 06/05/2018 CLINICAL DATA:  Shortness of breath EXAM: CT ANGIOGRAPHY CHEST WITH CONTRAST TECHNIQUE: Multidetector CT imaging of the chest was performed using the standard protocol during bolus administration of intravenous contrast. Multiplanar CT image reconstructions and MIPs were obtained to evaluate the vascular anatomy. CONTRAST:  OMNIPAQUE IOHEXOL 350 MG/ML SOLN COMPARISON:  Chest radiograph June 05, 2018 FINDINGS:  Cardiovascular: There Is no demonstrable pulmonary embolus. There is no thoracic aortic aneurysm. No dissection is appreciable. Note that the contrast bolus in the aorta is not sufficient for dissection assessment. Visualized great vessels appear unremarkable. There is no pericardial effusion or pericardial thickening. Heart is mildly enlarged. There is felt to be a degree of pulmonary venous hypertension. Mediastinum/Nodes: Thyroid appears unremarkable. There are scattered subcentimeter mediastinal lymph nodes. There is no appreciable thoracic adenopathy by size criteria. A small amount of pericardial fluid is noted in the subcarinal region, likely physiologic. No esophageal lesions are evident. Lungs/Pleura: There are free-flowing pleural effusions bilaterally, larger on the right than on the left. There is a degree of underlying  centrilobular emphysema. There is atelectatic change in the lung bases. There is interstitial edema bilaterally. There is no frank airspace consolidation. Upper Abdomen: There is reflux of contrast into the inferior vena cava and hepatic veins. Visualized upper abdominal structures otherwise appear unremarkable except for mild abdominal aortic atherosclerosis. Musculoskeletal: There are no blastic or lytic bone lesions. No evident chest wall lesions. Review of the MIP images confirms the above findings. IMPRESSION: 1. No demonstrable pulmonary embolus. No thoracic aortic aneurysm. No dissection evident; the contrast bolus in the aorta is not sufficient for dissection assessment. 2. Heart mildly enlarged with probable degree of pulmonary venous hypertension. Pleural effusions bilaterally, larger on the right than on the left. There is evident interstitial edema. Question a degree of underlying congestive heart failure. 3. Centrilobular emphysematous change. Areas of atelectatic change. No frank airspace consolidation. 4.  No appreciable adenopathy. 5. Reflux of contrast into the inferior  vena cava and hepatic veins, a finding most likely indicative of a degree of increased right heart pressure. Emphysema (ICD10-J43.9). Electronically Signed   By: Bretta Bang III M.D.   On: 06/05/2018 17:39    EKG: Independently reviewed.  Sinus tachycardia, T wave inversions in V5 and V6.  No prior EKG for comparison.  Assessment/Plan Principal Problem:   Acute respiratory failure with hypoxia (HCC) Active Problems:   Peptic ulcer of stomach   Abnormal EKG   Anxiety   Acute hypoxic respiratory failure secondary to suspected new onset congestive heart failure -Presenting with a 25-month history of progressively worsening dyspnea, cough, and orthopnea. -Tachycardic and tachypneic.  Not wheezing.  Oxygen saturation in the low-mid 80s in the ED. Continued to have increased work of breathing and tachypnia with nonrebreather and was placed on BiPAP. ABG showing pH 7.37, PCO2 43, PO2 88 (per respiratory, ABG was accidentally charted as VBG in epic).  -Afebrile and no leukocytosis.  proBNP 544.  D-dimer 2.05. -Chest x-ray showing stable right infrahilar airspace opacity.  Stable bilateral interstitial infiltrates characterized by curly B-lines. -CT angiogram negative for PE.  Evidence of cardiomegaly and bilateral pleural effusions, larger on the right than on the left.  There is evident interstitial edema.  Question a degree of underlying congestive heart failure.  Centrilobular emphysematous change in areas of atelectatic change.  No frank airspace consolidation. -Low suspicion for COVID-19 given no fever, exposure history, recent travel, or concerning CT findings.  In addition, the fact that the patient symptoms are ongoing for 6 months makes COVID-19 less likely. -Received IV Lasix 40 mg in the ED.  Continue IV Lasix 40 mg every 12 hours starting in the morning. -Echocardiogram -Monitor intake and output.  Daily weights.  Low-sodium diet with fluid restriction. -Continue Bipap at this time;  wean as tolerated -Check lower extremity Dopplers to rule out DVT.  T wave inversions on EKG EKG with T wave inversions in V5 and V6, no prior EKG for comparison.  Troponin negative.  Patient is not complaining of chest pain.  -Cardiac monitoring -Continue to trend troponin  Anxiety -Continue home Klonopin 0.25-0.5 mg daily as needed  PUD -Continue omeprazole  DVT prophylaxis: Lovenox Code Status: Full code Family Communication: No family available. Disposition Plan: Anticipate discharge after clinical improvement. Consults called: None Admission status: Observation, stepdown unit  This chart was dictated using voice recognition software.  Despite best efforts to proofread, errors can occur which can change the documentation meaning.  John Giovanni MD [facility] Pager [phone]  If 7PM-7AM, please contact night-coverage [email or web address]  Password [email or web address]  06/05/2018, 8:18 PM

## 2018-06-05 NOTE — ED Notes (Signed)
RN attempted to call report. ICU will call when they are able to accept report.

## 2018-06-05 NOTE — Progress Notes (Signed)
Pt currently off BIPAP and tolerating well at this time, pt currently on 3 LPM [place].  RT will hold BIPAP at this time, RT to monitor and assess as needed.

## 2018-06-05 NOTE — Progress Notes (Signed)
ED TO INPATIENT HANDOFF REPORT  Name/Age/Gender Austin [name] 53 y.o. male  Code Status Code Status History    Date Active Date Inactive Code Status Order ID Comments User Context   09/07/2016 1625 09/11/2016 1809 Full Code [id number]  Richarda Overlie, MD ED      Home/SNF/Other Home  Chief Complaint possible pe  Level of Care/Admitting Diagnosis ED Disposition    ED Disposition Condition Comment   Admit  Hospital Area: [facility] [[id number]]  Level of Care: Stepdown [14]  Admit to SDU based on following criteria: Respiratory Distress:  Frequent assessment and/or intervention to maintain adequate ventilation/respiration, pulmonary toilet, and respiratory treatment.  Covid Evaluation: N/A  Diagnosis: Acute respiratory failure with hypoxia [facility]) [[id number]]  Admitting Physician: John Giovanni [[id number]]  Attending Physician: John Giovanni [[id number]]  PT Class (Do Not Modify): Observation [104]  PT Acc Code (Do Not Modify): Observation [10022]       Medical History Past Medical History:  Diagnosis Date  . Anxiety   . Gastric ulcer   . GERD (gastroesophageal reflux disease)   . Guillain Barr syndrome (HCC) 1984   COMPLETE RECOVERY; triggered from flu illness  . Headache(784.0)    hx of tension-none recent  . Hematemesis 09/07/2016  . History of kidney stones 09/19/12   left ureteral stone-SURGERY 8/6 URETEROSCOPY / STENT PLACEMENT   . Partial gastric outlet obstruction   . UGIB (upper gastrointestinal bleed) 09/07/2016    Allergies No Known Allergies  IV Location/Drains/Wounds Patient Lines/Drains/Airways Status   Active Line/Drains/Airways    Name:   Placement date:   Placement time:   Site:   Days:   Peripheral IV 06/05/18 Right Forearm   06/05/18    1626    Forearm   less than 1   Ureteral Drain/Stent Left ureter 6 Fr.   10/08/12    1236    Left ureter   2066   AIRWAYS   12/31/16    0644     521   AIRWAYS   12/31/16    0644     521    Incision 09/24/12 N/A Other (Comment)   09/24/12    0842     2080          Labs/Imaging Results for orders placed or performed during the hospital encounter of 06/05/18 (from the past 48 hour(s))  CBC     Status: Abnormal   Collection Time: 06/05/18  4:31 PM  Result Value Ref Range   WBC 8.7 4.0 - 10.5 K/uL   RBC 4.16 (L) 4.22 - 5.81 MIL/uL   Hemoglobin 13.9 13.0 - 17.0 g/dL   HCT 78.6 76.7 - 20.9 %   MCV 99.3 80.0 - 100.0 fL   MCH 33.4 26.0 - 34.0 pg   MCHC 33.7 30.0 - 36.0 g/dL   RDW 47.0 96.2 - 83.6 %   Platelets 260 150 - 400 K/uL   nRBC 0.0 0.0 - 0.2 %    Comment: Performed at [facility], [address] [place], [place] [address]  Basic metabolic panel     Status: Abnormal   Collection Time: 06/05/18  4:31 PM  Result Value Ref Range   Sodium 139 135 - 145 mmol/L   Potassium 3.7 3.5 - 5.1 mmol/L   Chloride 109 98 - 111 mmol/L   CO2 22 22 - 32 mmol/L   Glucose, Bld 119 (H) 70 - 99 mg/dL   BUN 20 6 - 20 mg/dL   Creatinine,  Ser 0.80 0.61 - 1.24 mg/dL   Calcium 8.9 8.9 - 26.3 mg/dL   GFR calc non Af Amer >60 >60 mL/min   GFR calc Af Amer >60 >60 mL/min   Anion gap 8 5 - 15    Comment: Performed at [facility], [address] [place], [place] [address]  Troponin I - ONCE - STAT     Status: None   Collection Time: 06/05/18  6:26 PM  Result Value Ref Range   Troponin I <0.03 <0.03 ng/mL    Comment: Performed at [facility], [address] [place], [place] [address]  Blood gas, venous (at [facility] and [facility])     Status: Abnormal   Collection Time: 06/05/18  6:30 PM  Result Value Ref Range   FIO2 100.00    pH, Ven 7.378 7.250 - 7.430   pCO2, Ven 43.1 (L) 44.0 - 60.0 mmHg   pO2, Ven 88.8 (H) 32.0 - 45.0 mmHg   Bicarbonate 24.8 20.0 - 28.0 mmol/L   Acid-Base Excess 0.0 0.0 - 2.0 mmol/L   O2 Saturation 95.2 %   Patient temperature 98.6    Collection site DRAWN BY RN    Drawn by [phone]    Sample type VENOUS      Comment: Performed at [facility], [address] [place], [place] [address]   Dg Chest 2 View  Result Date: 06/05/2018 CLINICAL DATA:  Right lung opacity EXAM: CHEST - 2 VIEW COMPARISON:  06/02/2018 FINDINGS: Stable right infrahilar opacity. Small bilateral pleural effusions. The heart is normal in size. Kerley B-lines are present at both lung bases laterally. No pneumothorax. Vascularity is within normal limits. IMPRESSION: Stable right infrahilar airspace opacity. Stable bilateral interstitial infiltrates characterized by Charyl Dancer B lines. Continued follow-up until complete resolution is recommended. Electronically Signed   By: Jolaine Click M.D.   On: 06/05/2018 16:14   Ct Angio Chest Pe W/cm &/or Wo Cm  Result Date: 06/05/2018 CLINICAL DATA:  Shortness of breath EXAM: CT ANGIOGRAPHY CHEST WITH CONTRAST TECHNIQUE: Multidetector CT imaging of the chest was performed using the standard protocol during bolus administration of intravenous contrast. Multiplanar CT image reconstructions and MIPs were obtained to evaluate the vascular anatomy. CONTRAST:  OMNIPAQUE IOHEXOL 350 MG/ML SOLN COMPARISON:  Chest radiograph June 05, 2018 FINDINGS: Cardiovascular: There Is no demonstrable pulmonary embolus. There is no thoracic aortic aneurysm. No dissection is appreciable. Note that the contrast bolus in the aorta is not sufficient for dissection assessment. Visualized great vessels appear unremarkable. There is no pericardial effusion or pericardial thickening. Heart is mildly enlarged. There is felt to be a degree of pulmonary venous hypertension. Mediastinum/Nodes: Thyroid appears unremarkable. There are scattered subcentimeter mediastinal lymph nodes. There is no appreciable thoracic adenopathy by size criteria. A small amount of pericardial fluid is noted in the subcarinal region, likely physiologic. No esophageal lesions are evident. Lungs/Pleura: There are free-flowing pleural effusions  bilaterally, larger on the right than on the left. There is a degree of underlying centrilobular emphysema. There is atelectatic change in the lung bases. There is interstitial edema bilaterally. There is no frank airspace consolidation. Upper Abdomen: There is reflux of contrast into the inferior vena cava and hepatic veins. Visualized upper abdominal structures otherwise appear unremarkable except for mild abdominal aortic atherosclerosis. Musculoskeletal: There are no blastic or lytic bone lesions. No evident chest wall lesions. Review of the MIP images confirms the above findings. IMPRESSION: 1. No demonstrable pulmonary embolus. No thoracic  aortic aneurysm. No dissection evident; the contrast bolus in the aorta is not sufficient for dissection assessment. 2. Heart mildly enlarged with probable degree of pulmonary venous hypertension. Pleural effusions bilaterally, larger on the right than on the left. There is evident interstitial edema. Question a degree of underlying congestive heart failure. 3. Centrilobular emphysematous change. Areas of atelectatic change. No frank airspace consolidation. 4.  No appreciable adenopathy. 5. Reflux of contrast into the inferior vena cava and hepatic veins, a finding most likely indicative of a degree of increased right heart pressure. Emphysema (ICD10-J43.9). Electronically Signed   By: Bretta BangWilliam  Woodruff III M.D.   On: 06/05/2018 17:39    Pending Labs Unresulted Labs (From admission, onward)    Start     Ordered   06/05/18 2014  Troponin I - Now Then Q6H  Now then every 6 hours,   R     06/05/18 2013          Vitals/Pain Today's Vitals   06/05/18 1845 06/05/18 1900 06/05/18 1930 06/05/18 2000  BP:  (!) 125/101 (!) 121/109 111/87  Pulse: (!) 116 (!) 108  (!) 107  Resp: (!) 27 (!) 21    Temp:      TempSrc:      SpO2: 96% 97% 96% 92%  Weight:      Height:      PainSc:        Isolation Precautions No active isolations  Medications Medications   sodium chloride (PF) 0.9 % injection (has no administration in time range)  furosemide (LASIX) injection 40 mg (has no administration in time range)  acetaminophen (TYLENOL) tablet 650 mg (has no administration in time range)  omeprazole (PRILOSEC OTC) EC tablet 20 mg (has no administration in time range)  clonazePAM (KLONOPIN) tablet 0.25-0.5 mg (has no administration in time range)  enoxaparin (LOVENOX) injection 40 mg (has no administration in time range)  iohexol (OMNIPAQUE) 350 MG/ML injection 100 mL (100 mLs Intravenous Contrast Given 06/05/18 1720)  furosemide (LASIX) injection 20 mg (20 mg Intravenous Given 06/05/18 1757)  furosemide (LASIX) injection 20 mg (20 mg Intravenous Given 06/05/18 1822)  LORazepam (ATIVAN) injection 0.5 mg (0.5 mg Intravenous Given 06/05/18 1822)    Mobility walks

## 2018-06-05 NOTE — ED Provider Notes (Signed)
[facility]-EMERGENCY DEPT Provider Note   CSN: [id number] Arrival date & time: 06/05/18  1554    History   Chief Complaint Chief Complaint  Patient presents with   Shortness of Breath    HPI Austin [name] is a 53 y.o. male senting for evaluation of shortness of breath.  Patient states of the past month, he has been having shortness of breath.  Is mostly at night.  However, over the past week, he has had worsening shortness of breath.  He reports coughing only at night.  No fevers.  He denies history of similar.  Patient used to smoke daily, quit 1 month ago.  He denies chest pain, nausea, vomiting, dental pain, urinary symptoms, normal bowel movements.  He denies leg pain or swelling.  He travels frequently for work, often stays in hotel rooms.  He has traveled more than 4 hours in the past several months, on a plane trip to [facility].  He is not on hormones.  He denies history of cancer or previous PE/DVT. Patient was seen by his primary care doctor, had an elevated d-dimer at 2.02, and an elevated BNP at 544.  Was sent to the ER for further evaluation. Patient states his only other medical problems include history of peptic ulcers for which he takes sick.  He denies recent reflux symptoms.  Also reports anxiety, for which he was started on Klonopin this week.     HPI  Past Medical History:  Diagnosis Date   Anxiety    Gastric ulcer    GERD (gastroesophageal reflux disease)    Guillain Barr syndrome (HCC) 1984   COMPLETE RECOVERY; triggered from flu illness   Headache(784.0)    hx of tension-none recent   Hematemesis 09/07/2016   History of kidney stones 09/19/12   left ureteral stone-SURGERY 8/6 URETEROSCOPY / STENT PLACEMENT    Partial gastric outlet obstruction    UGIB (upper gastrointestinal bleed) 09/07/2016    Patient Active Problem List   Diagnosis Date Noted   Duodenal stenosis    Gastric ulcer without hemorrhage or  perforation    Iron deficiency anemia 09/25/2016   Peptic ulcer of stomach 09/25/2016   Duodenal ulcer 09/25/2016   NSAID long-term use     Past Surgical History:  Procedure Laterality Date   BALLOON DILATION N/A 11/15/2016   Procedure: BALLOON DILATION;  Surgeon: Benancio Deeds, MD;  Location: [facility];  Service: Gastroenterology;  Laterality: N/A;   CYSTO, LEFT RGP, DIAGNOSTIC URETEROSCOPY AND STENT PLACEMENT  09/24/2012   CYSTOSCOPY WITH RETROGRADE PYELOGRAM, URETEROSCOPY AND STENT PLACEMENT Left 09/24/2012   Procedure: CYSTOSCOPY WITH RETROGRADE PYELOGRAM, DIAGNOSTIC URETEROSCOPY AND STENT PLACEMENT;  Surgeon: Sebastian Ache, MD;  Location: [facility];  Service: Urology;  Laterality: Left;   CYSTOSCOPY WITH RETROGRADE PYELOGRAM, URETEROSCOPY AND STENT PLACEMENT Left 10/08/2012   Procedure: CYSTOSCOPY WITH RETROGRADE PYELOGRAM, URETEROSCOPY AND STENT PLACEMENT;  Surgeon: Sebastian Ache, MD;  Location: [facility];  Service: Urology;  Laterality: Left;  1 HR NEEDS DIGITAL URETEROSCOPE    ESOPHAGOGASTRODUODENOSCOPY N/A 09/08/2016   Procedure: ESOPHAGOGASTRODUODENOSCOPY (EGD);  Surgeon: Ruffin Frederick, MD;  Location: [facility];  Service: Gastroenterology;  Laterality: N/A;   ESOPHAGOGASTRODUODENOSCOPY N/A 11/15/2016   Procedure: ESOPHAGOGASTRODUODENOSCOPY (EGD);  Surgeon: Benancio Deeds, MD;  Location: [facility];  Service: Gastroenterology;  Laterality: N/A;   ESOPHAGOGASTRODUODENOSCOPY N/A 12/31/2016   Procedure: ESOPHAGOGASTRODUODENOSCOPY (EGD);  Surgeon: Benancio Deeds, MD;  Location: [facility];  Service: Gastroenterology;  Laterality: N/A;   ESOPHAGOGASTRODUODENOSCOPY (  EGD) WITH PROPOFOL N/A 10/15/2016   Procedure: ESOPHAGOGASTRODUODENOSCOPY (EGD) WITH PROPOFOL; Duodenal dilation;  Surgeon: Ruffin FrederickArmbruster, Steven Paul, MD;  [name];  Service: Gastroenterology;  Laterality: N/A;   HOLMIUM LASER APPLICATION Left 10/08/2012   Procedure: HOLMIUM  LASER APPLICATION;  Surgeon: Sebastian Acheheodore Manny, MD;  Location: [facility];  Service: Urology;  Laterality: Left;   SAVORY DILATION N/A 12/31/2016   Procedure: POSSIBLE SAVORY DILATION;  Surgeon: Benancio DeedsArmbruster, Steven P, MD;  Location: [facility];  Service: Gastroenterology;  Laterality: N/A;   TONSILLECTOMY     t&a as a child   wisdom teeth extracted          Home Medications    Prior to Admission medications   Medication Sig Start Date End Date Taking? Authorizing Provider  acetaminophen (TYLENOL) 500 MG tablet Take 1,000 mg 2 (two) times daily as needed by mouth for moderate pain.   Yes [provider]  clonazePAM (KLONOPIN) 0.5 MG tablet Take 0.5-1 tablets (0.25-0.5 mg total) by mouth daily as needed for anxiety. 06/02/18  Yes Koberlein, Junell C, MD  MELATONIN PO Take 1 tablet by mouth at bedtime as needed (sleep).   Yes [provider]  omeprazole (PRILOSEC OTC) 20 MG tablet Take 20 mg by mouth daily.   Yes [provider]  pantoprazole (PROTONIX) 40 MG tablet Take 1 tablet (40 mg total) by mouth 2 (two) times daily. Call our office for further refills. Patient not taking: Reported on 06/05/2018 11/04/17   Sammuel CooperEsterwood, Amy S, PA-C    Family History Family History  Problem Relation Age of Onset   Heart disease Mother    Congestive Heart Failure Mother    Ulcerative colitis Father    Hemachromatosis Father    Rheum arthritis Father    Heart disease Brother [id number]       stenting    Social History Social History   Tobacco Use   Smoking status: Former Smoker    Packs/day: 1.50    Years: 28.00    Pack years: 42.00    Types: Cigarettes    Last attempt to quit: 05/02/2018    Years since quitting: 0.0   Smokeless tobacco: Never Used  Substance Use Topics   Alcohol use: Yes    Comment: occasional   Drug use: No    Types: Marijuana    Comment: in the past     Allergies   Patient has no known allergies.   Review of Systems Review of Systems    Respiratory: Positive for shortness of breath.   Psychiatric/Behavioral: The patient is nervous/anxious.   All other systems reviewed and are negative.    Physical Exam Updated Vital Signs BP (!) 125/101    Pulse (!) 108    Temp 97.6 F (36.4 C) (Oral)    Resp (!) 21    Ht 6' (1.829 m)    Wt 81.9 kg    SpO2 97%    BMI 24.49 kg/m   Physical Exam Vitals signs and nursing note reviewed.  Constitutional:      General: He is not in acute distress.    Appearance: He is well-developed.     Comments: Appears nontoxic.  HENT:     Head: Normocephalic and atraumatic.  Eyes:     Conjunctiva/sclera: Conjunctivae normal.     Pupils: Pupils are equal, round, and reactive to light.  Neck:     Musculoskeletal: Normal range of motion and neck supple.  Cardiovascular:     Rate and Rhythm: Regular  rhythm. Tachycardia present.     Comments: Mildly tachycardic around 105 Pulmonary:     Effort: Pulmonary effort is normal. No respiratory distress.     Breath sounds: Normal breath sounds. No wheezing.     Comments: Speaking in full sentences.  Clear lung sounds in all fields.  No accessory muscle use or signs of respiratory distress. Abdominal:     General: There is no distension.     Palpations: Abdomen is soft. There is no mass.     Tenderness: There is no abdominal tenderness. There is no guarding or rebound.  Musculoskeletal: Normal range of motion.     Comments: No leg pain or swelling pedal pulses intact  Skin:    General: Skin is warm and dry.     Capillary Refill: Capillary refill takes less than 2 seconds.  Neurological:     Mental Status: He is alert and oriented to person, place, and time.      ED Treatments / Results  Labs (all labs ordered are listed, but only abnormal results are displayed) Labs Reviewed  CBC - Abnormal; Notable for the following components:      Result Value   RBC 4.16 (*)    All other components within normal limits  BASIC METABOLIC PANEL - Abnormal;  Notable for the following components:   Glucose, Bld 119 (*)    All other components within normal limits  BLOOD GAS, VENOUS - Abnormal; Notable for the following components:   pCO2, Ven 43.1 (*)    pO2, Ven 88.8 (*)    All other components within normal limits  TROPONIN I    EKG None  Radiology Dg Chest 2 View  Result Date: 06/05/2018 CLINICAL DATA:  Right lung opacity EXAM: CHEST - 2 VIEW COMPARISON:  06/02/2018 FINDINGS: Stable right infrahilar opacity. Small bilateral pleural effusions. The heart is normal in size. Kerley B-lines are present at both lung bases laterally. No pneumothorax. Vascularity is within normal limits. IMPRESSION: Stable right infrahilar airspace opacity. Stable bilateral interstitial infiltrates characterized by Charyl Dancer B lines. Continued follow-up until complete resolution is recommended. Electronically Signed   By: Jolaine Click M.D.   On: 06/05/2018 16:14   Ct Angio Chest Pe W/cm &/or Wo Cm  Result Date: 06/05/2018 CLINICAL DATA:  Shortness of breath EXAM: CT ANGIOGRAPHY CHEST WITH CONTRAST TECHNIQUE: Multidetector CT imaging of the chest was performed using the standard protocol during bolus administration of intravenous contrast. Multiplanar CT image reconstructions and MIPs were obtained to evaluate the vascular anatomy. CONTRAST:  OMNIPAQUE IOHEXOL 350 MG/ML SOLN COMPARISON:  Chest radiograph June 05, 2018 FINDINGS: Cardiovascular: There Is no demonstrable pulmonary embolus. There is no thoracic aortic aneurysm. No dissection is appreciable. Note that the contrast bolus in the aorta is not sufficient for dissection assessment. Visualized great vessels appear unremarkable. There is no pericardial effusion or pericardial thickening. Heart is mildly enlarged. There is felt to be a degree of pulmonary venous hypertension. Mediastinum/Nodes: Thyroid appears unremarkable. There are scattered subcentimeter mediastinal lymph nodes. There is no appreciable thoracic  adenopathy by size criteria. A small amount of pericardial fluid is noted in the subcarinal region, likely physiologic. No esophageal lesions are evident. Lungs/Pleura: There are free-flowing pleural effusions bilaterally, larger on the right than on the left. There is a degree of underlying centrilobular emphysema. There is atelectatic change in the lung bases. There is interstitial edema bilaterally. There is no frank airspace consolidation. Upper Abdomen: There is reflux of contrast into the inferior vena  cava and hepatic veins. Visualized upper abdominal structures otherwise appear unremarkable except for mild abdominal aortic atherosclerosis. Musculoskeletal: There are no blastic or lytic bone lesions. No evident chest wall lesions. Review of the MIP images confirms the above findings. IMPRESSION: 1. No demonstrable pulmonary embolus. No thoracic aortic aneurysm. No dissection evident; the contrast bolus in the aorta is not sufficient for dissection assessment. 2. Heart mildly enlarged with probable degree of pulmonary venous hypertension. Pleural effusions bilaterally, larger on the right than on the left. There is evident interstitial edema. Question a degree of underlying congestive heart failure. 3. Centrilobular emphysematous change. Areas of atelectatic change. No frank airspace consolidation. 4.  No appreciable adenopathy. 5. Reflux of contrast into the inferior vena cava and hepatic veins, a finding most likely indicative of a degree of increased right heart pressure. Emphysema (ICD10-J43.9). Electronically Signed   By: Bretta Bang III M.D.   On: 06/05/2018 17:39    Procedures .Critical Care Performed by: Alveria Apley, PA-C Authorized by: Alveria Apley, PA-C   Critical care provider statement:    Critical care time (minutes):  45   Critical care time was exclusive of:  Separately billable procedures and treating other patients and teaching time   Critical care was necessary to  treat or prevent imminent or life-threatening deterioration of the following conditions:  Respiratory failure   Critical care was time spent personally by me on the following activities:  Blood draw for specimens, development of treatment plan with patient or surrogate, evaluation of patient's response to treatment, examination of patient, obtaining history from patient or surrogate, ordering and performing treatments and interventions, ordering and review of radiographic studies, ordering and review of laboratory studies, re-evaluation of patient's condition, pulse oximetry and review of old charts   I assumed direction of critical care for this patient from another provider in my specialty: no   Comments:     Concern for flash pulmonary edema to increased respiratory drive, low sats, and pleural effusions on CT.  Patient placed on BiPAP and admitted.   (including critical care time)  Medications Ordered in ED Medications  sodium chloride (PF) 0.9 % injection (has no administration in time range)  iohexol (OMNIPAQUE) 350 MG/ML injection 100 mL (100 mLs Intravenous Contrast Given 06/05/18 1720)  furosemide (LASIX) injection 20 mg (20 mg Intravenous Given 06/05/18 1757)  furosemide (LASIX) injection 20 mg (20 mg Intravenous Given 06/05/18 1822)  LORazepam (ATIVAN) injection 0.5 mg (0.5 mg Intravenous Given 06/05/18 1822)     Initial Impression / Assessment and Plan / ED Course  I have reviewed the triage vital signs and the nursing notes.  Pertinent labs & imaging results that were available during my care of the patient were reviewed by me and considered in my medical decision making (see chart for details).        Patient presenting for evaluation of shortness of breath, with an elevated d-dimer and BNP.  Concern for possible PE.  Patient is mildly tachycardic, reports worsening shortness of breath over this past month.  No fever or persistent cough, low suspicion for COVID.  Will order CTA.   Will repeat basic labs, as CBC and BMP were last drawn 3 days ago.  CTA negative for PE.  Does show bilateral pleural effusions and signs of failure.  Per chart review, patient had low sats documented.  I discussed with nurse, who states that he was then placed on 3 L of oxygen.  Patient does not wear oxygen at home.  On reevaluation of the patient, patient now has increased respiratory effort, and is tachypneic around 25. Informed by RN that patient sats are dropping on oxygen.  Patient placed on a nonrebreather, sats improved to low 90s.  Patient with obvious accessory muscle use and tachypnea up to 35.  Case discussed with attending, Dr. Freida Busman evaluated the patient.  I am concerned for flash pulmonary edema.  Patient given 40 of Lasix.  As patient is low suspicion for COVID, will start on BiPAP, and call for admission.  Discussed with Dr. Loney Loh from [facility], requesting abg prior to admission.   VBG in the chart was actually an ABG, but cannot be changed as the results have been entered.  ABG was reassuring, pH 7.3.  PO2 88, PCO2 40.3.  I believe patient is safe for stepdown.  Will discuss with Dr. Loney Loh.   Pt to be admitted.   Final Clinical Impressions(s) / ED Diagnoses   Final diagnoses:  SOB (shortness of breath)  Hypoxia  Pleural effusion  Acute congestive heart failure, unspecified heart failure type [facility])    ED Discharge Orders    None       Alveria Apley, PA-C 06/05/18 1921    Lorre Nick, MD 06/05/18 [phone]

## 2018-06-05 NOTE — ED Triage Notes (Signed)
Patient complains of shortness of breath which started a month ago and has worsened since. It is increasingly worse when laying down to sleep. Family has noticed more labored breathing from the patient while sleeping.

## 2018-06-05 NOTE — Addendum Note (Signed)
Addended by: Nancy Fetter on: 06/05/2018 12:33 PM   Modules accepted: Orders

## 2018-06-06 ENCOUNTER — Observation Stay (HOSPITAL_COMMUNITY): Payer: Managed Care, Other (non HMO)

## 2018-06-06 ENCOUNTER — Inpatient Hospital Stay (HOSPITAL_COMMUNITY): Payer: Managed Care, Other (non HMO)

## 2018-06-06 DIAGNOSIS — K219 Gastro-esophageal reflux disease without esophagitis: Secondary | ICD-10-CM | POA: Diagnosis present

## 2018-06-06 DIAGNOSIS — R0602 Shortness of breath: Secondary | ICD-10-CM | POA: Diagnosis present

## 2018-06-06 DIAGNOSIS — K259 Gastric ulcer, unspecified as acute or chronic, without hemorrhage or perforation: Secondary | ICD-10-CM

## 2018-06-06 DIAGNOSIS — R9431 Abnormal electrocardiogram [ECG] [EKG]: Secondary | ICD-10-CM

## 2018-06-06 DIAGNOSIS — I110 Hypertensive heart disease with heart failure: Secondary | ICD-10-CM | POA: Diagnosis present

## 2018-06-06 DIAGNOSIS — I340 Nonrheumatic mitral (valve) insufficiency: Secondary | ICD-10-CM | POA: Diagnosis present

## 2018-06-06 DIAGNOSIS — G610 Guillain-Barre syndrome: Secondary | ICD-10-CM | POA: Diagnosis not present

## 2018-06-06 DIAGNOSIS — I82409 Acute embolism and thrombosis of unspecified deep veins of unspecified lower extremity: Secondary | ICD-10-CM | POA: Diagnosis not present

## 2018-06-06 DIAGNOSIS — J432 Centrilobular emphysema: Secondary | ICD-10-CM | POA: Diagnosis present

## 2018-06-06 DIAGNOSIS — Y92230 Patient room in hospital as the place of occurrence of the external cause: Secondary | ICD-10-CM | POA: Diagnosis not present

## 2018-06-06 DIAGNOSIS — J9601 Acute respiratory failure with hypoxia: Secondary | ICD-10-CM | POA: Diagnosis present

## 2018-06-06 DIAGNOSIS — R7989 Other specified abnormal findings of blood chemistry: Secondary | ICD-10-CM | POA: Diagnosis present

## 2018-06-06 DIAGNOSIS — F419 Anxiety disorder, unspecified: Secondary | ICD-10-CM

## 2018-06-06 DIAGNOSIS — I361 Nonrheumatic tricuspid (valve) insufficiency: Secondary | ICD-10-CM

## 2018-06-06 DIAGNOSIS — I272 Pulmonary hypertension, unspecified: Secondary | ICD-10-CM | POA: Diagnosis present

## 2018-06-06 DIAGNOSIS — I5021 Acute systolic (congestive) heart failure: Secondary | ICD-10-CM | POA: Diagnosis present

## 2018-06-06 DIAGNOSIS — Z79899 Other long term (current) drug therapy: Secondary | ICD-10-CM | POA: Diagnosis not present

## 2018-06-06 DIAGNOSIS — I420 Dilated cardiomyopathy: Secondary | ICD-10-CM | POA: Diagnosis present

## 2018-06-06 DIAGNOSIS — Z8249 Family history of ischemic heart disease and other diseases of the circulatory system: Secondary | ICD-10-CM | POA: Diagnosis not present

## 2018-06-06 DIAGNOSIS — E876 Hypokalemia: Secondary | ICD-10-CM | POA: Diagnosis not present

## 2018-06-06 DIAGNOSIS — T502X5A Adverse effect of carbonic-anhydrase inhibitors, benzothiadiazides and other diuretics, initial encounter: Secondary | ICD-10-CM | POA: Diagnosis not present

## 2018-06-06 DIAGNOSIS — Z87891 Personal history of nicotine dependence: Secondary | ICD-10-CM | POA: Diagnosis not present

## 2018-06-06 DIAGNOSIS — Z79890 Hormone replacement therapy (postmenopausal): Secondary | ICD-10-CM | POA: Diagnosis not present

## 2018-06-06 DIAGNOSIS — F17201 Nicotine dependence, unspecified, in remission: Secondary | ICD-10-CM | POA: Diagnosis not present

## 2018-06-06 DIAGNOSIS — Z8669 Personal history of other diseases of the nervous system and sense organs: Secondary | ICD-10-CM | POA: Diagnosis not present

## 2018-06-06 DIAGNOSIS — Z8719 Personal history of other diseases of the digestive system: Secondary | ICD-10-CM | POA: Diagnosis not present

## 2018-06-06 LAB — MRSA PCR SCREENING: MRSA by PCR: NEGATIVE

## 2018-06-06 LAB — ECHOCARDIOGRAM COMPLETE
Height: 72 in
Weight: 2737.23 oz

## 2018-06-06 LAB — TROPONIN I
Troponin I: <0.03 ng/mL (ref <0.03)
Troponin I: <0.03 ng/mL (ref <0.03)

## 2018-06-06 MED ORDER — ORAL CARE MOUTH RINSE
15.0000 mL | Freq: Two times a day (BID) | OROMUCOSAL | Status: DC
  Administered 2018-06-06 – 2018-06-09 (×7): 15 mL via OROMUCOSAL

## 2018-06-06 MED ORDER — CLONAZEPAM 0.5 MG PO TABS
0.2500 mg | ORAL_TABLET | Freq: Two times a day (BID) | ORAL | Status: DC | PRN
  Administered 2018-06-06 – 2018-06-09 (×7): 0.5 mg via ORAL
  Filled 2018-06-06 (×14): qty 1

## 2018-06-06 MED ORDER — CHLORHEXIDINE GLUCONATE CLOTH 2 % EX PADS
6.0000 | MEDICATED_PAD | Freq: Every day | CUTANEOUS | Status: DC
  Administered 2018-06-06 – 2018-06-07 (×2): 6 via TOPICAL

## 2018-06-06 NOTE — Progress Notes (Signed)
  Echocardiogram 2D Echocardiogram has been performed.  Leta Jungling M 06/06/2018, 8:59 AM

## 2018-06-06 NOTE — Progress Notes (Signed)
Bilateral lower extremity venous duplex has been completed. Preliminary results can be found in CV Proc through chart review.   06/06/18 3:25 PM Olen Cordial RVT

## 2018-06-06 NOTE — Progress Notes (Signed)
PROGRESS NOTE    Austin [name]  LKT:[id number] DOB: [birth year] DOA: 06/05/2018 PCP: Wynn Banker, MD   Brief Narrative:  HPI on 06/05/2018 by Dr. John Giovanni Austin [name] is a 54 y.o. 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  Patient was seen by his PCP and found to have an elevated d-dimer of 2.05 and elevated BNP of 544.  He was sent to the ER for further evaluation.  Patient states he has been feeling short of breath since October 2019 and symptoms have been getting progressively worse, especially for the past 1 month.  He feels short of breath both with exertion and at rest.  Sometimes his shortness of breath is accompanied by wheezing.  Symptoms are especially worse at night when he lays down flat to sleep.  He is also having a dry cough.  No weight gain or lower extremity edema.  Denies any history of CHF or lung disease. States he was previously smoking cigarettes but quit a month ago.  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  His last trip was the end of February to [place].  He has stayed at home for the past 1 month.  Denies any fevers, chills, sick contacts, or exposure to any individual with confirmed COVID-19. Assessment & Plan   Acute hypoxic respiratory failure with orthopnea-possibly secondary to new onset CHF -Ongoing for the past several months, however worsening. Patient admits to now seeing a PCP until recently.  -Patient noted to have hypoxia, 85% on room air upon presentation -CTA chest negative for PE. Heart mildly enlarged with probable degree of pulmonary venous hypertension.  Pleural effusions bilaterally,L>R.  Centrilobular emphysematous change. -Chest x-ray shows stable right infrahilar airspace opacity.  Stable bilateral interstitial infiltrates,  curly B-lines. -BNP on admission 544.  D-dimer 2.05 -Echocardiogram ordered -Patient placed on IV Lasix 40 mg twice daily -Continue to monitor intake and output, daily weights -Patient currently afebrile with no leukocytosis, infection unlikely  Elevated D-Dimer -CTA chest negative for PE -Lower ext doppler pending to rule out DVT  T wave inversions on EKG -EKG showed T wave inversions V5 and V6.  No prior EKG for comparison -No complaints of chest pain -Troponin currently being cycled and unremarkable  Anxiety -Continue Klonopin, will increase frequency  Peptic ulcer disease, GERD -Continue PPI  DVT Prophylaxis Lovenox  Code Status: Full  Family Communication: None at bedside  Disposition Plan: Admitted.  Pending work-up.  Likely home when stable  Consultants None  Procedures  Echocardiogram  Antibiotics   Anti-infectives (From admission, onward)   None      Subjective:   Austin [name] seen and examined today.  Patient states that his breathing has improved immensely since admission and that he was able to lay flat last night.  Continues to need oxygen and has some shortness of breath.  Denies current chest pain, abdominal pain, nausea or vomiting, diarrhea or constipation, dizziness or headache.  Objective:   Vitals:   06/06/18 0500 06/06/18 0600 06/06/18 0740 06/06/18 0817  BP: (!) 115/93 (!) 136/108 127/84   Pulse: 91 (!) 103 100   Resp: 20 (!) 27 20   Temp:    98.1 F (36.7 C)  TempSrc:    Oral  SpO2: 92% 95% 93%   Weight: 77.6 kg  Height:        Intake/Output Summary (Last 24 hours) at 06/06/2018 1003 Last data filed at 06/06/2018 0752 Gross per 24 hour  Intake 840 ml  Output 1700 ml  Net -860 ml   Filed Weights   06/05/18 1627 06/05/18 2236 06/06/18 0500  Weight: 81.9 kg 78.2 kg 77.6 kg    Exam  General: Well developed, well nourished, NAD, appears stated age  HEENT: NCAT, mucous membranes moist.   Neck: Supple  Cardiovascular:  S1 S2 auscultated, RRR, no murmur  Respiratory: Clear to auscultation bilaterally. No wheezing  Abdomen: Soft, nontender, nondistended, + bowel sounds  Extremities: warm dry without cyanosis clubbing or edema  Neuro: AAOx3, nonfocal  Psych: Appropriate mood and affect, pleasant   Data Reviewed: I have personally reviewed following labs and imaging studies  CBC: Recent Labs  Lab 06/02/18 1329 06/05/18 1631  WBC 8.7 8.7  NEUTROABS 6.2  --   HGB 14.5 13.9  HCT 42.2 41.3  MCV 98.3 99.3  PLT 255.0 260   Basic Metabolic Panel: Recent Labs  Lab 06/02/18 1329 06/05/18 1631  NA 140 139  K 3.8 3.7  CL 105 109  CO2 25 22  GLUCOSE 87 119*  BUN 18 20  CREATININE 0.67 0.80  CALCIUM 9.0 8.9   GFR: Estimated Creatinine Clearance: 121.3 mL/min (by C-G formula based on SCr of 0.8 mg/dL). Liver Function Tests: Recent Labs  Lab 06/02/18 1329  AST 15  ALT 15  ALKPHOS 93  BILITOT 1.0  PROT 6.8  ALBUMIN 4.1   No results for input(s): LIPASE, AMYLASE in the last 168 hours. No results for input(s): AMMONIA in the last 168 hours. Coagulation Profile: No results for input(s): INR, PROTIME in the last 168 hours. Cardiac Enzymes: Recent Labs  Lab 06/05/18 1826 06/05/18 2143 06/06/18 0227 06/06/18 0844  TROPONINI <0.03 0.03* <0.03 <0.03   BNP (last 3 results) Recent Labs    06/05/18 1159  PROBNP 544.0*   HbA1C: No results for input(s): HGBA1C in the last 72 hours. CBG: No results for input(s): GLUCAP in the last 168 hours. Lipid Profile: No results for input(s): CHOL, HDL, LDLCALC, TRIG, CHOLHDL, LDLDIRECT in the last 72 hours. Thyroid Function Tests: No results for input(s): TSH, T4TOTAL, FREET4, T3FREE, THYROIDAB in the last 72 hours. Anemia Panel: No results for input(s): VITAMINB12, FOLATE, FERRITIN, TIBC, IRON, RETICCTPCT in the last 72 hours. Urine analysis:    Component Value Date/Time   COLORURINE YELLOW 07/04/2010 1515   APPEARANCEUR CLEAR 07/04/2010  1515   LABSPEC 1.031 (H) 07/04/2010 1515   PHURINE 5.5 07/04/2010 1515   GLUCOSEU NEGATIVE 07/04/2010 1515   HGBUR NEGATIVE 07/04/2010 1515   BILIRUBINUR neg 09/11/2012 2009   KETONESUR 40 (A) 07/04/2010 1515   PROTEINUR neg 09/11/2012 2009   PROTEINUR NEGATIVE 07/04/2010 1515   UROBILINOGEN 0.2 09/11/2012 2009   UROBILINOGEN 0.2 07/04/2010 1515   NITRITE neg 09/11/2012 2009   NITRITE NEGATIVE 07/04/2010 1515   LEUKOCYTESUR Negative 09/11/2012 2009   Sepsis Labs: @LABRCNTIP (procalcitonin:4,lacticidven:4)  ) Recent Results (from the past 240 hour(s))  MRSA PCR Screening     Status: None   Collection Time: 06/05/18 11:16 PM  Result Value Ref Range Status   MRSA by PCR NEGATIVE NEGATIVE Final    Comment:        The GeneXpert MRSA Assay (FDA approved for NASAL specimens only), is one component of a comprehensive MRSA colonization surveillance program. It is not intended to diagnose MRSA infection nor to guide  or monitor treatment for MRSA infections. Performed at [facility], [address] [place], [place] [id number]       Radiology Studies: Dg Chest 2 View  Result Date: 06/05/2018 CLINICAL DATA:  Right lung opacity EXAM: CHEST - 2 VIEW COMPARISON:  06/02/2018 FINDINGS: Stable right infrahilar opacity. Small bilateral pleural effusions. The heart is normal in size. Kerley B-lines are present at both lung bases laterally. No pneumothorax. Vascularity is within normal limits. IMPRESSION: Stable right infrahilar airspace opacity. Stable bilateral interstitial infiltrates characterized by Charyl DancerKerley B lines. Continued follow-up until complete resolution is recommended. Electronically Signed   By: Jolaine ClickArthur  Hoss M.D.   On: 06/05/2018 16:14   Ct Angio Chest Pe W/cm &/or Wo Cm  Result Date: 06/05/2018 CLINICAL DATA:  Shortness of breath EXAM: CT ANGIOGRAPHY CHEST WITH CONTRAST TECHNIQUE: Multidetector CT imaging of the chest was performed using the standard protocol  during bolus administration of intravenous contrast. Multiplanar CT image reconstructions and MIPs were obtained to evaluate the vascular anatomy. CONTRAST:  100mL OMNIPAQUE IOHEXOL 350 MG/ML SOLN COMPARISON:  Chest radiograph June 05, 2018 FINDINGS: Cardiovascular: There Is no demonstrable pulmonary embolus. There is no thoracic aortic aneurysm. No dissection is appreciable. Note that the contrast bolus in the aorta is not sufficient for dissection assessment. Visualized great vessels appear unremarkable. There is no pericardial effusion or pericardial thickening. Heart is mildly enlarged. There is felt to be a degree of pulmonary venous hypertension. Mediastinum/Nodes: Thyroid appears unremarkable. There are scattered subcentimeter mediastinal lymph nodes. There is no appreciable thoracic adenopathy by size criteria. A small amount of pericardial fluid is noted in the subcarinal region, likely physiologic. No esophageal lesions are evident. Lungs/Pleura: There are free-flowing pleural effusions bilaterally, larger on the right than on the left. There is a degree of underlying centrilobular emphysema. There is atelectatic change in the lung bases. There is interstitial edema bilaterally. There is no frank airspace consolidation. Upper Abdomen: There is reflux of contrast into the inferior vena cava and hepatic veins. Visualized upper abdominal structures otherwise appear unremarkable except for mild abdominal aortic atherosclerosis. Musculoskeletal: There are no blastic or lytic bone lesions. No evident chest wall lesions. Review of the MIP images confirms the above findings. IMPRESSION: 1. No demonstrable pulmonary embolus. No thoracic aortic aneurysm. No dissection evident; the contrast bolus in the aorta is not sufficient for dissection assessment. 2. Heart mildly enlarged with probable degree of pulmonary venous hypertension. Pleural effusions bilaterally, larger on the right than on the left. There is evident  interstitial edema. Question a degree of underlying congestive heart failure. 3. Centrilobular emphysematous change. Areas of atelectatic change. No frank airspace consolidation. 4.  No appreciable adenopathy. 5. Reflux of contrast into the inferior vena cava and hepatic veins, a finding most likely indicative of a degree of increased right heart pressure. Emphysema (ICD10-J43.9). Electronically Signed   By: Bretta BangWilliam  Woodruff III M.D.   On: 06/05/2018 17:39     Scheduled Meds:  Chlorhexidine Gluconate Cloth  6 each Topical Daily   enoxaparin (LOVENOX) injection  40 mg Subcutaneous QHS   furosemide  40 mg Intravenous Q12H   mouth rinse  15 mL Mouth Rinse BID   pantoprazole  40 mg Oral Daily   Continuous Infusions:   LOS: 0 days   Time Spent in minutes   45 minutes  Cheyenne Bordeaux D.O. on 06/06/2018 at 10:03 AM  Between 7am to 7pm - Please see pager noted on [email or web address]  After 7pm go to [email or web address]  And look for the night coverage person covering for me after hours  [facility] Office  [phone]

## 2018-06-07 DIAGNOSIS — E876 Hypokalemia: Secondary | ICD-10-CM

## 2018-06-07 DIAGNOSIS — I5021 Acute systolic (congestive) heart failure: Secondary | ICD-10-CM

## 2018-06-07 LAB — BASIC METABOLIC PANEL
Anion gap: 11 (ref 5–15)
BUN: 21 mg/dL — ABNORMAL HIGH (ref 6–20)
CO2: 28 mmol/L (ref 22–32)
Calcium: 9 mg/dL (ref 8.9–10.3)
Chloride: 100 mmol/L (ref 98–111)
Creatinine, Ser: 0.85 mg/dL (ref 0.61–1.24)
GFR calc Af Amer: >60 mL/min (ref >60)
GFR calc non Af Amer: >60 mL/min (ref >60)
Glucose, Bld: 100 mg/dL — ABNORMAL HIGH (ref 70–99)
Potassium: 3.2 mmol/L — ABNORMAL LOW (ref 3.5–5.1)
Sodium: 139 mmol/L (ref 135–145)

## 2018-06-07 LAB — CBC
HCT: 44.5 % (ref 39.0–52.0)
Hemoglobin: 15 g/dL (ref 13.0–17.0)
MCH: 33.4 pg (ref 26.0–34.0)
MCHC: 33.7 g/dL (ref 30.0–36.0)
MCV: 99.1 fL (ref 80.0–100.0)
Platelets: 274 10*3/uL (ref 150–400)
RBC: 4.49 MIL/uL (ref 4.22–5.81)
RDW: 12.8 % (ref 11.5–15.5)
WBC: 10.2 10*3/uL (ref 4.0–10.5)
nRBC: 0 % (ref 0.0–0.2)

## 2018-06-07 MED ORDER — POTASSIUM CHLORIDE CRYS ER 20 MEQ PO TBCR
40.0000 meq | EXTENDED_RELEASE_TABLET | ORAL | Status: AC
  Administered 2018-06-07 (×2): 40 meq via ORAL
  Filled 2018-06-07 (×2): qty 2

## 2018-06-07 MED ORDER — POTASSIUM CHLORIDE CRYS ER 20 MEQ PO TBCR: 40.0000 meq | EXTENDED_RELEASE_TABLET | Freq: Once | ORAL | Status: DC

## 2018-06-07 MED ORDER — LISINOPRIL 5 MG PO TABS
2.5000 mg | ORAL_TABLET | Freq: Every day | ORAL | Status: DC
  Administered 2018-06-07: 2.5 mg via ORAL
  Filled 2018-06-07: qty 1

## 2018-06-07 MED ORDER — CARVEDILOL 3.125 MG PO TABS
3.1250 mg | ORAL_TABLET | Freq: Two times a day (BID) | ORAL | Status: DC
  Administered 2018-06-07 – 2018-06-09 (×5): 3.125 mg via ORAL
  Filled 2018-06-07 (×5): qty 1

## 2018-06-07 MED ORDER — SPIRONOLACTONE 12.5 MG HALF TABLET
12.5000 mg | ORAL_TABLET | Freq: Every day | ORAL | Status: DC
  Administered 2018-06-07 – 2018-06-09 (×3): 12.5 mg via ORAL
  Filled 2018-06-07 (×4): qty 1

## 2018-06-07 MED ORDER — CARVEDILOL 3.125 MG PO TABS: 3.1250 mg | ORAL_TABLET | Freq: Two times a day (BID) | ORAL | Status: DC

## 2018-06-07 MED ORDER — SACUBITRIL-VALSARTAN 24-26 MG PO TABS
1.0000 | ORAL_TABLET | Freq: Two times a day (BID) | ORAL | Status: DC
  Administered 2018-06-09: 1 via ORAL
  Filled 2018-06-07: qty 1

## 2018-06-07 NOTE — Progress Notes (Signed)
Patient weaned to RA, O2 sats 93-96%. Patient walked in halls twice and maintained O2 sat of 93% on RA with ambulation.   Earnest Conroy. Clelia Croft, RN

## 2018-06-07 NOTE — Plan of Care (Signed)
°  Problem: Education: °Goal: Ability to demonstrate management of disease process will improve °Outcome: Progressing °Goal: Ability to verbalize understanding of medication therapies will improve °Outcome: Progressing °Goal: Individualized Educational Video(s) °Outcome: Progressing °  °Problem: Activity: °Goal: Capacity to carry out activities will improve °Outcome: Progressing °  °Problem: Cardiac: °Goal: Ability to achieve and maintain adequate cardiopulmonary perfusion will improve °Outcome: Progressing °  °Problem: Health Behavior/Discharge Planning: °Goal: Ability to manage health-related needs will improve °Outcome: Progressing °  °Problem: Clinical Measurements: °Goal: Ability to maintain clinical measurements within normal limits will improve °Outcome: Progressing °Goal: Will remain free from infection °Outcome: Progressing °Goal: Diagnostic test results will improve °Outcome: Progressing °Goal: Respiratory complications will improve °Outcome: Progressing °Goal: Cardiovascular complication will be avoided °Outcome: Progressing °  °Problem: Activity: °Goal: Risk for activity intolerance will decrease °Outcome: Progressing °  °Problem: Nutrition: °Goal: Adequate nutrition will be maintained °Outcome: Progressing °  °Problem: Coping: °Goal: Level of anxiety will decrease °Outcome: Progressing °  °Problem: Elimination: °Goal: Will not experience complications related to bowel motility °Outcome: Progressing °Goal: Will not experience complications related to urinary retention °Outcome: Progressing °  °Problem: Pain Managment: °Goal: General experience of comfort will improve °Outcome: Progressing °  °Problem: Safety: °Goal: Ability to remain free from injury will improve °Outcome: Progressing °  °Problem: Skin Integrity: °Goal: Risk for impaired skin integrity will decrease °Outcome: Progressing °  °

## 2018-06-07 NOTE — Consult Note (Signed)
Cardiology Consultation:   [name] MRN: [id number]; DOB: 11-25-1965  Admit date: 06/05/2018 Date of Consult: 06/07/2018  Primary Care Provider: Wynn Banker, MD Primary Cardiologist: new Primary Electrophysiologist:  None   Patient Profile:   Austin [name] is a 53 y.o. male with a hx of Austin [name] who is being seen today for the evaluation of CHF at the request of Dr Catha Gosselin.  History of Present Illness:   Austin [name] is a 53 year old male not previously seen by cardiology, who was admitted 06/05/2018 with increasing shortness of breath.  Other medical issues include a history of GI bleeding in 2018, and anxiety.  The patient reportedly had been noticing increasing shortness of breath for the last few months.  In the emergency room he was noted to be hypoxic with a PO2 of 88. He was placed on BiPAP. Chest x-ray showed bilateral interstitial infiltrates and curly B-lines.  CT angiogram was negative for pulmonary embolism.  His BNP was 554, his troponin have been negative x3.  Echocardiogram showed significant dilated LV dysfunction with an ejection fraction of 20% and diffuse hypokinesis with inferior akinesis.  Past Medical History:  Diagnosis Date   Anxiety    Gastric ulcer    GERD (gastroesophageal reflux disease)    Guillain Barr syndrome (HCC) 1984   COMPLETE RECOVERY; triggered from flu illness   Headache(784.0)    hx of tension-none recent   Hematemesis 09/07/2016   History of kidney stones 09/19/12   left ureteral stone-SURGERY 8/6 URETEROSCOPY / STENT PLACEMENT    Partial gastric outlet obstruction    UGIB (upper gastrointestinal bleed) 09/07/2016    Past Surgical History:  Procedure Laterality Date   BALLOON DILATION N/A 11/15/2016   Procedure: BALLOON DILATION;  Surgeon: Benancio Deeds, MD;  Location: [facility];  Service: Gastroenterology;  Laterality: N/A;   CYSTO, LEFT RGP, DIAGNOSTIC URETEROSCOPY AND STENT  PLACEMENT  09/24/2012   CYSTOSCOPY WITH RETROGRADE PYELOGRAM, URETEROSCOPY AND STENT PLACEMENT Left 09/24/2012   Procedure: CYSTOSCOPY WITH RETROGRADE PYELOGRAM, DIAGNOSTIC URETEROSCOPY AND STENT PLACEMENT;  Surgeon: Sebastian Ache, MD;  Location: [facility];  Service: Urology;  Laterality: Left;   CYSTOSCOPY WITH RETROGRADE PYELOGRAM, URETEROSCOPY AND STENT PLACEMENT Left 10/08/2012   Procedure: CYSTOSCOPY WITH RETROGRADE PYELOGRAM, URETEROSCOPY AND STENT PLACEMENT;  Surgeon: Sebastian Ache, MD;  Location: [facility];  Service: Urology;  Laterality: Left;  1 HR NEEDS DIGITAL URETEROSCOPE    ESOPHAGOGASTRODUODENOSCOPY N/A 09/08/2016   Procedure: ESOPHAGOGASTRODUODENOSCOPY (EGD);  Surgeon: Ruffin Frederick, MD;  Location: [facility];  Service: Gastroenterology;  Laterality: N/A;   ESOPHAGOGASTRODUODENOSCOPY N/A 11/15/2016   Procedure: ESOPHAGOGASTRODUODENOSCOPY (EGD);  Surgeon: Benancio Deeds, MD;  Location: [facility];  Service: Gastroenterology;  Laterality: N/A;   ESOPHAGOGASTRODUODENOSCOPY N/A 12/31/2016   Procedure: ESOPHAGOGASTRODUODENOSCOPY (EGD);  Surgeon: Benancio Deeds, MD;  Location: [facility];  Service: Gastroenterology;  Laterality: N/A;   ESOPHAGOGASTRODUODENOSCOPY (EGD) WITH PROPOFOL N/A 10/15/2016   Procedure: ESOPHAGOGASTRODUODENOSCOPY (EGD) WITH PROPOFOL; Duodenal dilation;  Surgeon: Ruffin Frederick, MD;  Location: [facility];  Service: Gastroenterology;  Laterality: N/A;   HOLMIUM LASER APPLICATION Left 10/08/2012   Procedure: HOLMIUM LASER APPLICATION;  Surgeon: Sebastian Ache, MD;  Location: [facility];  Service: Urology;  Laterality: Left;   SAVORY DILATION N/A 12/31/2016   Procedure: POSSIBLE SAVORY DILATION;  Surgeon: Benancio Deeds, MD;  Location: [facility];  Service: Gastroenterology;  Laterality: N/A;   TONSILLECTOMY     t&a as a child   wisdom teeth extracted  Home Medications:  Prior to Admission medications   Medication Sig  Start Date End Date Taking? Authorizing Provider  acetaminophen (TYLENOL) 500 MG tablet Take 1,000 mg 2 (two) times daily as needed by mouth for moderate pain.   Yes [provider]  clonazePAM (KLONOPIN) 0.5 MG tablet Take 0.5-1 tablets (0.25-0.5 mg total) by mouth daily as needed for anxiety. 06/02/18  Yes Koberlein, Junell C, MD  MELATONIN PO Take 1 tablet by mouth at bedtime as needed (sleep).   Yes [provider]  omeprazole (PRILOSEC OTC) 20 MG tablet Take 20 mg by mouth daily.   Yes [provider]  pantoprazole (PROTONIX) 40 MG tablet Take 1 tablet (40 mg total) by mouth 2 (two) times daily. Call our office for further refills. Patient not taking: Reported on 06/05/2018 11/04/17   Sammuel Cooper, PA-C    Inpatient Medications: Scheduled Meds:  carvedilol  3.125 mg Oral BID WC   Chlorhexidine Gluconate Cloth  6 each Topical Daily   enoxaparin (LOVENOX) injection  40 mg Subcutaneous QHS   furosemide  40 mg Intravenous Q12H   lisinopril  2.5 mg Oral Daily   mouth rinse  15 mL Mouth Rinse BID   pantoprazole  40 mg Oral Daily   potassium chloride  40 mEq Oral Q4H   Continuous Infusions:  PRN Meds: acetaminophen, albuterol, clonazePAM  Allergies:   No Known Allergies  Social History:   Social History   Socioeconomic History   Marital status: Married    Spouse name: Not on file   Number of children: Not on file   Years of education: Not on file   Highest education level: Not on file  Occupational History   Not on file  Social Needs   Financial resource strain: Not on file   Food insecurity:    Worry: Not on file    Inability: Not on file   Transportation needs:    Medical: Not on file    Non-medical: Not on file  Tobacco Use   Smoking status: Former Smoker    Packs/day: 1.50    Years: 28.00    Pack years: 42.00    Types: Cigarettes    Last attempt to quit: 05/02/2018    Years since quitting: 0.0   Smokeless tobacco:  Never Used  Substance and Sexual Activity   Alcohol use: Yes    Comment: occasional   Drug use: No    Types: Marijuana    Comment: in the past   Sexual activity: Not on file  Lifestyle   Physical activity:    Days per week: Not on file    Minutes per session: Not on file   Stress: Not on file  Relationships   Social connections:    Talks on phone: Not on file    Gets together: Not on file    Attends religious service: Not on file    Active member of club or organization: Not on file    Attends meetings of clubs or organizations: Not on file    Relationship status: Not on file   Intimate partner violence:    Fear of current or ex partner: Not on file    Emotionally abused: Not on file    Physically abused: Not on file    Forced sexual activity: Not on file  Other Topics Concern   Not on file  Social History Narrative   Not on file    Family History:   Family History  Problem  Relation Age of Onset   Heart disease Mother    Congestive Heart Failure Mother    Ulcerative colitis Father    Hemachromatosis Father    Rheum arthritis Father    Heart disease Brother 82       stenting     ROS:  Please see the history of present illness.  All other ROS reviewed and negative.     Physical Exam/Data:   Vitals:   06/06/18 1600 06/06/18 1800 06/06/18 2039 06/07/18 0441  BP:  125/90 106/81 113/84  Pulse:  93 98 95  Resp:   18 18  Temp: 97.6 F (36.4 C) 98.7 F (37.1 C) 98.2 F (36.8 C) 97.7 F (36.5 C)  TempSrc: Oral Oral Oral Oral  SpO2:  98% 94% 96%  Weight:      Height:        Intake/Output Summary (Last 24 hours) at 06/07/2018 1021 Last data filed at 06/07/2018 1019 Gross per 24 hour  Intake 900 ml  Output 2150 ml  Net -1250 ml   Last 3 Weights 06/06/2018 06/05/2018 06/05/2018  Weight (lbs) 171 lb 1.2 oz 172 lb 6.4 oz 180 lb 9.6 oz  Weight (kg) 77.6 kg 78.2 kg 81.92 kg     Body mass index is 23.2 kg/m.  General:  Well nourished, well  developed, in no acute distress HEENT: normal Lymph: no adenopathy Neck: no JVD Endocrine:  No thryomegaly Vascular: No carotid bruits; FA pulses 2+ bilaterally without bruits  Cardiac:  normal S1, S2; RRR; no murmur  Lungs:  clear to auscultation bilaterally, no wheezing, rhonchi or rales  Abd: soft, nontender, no hepatomegaly  Ext: no edema Musculoskeletal:  No deformities, BUE and BLE strength normal and equal Skin: warm and dry  Neuro:  CNs 2-12 intact, no focal abnormalities noted Psych:  Normal affect   EKG:  The EKG was personally reviewed and demonstrates:  NSR, ST 116, LVH with repol changes Telemetry:  Telemetry was personally reviewed and demonstrates:    Relevant CV Studies: Echo 06/06/2018- IMPRESSIONS    1. The left ventricle has a visually estimated ejection fraction of 20%. The cavity size was moderate to severely dilated. Left ventricular diastolic function could not be evaluated.  2. LVEF is approximately 20% with diffuse hypokinesis, inferior akinesis.  3. The right ventricle has moderately reduced systolic function. The cavity was normal. There is no increase in right ventricular wall thickness.  4. Left atrial size was mildly dilated.  5. Right atrial size was mildly dilated.  6. Mitral valve regurgitation is mild to moderate by color flow Doppler.  7. The aortic valve is tricuspid. Mild thickening of the aortic valve.  Laboratory Data:  Chemistry Recent Labs  Lab 06/02/18 1329 06/05/18 1631 06/07/18 0425  NA 140 139 139  K 3.8 3.7 3.2*  CL 105 109 100  CO2 25 22 28   GLUCOSE 87 119* 100*  BUN 18 20 21*  CREATININE 0.67 0.80 0.85  CALCIUM 9.0 8.9 9.0  GFRNONAA  --  >60 >60  GFRAA  --  >60 >60  ANIONGAP  --  8 11    Recent Labs  Lab 06/02/18 1329  PROT 6.8  ALBUMIN 4.1  AST 15  ALT 15  ALKPHOS 93  BILITOT 1.0   Hematology Recent Labs  Lab 06/02/18 1329 06/05/18 1631 06/07/18 0425  WBC 8.7 8.7 10.2  RBC 4.29 4.16* 4.49  HGB 14.5  13.9 15.0  HCT 42.2 41.3 44.5  MCV 98.3 99.3 99.1  MCH  --  33.4 33.4  MCHC 34.3 33.7 33.7  RDW 13.2 13.0 12.8  PLT 255.0 260 274   Cardiac Enzymes Recent Labs  Lab 06/05/18 1826 06/05/18 2143 06/06/18 0227 06/06/18 0844  TROPONINI <0.03 0.03* <0.03 <0.03   No results for input(s): TROPIPOC in the last 168 hours.  BNP Recent Labs  Lab 06/05/18 1159  PROBNP 544.0*    DDimer  Recent Labs  Lab 06/05/18 1206  DDIMER 2.05*    Radiology/Studies:  Dg Chest 2 View  Result Date: 06/05/2018 CLINICAL DATA:  Right lung opacity EXAM: CHEST - 2 VIEW COMPARISON:  06/02/2018 FINDINGS: Stable right infrahilar opacity. Small bilateral pleural effusions. The heart is normal in size. Kerley B-lines are present at both lung bases laterally. No pneumothorax. Vascularity is within normal limits. IMPRESSION: Stable right infrahilar airspace opacity. Stable bilateral interstitial infiltrates characterized by Charyl Dancer B lines. Continued follow-up until complete resolution is recommended. Electronically Signed   By: Jolaine Click M.D.   On: 06/05/2018 16:14   Ct Angio Chest Pe W/cm &/or Wo Cm  Result Date: 06/05/2018 CLINICAL DATA:  Shortness of breath EXAM: CT ANGIOGRAPHY CHEST WITH CONTRAST TECHNIQUE: Multidetector CT imaging of the chest was performed using the standard protocol during bolus administration of intravenous contrast. Multiplanar CT image reconstructions and MIPs were obtained to evaluate the vascular anatomy. CONTRAST:  OMNIPAQUE IOHEXOL 350 MG/ML SOLN COMPARISON:  Chest radiograph June 05, 2018 FINDINGS: Cardiovascular: There Is no demonstrable pulmonary embolus. There is no thoracic aortic aneurysm. No dissection is appreciable. Note that the contrast bolus in the aorta is not sufficient for dissection assessment. Visualized great vessels appear unremarkable. There is no pericardial effusion or pericardial thickening. Heart is mildly enlarged. There is felt to be a degree of  pulmonary venous hypertension. Mediastinum/Nodes: Thyroid appears unremarkable. There are scattered subcentimeter mediastinal lymph nodes. There is no appreciable thoracic adenopathy by size criteria. A small amount of pericardial fluid is noted in the subcarinal region, likely physiologic. No esophageal lesions are evident. Lungs/Pleura: There are free-flowing pleural effusions bilaterally, larger on the right than on the left. There is a degree of underlying centrilobular emphysema. There is atelectatic change in the lung bases. There is interstitial edema bilaterally. There is no frank airspace consolidation. Upper Abdomen: There is reflux of contrast into the inferior vena cava and hepatic veins. Visualized upper abdominal structures otherwise appear unremarkable except for mild abdominal aortic atherosclerosis. Musculoskeletal: There are no blastic or lytic bone lesions. No evident chest wall lesions. Review of the MIP images confirms the above findings. IMPRESSION: 1. No demonstrable pulmonary embolus. No thoracic aortic aneurysm. No dissection evident; the contrast bolus in the aorta is not sufficient for dissection assessment. 2. Heart mildly enlarged with probable degree of pulmonary venous hypertension. Pleural effusions bilaterally, larger on the right than on the left. There is evident interstitial edema. Question a degree of underlying congestive heart failure. 3. Centrilobular emphysematous change. Areas of atelectatic change. No frank airspace consolidation. 4.  No appreciable adenopathy. 5. Reflux of contrast into the inferior vena cava and hepatic veins, a finding most likely indicative of a degree of increased right heart pressure. Emphysema (ICD10-J43.9). Electronically Signed   By: Bretta Bang III M.D.   On: 06/05/2018 17:39   Vas [place] Lower Extremity Venous (dvt)  Result Date: 06/06/2018  Lower Venous Study Indications: Elevated Ddimer.  Performing Technologist: Chanda Busing RVT   Examination Guidelines: A complete evaluation includes B-mode imaging, spectral Doppler, color Doppler, and power  Doppler as needed of all accessible portions of each vessel. Bilateral testing is considered an integral part of a complete examination. Limited examinations for reoccurring indications may be performed as noted.  +---------+---------------+---------+-----------+----------+-------+  RIGHT     Compressibility Phasicity Spontaneity Properties Summary  +---------+---------------+---------+-----------+----------+-------+  CFV       Full            Yes       Yes                             +---------+---------------+---------+-----------+----------+-------+  SFJ       Full                                                      +---------+---------------+---------+-----------+----------+-------+  FV Prox   Full                                                      +---------+---------------+---------+-----------+----------+-------+  FV Mid    Full                                                      +---------+---------------+---------+-----------+----------+-------+  FV Distal Full                                                      +---------+---------------+---------+-----------+----------+-------+  PFV       Full                                                      +---------+---------------+---------+-----------+----------+-------+  POP       Full            Yes       Yes                             +---------+---------------+---------+-----------+----------+-------+  PTV       Full                                                      +---------+---------------+---------+-----------+----------+-------+  PERO      Full                                                      +---------+---------------+---------+-----------+----------+-------+   +---------+---------------+---------+-----------+----------+-------+  LEFT  Compressibility Phasicity Spontaneity Properties Summary   +---------+---------------+---------+-----------+----------+-------+  CFV       Full            Yes       Yes                             +---------+---------------+---------+-----------+----------+-------+  SFJ       Full                                                      +---------+---------------+---------+-----------+----------+-------+  FV Prox   Full                                                      +---------+---------------+---------+-----------+----------+-------+  FV Mid    Full                                                      +---------+---------------+---------+-----------+----------+-------+  FV Distal Full                                                      +---------+---------------+---------+-----------+----------+-------+  PFV       Full                                                      +---------+---------------+---------+-----------+----------+-------+  POP       Full            Yes       Yes                             +---------+---------------+---------+-----------+----------+-------+  PTV       Full                                                      +---------+---------------+---------+-----------+----------+-------+  PERO      Full                                                      +---------+---------------+---------+-----------+----------+-------+     Summary: Right: There is no evidence of deep vein thrombosis in the lower extremity. No cystic structure found in the popliteal fossa. Left: There is no evidence of deep vein thrombosis in the lower extremity. No cystic structure found in the popliteal fossa.  *  See table(s) above for measurements and observations.    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       For  questions or updates, please contact [facility] Please consult [email or web address] for contact info under     Signed, Corine Shelter, PA-C  06/07/2018 10:21 AM   Patient examined chart reviewed Discussed diagnosis of new onset CHF and Rx with patient Exam with no distress. Telemetry NSR Lungs clear no murmur no edema no rash. TTE reviewed with diffuse hypokinesis EF 20% mild to moderate MR. His mother had CHF and brother father had stents. He denies chest pain MI palpitations or syncope. ECG shows NSR with LVH and no acute changes. Unfortunately he was given lisinopril this am at 10:00 am and we cannot start entresto for 36 hours. Have d/c ACE and also started aldactone with low K and can keep on low dose coreg. Discussed further w/u and need to r/o CAD. May be able to do cardiac CT to r/o CAD and MRI to r/o infiltrative disease. If he does not continue to improve with medical Rx will need cath on Monday or Tuesday. I think he will be fine for outpatient evaluation but unfortunately he will not be able to get entresto till Monday morning   Charlton Haws

## 2018-06-07 NOTE — Progress Notes (Addendum)
PROGRESS NOTE    Austin MurdochWilliam Bryan [name]  XBJ:[id number] DOB: 1965-04-27 DOA: 06/05/2018 PCP: Wynn BankerKoberlein, Junell C, MD   Brief Narrative:  HPI on 06/05/2018 by Dr. John GiovanniVasundhra Rathore Austin [name] is a 53 y.o. 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  Patient was seen by his PCP and found to have an elevated d-dimer of 2.05 and elevated BNP of 544.  He was sent to the ER for further evaluation.  Patient states he has been feeling short of breath since October 2019 and symptoms have been getting progressively worse, especially for the past 1 month.  He feels short of breath both with exertion and at rest.  Sometimes his shortness of breath is accompanied by wheezing.  Symptoms are especially worse at night when he lays down flat to sleep.  He is also having a dry cough.  No weight gain or lower extremity edema.  Denies any history of CHF or lung disease. States he was previously smoking cigarettes but quit a month ago.  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  His last trip was the end of February to [place].  He has stayed at home for the past 1 month.  Denies any fevers, chills, sick contacts, or exposure to any individual with confirmed COVID-19.  Interim history Admitted for respiratory failure, found to Acute systolic CHF (new). Cardiology consulted.  Assessment & Plan   Acute hypoxic respiratory failure secondary to New Systolic CHF -Ongoing for the past several months, however worsening. Patient admits to now seeing a PCP until recently.  -Patient noted to have hypoxia, 85% on room air upon presentation -CTA chest negative for PE. Heart mildly enlarged with probable degree of pulmonary venous hypertension.  Pleural effusions bilaterally,L>R.  Centrilobular emphysematous change. -Chest x-ray  shows stable right infrahilar airspace opacity.  Stable bilateral interstitial infiltrates, curly B-lines. -BNP on admission 544.  D-dimer 2.05 -Echocardiogram: EF 20%, diffuse hypokinesis, inferior akinesis. LV function could not be evaluated.   -Patient placed on IV Lasix 40 mg twice daily -Continue to monitor intake and output, daily weights -Weight down to 4kg; UOP over past 24hrs 2325cc -will start patient low dose on coreg and lisinopril -Cardiology consulted and appreciated  Elevated D-Dimer -CTA chest negative for PE -Lower ext doppler negative for DVT  T wave inversions on EKG -EKG showed T wave inversions V5 and V6.  No prior EKG for comparison -No complaints of chest pain -Troponin currently being cycled and unremarkable  Anxiety -Continue Klonopin  Peptic ulcer disease, GERD -Continue PPI  Hypokalemia -Secondary to diuresis -replace and monitor BMP  DVT Prophylaxis Lovenox  Code Status: Full  Family Communication: None at bedside  Disposition Plan: Admitted.  Pending cardiology consult. Discharge to home likely in a couple of days.  Consultants Cardiology  Procedures  Echocardiogram Lower extremity doppler  Antibiotics   Anti-infectives (From admission, onward)   None      Subjective:   Austin [name] seen and examined today.  Patient feeling better this morning, able to lay flat last night.  Continues to have some shortness of breath with minimal exertion.  Denies current chest pain, abdominal pain, nausea or vomiting, diarrhea constipation, dizziness or headache.  Objective:   Vitals:   06/06/18 1600 06/06/18 1800 06/06/18 2039 06/07/18 0441  BP:  125/90 106/81 113/84  Pulse:  93 98 95  Resp:   18 18  Temp: 97.6 F (36.4 C) 98.7 F (37.1 C) 98.2 F (36.8 C) 97.7 F (36.5 C)  TempSrc: Oral Oral Oral Oral  SpO2:  98% 94% 96%  Weight:      Height:        Intake/Output Summary (Last 24 hours) at 06/07/2018 0915 Last data filed at  06/07/2018 0506 Gross per 24 hour  Intake 900 ml  Output 1825 ml  Net -925 ml   Filed Weights   06/05/18 1627 06/05/18 2236 06/06/18 0500  Weight: 81.9 kg 78.2 kg 77.6 kg   Exam  General: Well developed, well nourished, NAD, appears stated age  HEENT: NCAT, mucous membranes moist.   Neck: Supple  Cardiovascular: S1 S2 auscultated, RRR  Respiratory: Clear to auscultation bilaterally with equal chest rise  Abdomen: Soft, nontender, nondistended, + bowel sounds  Extremities: warm dry without cyanosis clubbing or edema  Neuro: AAOx3, nonfocal  Psych: Pleasant, appropriate mood and affect   Data Reviewed: I have personally reviewed following labs and imaging studies  CBC: Recent Labs  Lab 06/02/18 1329 06/05/18 1631 06/07/18 0425  WBC 8.7 8.7 10.2  NEUTROABS 6.2  --   --   HGB 14.5 13.9 15.0  HCT 42.2 41.3 44.5  MCV 98.3 99.3 99.1  PLT 255.0 260 274   Basic Metabolic Panel: Recent Labs  Lab 06/02/18 1329 06/05/18 1631 06/07/18 0425  NA 140 139 139  K 3.8 3.7 3.2*  CL 105 109 100  CO2 GLUCOSE 87 119* 100*  BUN 18 20 21*  CREATININE 0.67 0.80 0.85  CALCIUM 9.0 8.9 9.0   GFR: Estimated Creatinine Clearance: 114.1 mL/min (by C-G formula based on SCr of 0.85 mg/dL). Liver Function Tests: Recent Labs  Lab 06/02/18 1329  AST 15  ALT 15  ALKPHOS 93  BILITOT 1.0  PROT 6.8  ALBUMIN 4.1   No results for input(s): LIPASE, AMYLASE in the last 168 hours. No results for input(s): AMMONIA in the last 168 hours. Coagulation Profile: No results for input(s): INR, PROTIME in the last 168 hours. Cardiac Enzymes: Recent Labs  Lab 06/05/18 1826 06/05/18 2143 06/06/18 0227 06/06/18 0844  TROPONINI <0.03 0.03* <0.03 <0.03   BNP (last 3 results) Recent Labs    06/05/18 1159  PROBNP 544.0*   HbA1C: No results for input(s): HGBA1C in the last 72 hours. CBG: No results for input(s): GLUCAP in the last 168 hours. Lipid Profile: No results for  input(s): CHOL, HDL, LDLCALC, TRIG, CHOLHDL, LDLDIRECT in the last 72 hours. Thyroid Function Tests: No results for input(s): TSH, T4TOTAL, FREET4, T3FREE, THYROIDAB in the last 72 hours. Anemia Panel: No results for input(s): VITAMINB12, FOLATE, FERRITIN, TIBC, IRON, RETICCTPCT in the last 72 hours. Urine analysis:    Component Value Date/Time   COLORURINE YELLOW 07/04/2010 1515   APPEARANCEUR CLEAR 07/04/2010 1515   LABSPEC 1.031 (H) 07/04/2010 1515   PHURINE 5.5 07/04/2010 1515   GLUCOSEU NEGATIVE 07/04/2010 1515   HGBUR NEGATIVE 07/04/2010 1515   BILIRUBINUR neg 09/11/2012 2009   KETONESUR 40 (A) 07/04/2010 1515   PROTEINUR neg 09/11/2012 2009   PROTEINUR NEGATIVE 07/04/2010 1515   UROBILINOGEN 0.2 09/11/2012 2009   UROBILINOGEN 0.2 07/04/2010 1515   NITRITE neg 09/11/2012 2009   NITRITE NEGATIVE 07/04/2010 1515   LEUKOCYTESUR Negative 09/11/2012 2009   Sepsis Labs: (procalcitonin:4,lacticidven:4)  ) Recent Results (from the past 240 hour(s))  MRSA PCR Screening  Status: None   Collection Time: 06/05/18 11:16 PM  Result Value Ref Range Status   MRSA by PCR NEGATIVE NEGATIVE Final    Comment:        The GeneXpert MRSA Assay (FDA approved for NASAL specimens only), is one component of a comprehensive MRSA colonization surveillance program. It is not intended to diagnose MRSA infection nor to guide or monitor treatment for MRSA infections. Performed at [facility], [address], [place], [place] [address]       Radiology Studies: Dg Chest 2 View  Result Date: 06/05/2018 CLINICAL DATA:  Right lung opacity EXAM: CHEST - 2 VIEW COMPARISON:  06/02/2018 FINDINGS: Stable right infrahilar opacity. Small bilateral pleural effusions. The heart is normal in size. Kerley B-lines are present at both lung bases laterally. No pneumothorax. Vascularity is within normal limits. IMPRESSION: Stable right infrahilar airspace opacity. Stable  bilateral interstitial infiltrates characterized by Charyl Dancer B lines. Continued follow-up until complete resolution is recommended. Electronically Signed   By: Jolaine Click M.D.   On: 06/05/2018 16:14   Ct Angio Chest Pe W/cm &/or Wo Cm  Result Date: 06/05/2018 CLINICAL DATA:  Shortness of breath EXAM: CT ANGIOGRAPHY CHEST WITH CONTRAST TECHNIQUE: Multidetector CT imaging of the chest was performed using the standard protocol during bolus administration of intravenous contrast. Multiplanar CT image reconstructions and MIPs were obtained to evaluate the vascular anatomy. CONTRAST:  OMNIPAQUE IOHEXOL 350 MG/ML SOLN COMPARISON:  Chest radiograph June 05, 2018 FINDINGS: Cardiovascular: There Is no demonstrable pulmonary embolus. There is no thoracic aortic aneurysm. No dissection is appreciable. Note that the contrast bolus in the aorta is not sufficient for dissection assessment. Visualized great vessels appear unremarkable. There is no pericardial effusion or pericardial thickening. Heart is mildly enlarged. There is felt to be a degree of pulmonary venous hypertension. Mediastinum/Nodes: Thyroid appears unremarkable. There are scattered subcentimeter mediastinal lymph nodes. There is no appreciable thoracic adenopathy by size criteria. A small amount of pericardial fluid is noted in the subcarinal region, likely physiologic. No esophageal lesions are evident. Lungs/Pleura: There are free-flowing pleural effusions bilaterally, larger on the right than on the left. There is a degree of underlying centrilobular emphysema. There is atelectatic change in the lung bases. There is interstitial edema bilaterally. There is no frank airspace consolidation. Upper Abdomen: There is reflux of contrast into the inferior vena cava and hepatic veins. Visualized upper abdominal structures otherwise appear unremarkable except for mild abdominal aortic atherosclerosis. Musculoskeletal: There are no blastic or lytic bone  lesions. No evident chest wall lesions. Review of the MIP images confirms the above findings. IMPRESSION: 1. No demonstrable pulmonary embolus. No thoracic aortic aneurysm. No dissection evident; the contrast bolus in the aorta is not sufficient for dissection assessment. 2. Heart mildly enlarged with probable degree of pulmonary venous hypertension. Pleural effusions bilaterally, larger on the right than on the left. There is evident interstitial edema. Question a degree of underlying congestive heart failure. 3. Centrilobular emphysematous change. Areas of atelectatic change. No frank airspace consolidation. 4.  No appreciable adenopathy. 5. Reflux of contrast into the inferior vena cava and hepatic veins, a finding most likely indicative of a degree of increased right heart pressure. Emphysema (ICD10-J43.9). Electronically Signed   By: Bretta Bang III M.D.   On: 06/05/2018 17:39   Vas [place] Lower Extremity Venous (dvt)  Result Date: 06/06/2018  Lower Venous Study Indications: Elevated Ddimer.  Performing Technologist: Chanda Busing RVT  Examination Guidelines: A complete evaluation includes B-mode  imaging, spectral Doppler, color Doppler, and power Doppler as needed of all accessible portions of each vessel. Bilateral testing is considered an integral part of a complete examination. Limited examinations for reoccurring indications may be performed as noted.  +---------+---------------+---------+-----------+----------+-------+  RIGHT     Compressibility Phasicity Spontaneity Properties Summary  +---------+---------------+---------+-----------+----------+-------+  CFV       Full            Yes       Yes                             +---------+---------------+---------+-----------+----------+-------+  SFJ       Full                                                      +---------+---------------+---------+-----------+----------+-------+  FV Prox   Full                                                       +---------+---------------+---------+-----------+----------+-------+  FV Mid    Full                                                      +---------+---------------+---------+-----------+----------+-------+  FV Distal Full                                                      +---------+---------------+---------+-----------+----------+-------+  PFV       Full                                                      +---------+---------------+---------+-----------+----------+-------+  POP       Full            Yes       Yes                             +---------+---------------+---------+-----------+----------+-------+  PTV       Full                                                      +---------+---------------+---------+-----------+----------+-------+  PERO      Full                                                      +---------+---------------+---------+-----------+----------+-------+   +---------+---------------+---------+-----------+----------+-------+  LEFT      Compressibility Phasicity Spontaneity Properties Summary  +---------+---------------+---------+-----------+----------+-------+  CFV       Full            Yes       Yes                             +---------+---------------+---------+-----------+----------+-------+  SFJ       Full                                                      +---------+---------------+---------+-----------+----------+-------+  FV Prox   Full                                                      +---------+---------------+---------+-----------+----------+-------+  FV Mid    Full                                                      +---------+---------------+---------+-----------+----------+-------+  FV Distal Full                                                      +---------+---------------+---------+-----------+----------+-------+  PFV       Full                                                      +---------+---------------+---------+-----------+----------+-------+  POP       Full             Yes       Yes                             +---------+---------------+---------+-----------+----------+-------+  PTV       Full                                                      +---------+---------------+---------+-----------+----------+-------+  PERO      Full                                                      +---------+---------------+---------+-----------+----------+-------+     Summary: Right: There is no evidence of deep vein thrombosis in the lower extremity. No cystic structure found in the popliteal fossa. Left: There is no evidence of deep vein thrombosis in the lower extremity. No cystic structure  found in the popliteal fossa.  *See table(s) above for measurements and observations.    Preliminary      Scheduled Meds:  Chlorhexidine Gluconate Cloth  6 each Topical Daily   enoxaparin (LOVENOX) injection  40 mg Subcutaneous QHS   furosemide  40 mg Intravenous Q12H   mouth rinse  15 mL Mouth Rinse BID   pantoprazole  40 mg Oral Daily   potassium chloride  40 mEq Oral Q4H   Continuous Infusions:   LOS: 1 day   Time Spent in minutes   30 minutes  Eyana Stolze D.O. on 06/07/2018 at 9:15 AM  Between 7am to 7pm - Please see pager noted on [email or web address]  After 7pm go to [email or web address]  And look for the night coverage person covering for me after hours  [facility] Office  [phone]

## 2018-06-08 DIAGNOSIS — J9601 Acute respiratory failure with hypoxia: Secondary | ICD-10-CM

## 2018-06-08 LAB — BASIC METABOLIC PANEL
Anion gap: 12 (ref 5–15)
BUN: 26 mg/dL — ABNORMAL HIGH (ref 6–20)
CO2: 27 mmol/L (ref 22–32)
Calcium: 9.1 mg/dL (ref 8.9–10.3)
Chloride: 98 mmol/L (ref 98–111)
Creatinine, Ser: 1.1 mg/dL (ref 0.61–1.24)
GFR calc Af Amer: >60 mL/min (ref >60)
GFR calc non Af Amer: >60 mL/min (ref >60)
Glucose, Bld: 102 mg/dL — ABNORMAL HIGH (ref 70–99)
Potassium: 3.8 mmol/L (ref 3.5–5.1)
Sodium: 137 mmol/L (ref 135–145)

## 2018-06-08 MED ORDER — FUROSEMIDE 40 MG PO TABS
40.0000 mg | ORAL_TABLET | Freq: Every day | ORAL | Status: DC
  Administered 2018-06-08: 40 mg via ORAL
  Filled 2018-06-08: qty 1

## 2018-06-08 NOTE — Progress Notes (Signed)
Progress Note  Patient Name: Austin [name] Date of Encounter: 06/08/2018  Primary Cardiologist: Charlton Haws, MD    Subjective   No PND/Orthopnea breathing better good diuresis BP soft but no dizziness when ambulating sats normal   Inpatient Medications    Scheduled Meds:  carvedilol  3.125 mg Oral BID WC   Chlorhexidine Gluconate Cloth  6 each Topical Daily   enoxaparin (LOVENOX) injection  40 mg Subcutaneous QHS   mouth rinse  15 mL Mouth Rinse BID   pantoprazole  40 mg Oral Daily   [START ON 06/09/2018] sacubitril-valsartan  1 tablet Oral BID   spironolactone  12.5 mg Oral Daily   Continuous Infusions:  PRN Meds: acetaminophen, albuterol, clonazePAM   Vital Signs    Vitals:   06/07/18 1529 06/07/18 2020 06/08/18 0500 06/08/18 0520  BP:  91/72  94/75  Pulse:  90  78  Resp:  16  18  Temp:  98.4 F (36.9 C)  98 F (36.7 C)  TempSrc:  Oral  Oral  SpO2: 93% 92%  93%  Weight:   78.3 kg   Height:        Intake/Output Summary (Last 24 hours) at 06/08/2018 1142 Last data filed at 06/08/2018 0900 Gross per 24 hour  Intake 240 ml  Output 1675 ml  Net -1435 ml   Last 3 Weights 06/08/2018 06/06/2018 06/05/2018  Weight (lbs) 172 lb 11.2 oz 171 lb 1.2 oz 172 lb 6.4 oz  Weight (kg) 78.336 kg 77.6 kg 78.2 kg      Telemetry    NSR HR lower no VT  - Personally Reviewed  ECG    SR ICVD  - Personally Reviewed  Physical Exam   GEN: No acute distress.   Neck: No JVD Cardiac: RRR, no murmurs, rubs, or gallops.  Respiratory: Clear to auscultation bilaterally. GI: Soft, nontender, non-distended  MS: No edema; No deformity. Neuro:  Nonfocal  Psych: Normal affect   Labs    Chemistry Recent Labs  Lab 06/02/18 1329 06/05/18 1631 06/07/18 0425 06/08/18 0438  NA 140 139 139 137  K 3.8 3.7 3.2* 3.8  CL 105 109 100 98  CO2 GLUCOSE 87 119* 100* 102*  BUN 18 20 21* 26*  CREATININE 0.67 0.80 0.85 1.10  CALCIUM 9.0 8.9 9.0 9.1  PROT 6.8   --   --   --   ALBUMIN 4.1  --   --   --   AST 15  --   --   --   ALT 15  --   --   --   ALKPHOS 93  --   --   --   BILITOT 1.0  --   --   --   GFRNONAA  --  >60 >60 >60  GFRAA  --  >60 >60 >60  ANIONGAP  --  Hematology Recent Labs  Lab 06/02/18 1329 06/05/18 1631 06/07/18 0425  WBC 8.7 8.7 10.2  RBC 4.29 4.16* 4.49  HGB 14.5 13.9 15.0  HCT 42.2 41.3 44.5  MCV 98.3 99.3 99.1  MCH  --  33.4 33.4  MCHC 34.3 33.7 33.7  RDW 13.2 13.0 12.8  PLT 255.0 260 274    Cardiac Enzymes Recent Labs  Lab 06/05/18 1826 06/05/18 2143 06/06/18 0227 06/06/18 0844  TROPONINI <0.03 0.03* <0.03 <0.03   No results for input(s): TROPIPOC in the last 168 hours.   BNP Recent  Labs  Lab 06/05/18 1159  PROBNP 544.0*     DDimer  Recent Labs  Lab 06/05/18 1206  DDIMER 2.05*     Radiology    Vas [place] Lower Extremity Venous (dvt)  Result Date: 06/07/2018  Lower Venous Study Indications: Elevated Ddimer.  Performing Technologist: Chanda Busing RVT  Examination Guidelines: A complete evaluation includes B-mode imaging, spectral Doppler, color Doppler, and power Doppler as needed of all accessible portions of each vessel. Bilateral testing is considered an integral part of a complete examination. Limited examinations for reoccurring indications may be performed as noted.  +---------+---------------+---------+-----------+----------+-------+  RIGHT     Compressibility Phasicity Spontaneity Properties Summary  +---------+---------------+---------+-----------+----------+-------+  CFV       Full            Yes       Yes                             +---------+---------------+---------+-----------+----------+-------+  SFJ       Full                                                      +---------+---------------+---------+-----------+----------+-------+  FV Prox   Full                                                      +---------+---------------+---------+-----------+----------+-------+  FV  Mid    Full                                                      +---------+---------------+---------+-----------+----------+-------+  FV Distal Full                                                      +---------+---------------+---------+-----------+----------+-------+  PFV       Full                                                      +---------+---------------+---------+-----------+----------+-------+  POP       Full            Yes       Yes                             +---------+---------------+---------+-----------+----------+-------+  PTV       Full                                                      +---------+---------------+---------+-----------+----------+-------+  PERO  Full                                                      +---------+---------------+---------+-----------+----------+-------+   +---------+---------------+---------+-----------+----------+-------+  LEFT      Compressibility Phasicity Spontaneity Properties Summary  +---------+---------------+---------+-----------+----------+-------+  CFV       Full            Yes       Yes                             +---------+---------------+---------+-----------+----------+-------+  SFJ       Full                                                      +---------+---------------+---------+-----------+----------+-------+  FV Prox   Full                                                      +---------+---------------+---------+-----------+----------+-------+  FV Mid    Full                                                      +---------+---------------+---------+-----------+----------+-------+  FV Distal Full                                                      +---------+---------------+---------+-----------+----------+-------+  PFV       Full                                                      +---------+---------------+---------+-----------+----------+-------+  POP       Full            Yes       Yes                              +---------+---------------+---------+-----------+----------+-------+  PTV       Full                                                      +---------+---------------+---------+-----------+----------+-------+  PERO      Full                                                      +---------+---------------+---------+-----------+----------+-------+  Summary: Right: There is no evidence of deep vein thrombosis in the lower extremity. No cystic structure found in the popliteal fossa. Left: There is no evidence of deep vein thrombosis in the lower extremity. No cystic structure found in the popliteal fossa.  *See table(s) above for measurements and observations. Electronically signed by Lemar Livings MD on 06/07/2018 at 10:26:29 AM.    Final     Cardiac Studies   TTE:  EF 20% diffuse hypokinesis RV moderately reduced function mild to moderate MR  Patient Profile     53 y.o. male with no onset systolic CHF. No chest pain or history of CAD. Family history of CHF CXR CHF, BNP 554 ECG IVCD.   Assessment & Plan    CHF:  Improved will stop lasix and continue aldactone Continue coreg HR much better. Got a dose of lisinopril Saturday so first dose of entresto will be tomorrow am. If tolerates with no hypotension or dizziness can probably be d/c tomorrow afternoon. Will need testing to r/o CAD and infiltrative DCM/ Likely cardic CT in 4 weeks due to COVID19 restrictions and cardiac MRI in 3 months when due to reassess EF on medical Rx. He works in Airline pilot for [employer] Rx ok to continue No driving restrictions Denies excess ETOH  PUD:  History of bleeding ulcers Hct stable on protonix       For questions or updates, please contact [facility] Please consult [email or web address] for contact info under        Signed, Charlton Haws, MD  06/08/2018, 11:42 AM

## 2018-06-08 NOTE — Progress Notes (Signed)
PROGRESS NOTE    Austin [name]  UEA:[id number] DOB: Jul 29, 1965 DOA: 06/05/2018 PCP: Wynn Banker, MD   Brief Narrative:  HPI on 06/05/2018 by Dr. John Giovanni Kyon [name] is a 53 y.o. 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  Patient was seen by his PCP and found to have an elevated d-dimer of 2.05 and elevated BNP of 544.  He was sent to the ER for further evaluation.  Patient states he has been feeling short of breath since October 2019 and symptoms have been getting progressively worse, especially for the past 1 month.  He feels short of breath both with exertion and at rest.  Sometimes his shortness of breath is accompanied by wheezing.  Symptoms are especially worse at night when he lays down flat to sleep.  He is also having a dry cough.  No weight gain or lower extremity edema.  Denies any history of CHF or lung disease. States he was previously smoking cigarettes but quit a month ago.  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  His last trip was the end of February to [place].  He has stayed at home for the past 1 month.  Denies any fevers, chills, sick contacts, or exposure to any individual with confirmed COVID-19.  Interim history Admitted for respiratory failure, found to Acute systolic CHF (new). Cardiology consulted.  Assessment & Plan   Acute hypoxic respiratory failure secondary to New Systolic CHF -Ongoing for the past several months, however worsening. Patient admits to now seeing a PCP until recently.  -Patient noted to have hypoxia, 85% on room air upon presentation -CTA chest negative for PE. Heart mildly enlarged with probable degree of pulmonary venous hypertension.  Pleural effusions bilaterally,L>R.  Centrilobular emphysematous change. -Chest x-ray  shows stable right infrahilar airspace opacity.  Stable bilateral interstitial infiltrates, curly B-lines. -BNP on admission 544.  D-dimer 2.05 -Echocardiogram: EF 20%, diffuse hypokinesis, inferior akinesis. LV function could not be evaluated.   -Patient placed on IV Lasix 40 mg twice daily- will transition to oral today -Continue to monitor intake and output, daily weights -Weight down 4kg since admission -Continue coreg 3.125mg  BID -Cardiology consulted and appreciated- wanting to start patient on Entresto on 06/09/2018 and started patient on spironolactone 12.5mg  daily. May order Cardiac CT to rule out CAD and MRI to r/o infiltrative disease -weaned off of supplemental oxygen- able to ambulate and maintain saturations around 93%  Elevated D-Dimer -CTA chest negative for PE -Lower ext doppler negative for DVT  T wave inversions on EKG -EKG showed T wave inversions V5 and V6.  No prior EKG for comparison -No complaints of chest pain -Troponin currently being cycled and unremarkable  Anxiety -Continue Klonopin  Peptic ulcer disease, GERD -Continue PPI  Hypokalemia -Secondary to diuresis -started on aldactone by cardiology -potassium improved, continue to monitor BMP  DVT Prophylaxis Lovenox  Code Status: Full  Family Communication: None at bedside  Disposition Plan: Admitted.  Possible discharge to home in 1-2 days  Consultants Cardiology  Procedures  Echocardiogram Lower extremity doppler  Antibiotics   Anti-infectives (From admission, onward)   None      Subjective:   Austin [name] seen and examined today.  Patient feeling better this morning.  States his breathing is much better and he is able to walk around the  unit yesterday without oxygen.  Denies current chest pain, shortness of breath, abdominal pain, nausea or vomiting, diarrhea constipation, dizziness or headache. Objective:   Vitals:   06/07/18 1529 06/07/18 2020 06/08/18 0500 06/08/18 0520  BP:   91/72  94/75  Pulse:  90  78  Resp:  16  18  Temp:  98.4 F (36.9 C)  98 F (36.7 C)  TempSrc:  Oral  Oral  SpO2: 93% 92%  93%  Weight:   78.3 kg   Height:        Intake/Output Summary (Last 24 hours) at 06/08/2018 1002 Last data filed at 06/08/2018 0900 Gross per 24 hour  Intake 240 ml  Output 2000 ml  Net -1760 ml   Filed Weights   06/05/18 2236 06/06/18 0500 06/08/18 0500  Weight: 78.2 kg 77.6 kg 78.3 kg   Exam  General: Well developed, well nourished, NAD, appears stated age  HEENT: NCAT, mucous membranes moist.   Neck: Supple  Cardiovascular: S1 S2 auscultated, RRR, no murmur  Respiratory: Clear to auscultation bilaterally with equal chest rise  Abdomen: Soft, nontender, nondistended, + bowel sounds  Extremities: warm dry without cyanosis clubbing or edema  Neuro: AAOx3, nonfocal  Psych: Pleasant, appropriate mood and affect  Data Reviewed: I have personally reviewed following labs and imaging studies  CBC: Recent Labs  Lab 06/02/18 1329 06/05/18 1631 06/07/18 0425  WBC 8.7 8.7 10.2  NEUTROABS 6.2  --   --   HGB 14.5 13.9 15.0  HCT 42.2 41.3 44.5  MCV 98.3 99.3 99.1  PLT 255.0 260 274   Basic Metabolic Panel: Recent Labs  Lab 06/02/18 1329 06/05/18 1631 06/07/18 0425 06/08/18 0438  NA 140 139 139 137  K 3.8 3.7 3.2* 3.8  CL 105 109 100 98  CO2 25 22 28 27   GLUCOSE 87 119* 100* 102*  BUN 18 20 21* 26*  CREATININE 0.67 0.80 0.85 1.10  CALCIUM 9.0 8.9 9.0 9.1   GFR: Estimated Creatinine Clearance: 88.2 mL/min (by C-G formula based on SCr of 1.1 mg/dL). Liver Function Tests: Recent Labs  Lab 06/02/18 1329  AST 15  ALT 15  ALKPHOS 93  BILITOT 1.0  PROT 6.8  ALBUMIN 4.1   No results for input(s): LIPASE, AMYLASE in the last 168 hours. No results for input(s): AMMONIA in the last 168 hours. Coagulation Profile: No results for input(s): INR, PROTIME in the last 168 hours. Cardiac Enzymes: Recent Labs  Lab 06/05/18 1826  06/05/18 2143 06/06/18 0227 06/06/18 0844  TROPONINI <0.03 0.03* <0.03 <0.03   BNP (last 3 results) Recent Labs    06/05/18 1159  PROBNP 544.0*   HbA1C: No results for input(s): HGBA1C in the last 72 hours. CBG: No results for input(s): GLUCAP in the last 168 hours. Lipid Profile: No results for input(s): CHOL, HDL, LDLCALC, TRIG, CHOLHDL, LDLDIRECT in the last 72 hours. Thyroid Function Tests: No results for input(s): TSH, T4TOTAL, FREET4, T3FREE, THYROIDAB in the last 72 hours. Anemia Panel: No results for input(s): VITAMINB12, FOLATE, FERRITIN, TIBC, IRON, RETICCTPCT in the last 72 hours. Urine analysis:    Component Value Date/Time   COLORURINE YELLOW 07/04/2010 1515   APPEARANCEUR CLEAR 07/04/2010 1515   LABSPEC 1.031 (H) 07/04/2010 1515   PHURINE 5.5 07/04/2010 1515   GLUCOSEU NEGATIVE 07/04/2010 1515   HGBUR NEGATIVE 07/04/2010 1515   BILIRUBINUR neg 09/11/2012 2009   KETONESUR 40 (A) 07/04/2010 1515   PROTEINUR neg 09/11/2012 2009   PROTEINUR NEGATIVE 07/04/2010 1515  UROBILINOGEN 0.2 09/11/2012 2009   UROBILINOGEN 0.2 07/04/2010 1515   NITRITE neg 09/11/2012 2009   NITRITE NEGATIVE 07/04/2010 1515   LEUKOCYTESUR Negative 09/11/2012 2009   Sepsis Labs: @LABRCNTIP (procalcitonin:4,lacticidven:4)  ) Recent Results (from the past 240 hour(s))  MRSA PCR Screening     Status: None   Collection Time: 06/05/18 11:16 PM  Result Value Ref Range Status   MRSA by PCR NEGATIVE NEGATIVE Final    Comment:        The GeneXpert MRSA Assay (FDA approved for NASAL specimens only), is one component of a comprehensive MRSA colonization surveillance program. It is not intended to diagnose MRSA infection nor to guide or monitor treatment for MRSA infections. Performed at [facility], [address] [place], [place] [id number]       Radiology Studies: Vas Koreas Lower Extremity Venous (dvt)  Result Date: 06/07/2018  Lower Venous Study Indications:  Elevated Ddimer.  Performing Technologist: Chanda BusingGregory Collins RVT  Examination Guidelines: A complete evaluation includes B-mode imaging, spectral Doppler, color Doppler, and power Doppler as needed of all accessible portions of each vessel. Bilateral testing is considered an integral part of a complete examination. Limited examinations for reoccurring indications may be performed as noted.  +---------+---------------+---------+-----------+----------+-------+  RIGHT     Compressibility Phasicity Spontaneity Properties Summary  +---------+---------------+---------+-----------+----------+-------+  CFV       Full            Yes       Yes                             +---------+---------------+---------+-----------+----------+-------+  SFJ       Full                                                      +---------+---------------+---------+-----------+----------+-------+  FV Prox   Full                                                      +---------+---------------+---------+-----------+----------+-------+  FV Mid    Full                                                      +---------+---------------+---------+-----------+----------+-------+  FV Distal Full                                                      +---------+---------------+---------+-----------+----------+-------+  PFV       Full                                                      +---------+---------------+---------+-----------+----------+-------+  POP       Full  Yes       Yes                             +---------+---------------+---------+-----------+----------+-------+  PTV       Full                                                      +---------+---------------+---------+-----------+----------+-------+  PERO      Full                                                      +---------+---------------+---------+-----------+----------+-------+   +---------+---------------+---------+-----------+----------+-------+  LEFT       Compressibility Phasicity Spontaneity Properties Summary  +---------+---------------+---------+-----------+----------+-------+  CFV       Full            Yes       Yes                             +---------+---------------+---------+-----------+----------+-------+  SFJ       Full                                                      +---------+---------------+---------+-----------+----------+-------+  FV Prox   Full                                                      +---------+---------------+---------+-----------+----------+-------+  FV Mid    Full                                                      +---------+---------------+---------+-----------+----------+-------+  FV Distal Full                                                      +---------+---------------+---------+-----------+----------+-------+  PFV       Full                                                      +---------+---------------+---------+-----------+----------+-------+  POP       Full            Yes       Yes                             +---------+---------------+---------+-----------+----------+-------+  PTV       Full                                                      +---------+---------------+---------+-----------+----------+-------+  PERO      Full                                                      +---------+---------------+---------+-----------+----------+-------+     Summary: Right: There is no evidence of deep vein thrombosis in the lower extremity. No cystic structure found in the popliteal fossa. Left: There is no evidence of deep vein thrombosis in the lower extremity. No cystic structure found in the popliteal fossa.  *See table(s) above for measurements and observations. Electronically signed by Lemar Livings MD on 06/07/2018 at 10:26:29 AM.    Final      Scheduled Meds:  carvedilol  3.125 mg Oral BID WC   Chlorhexidine Gluconate Cloth  6 each Topical Daily   enoxaparin (LOVENOX) injection  40 mg Subcutaneous QHS    furosemide  40 mg Intravenous Q12H   mouth rinse  15 mL Mouth Rinse BID   pantoprazole  40 mg Oral Daily   [START ON 06/09/2018] sacubitril-valsartan  1 tablet Oral BID   spironolactone  12.5 mg Oral Daily   Continuous Infusions:   LOS: 2 days   Time Spent in minutes   30 minutes  Lawrnce Reyez D.O. on 06/08/2018 at 10:02 AM  Between 7am to 7pm - Please see pager noted on [email or web address]  After 7pm go to [email or web address]  And look for the night coverage person covering for me after hours  [facility] Office  [phone]

## 2018-06-09 ENCOUNTER — Telehealth: Payer: Self-pay | Admitting: Student

## 2018-06-09 ENCOUNTER — Other Ambulatory Visit: Payer: Self-pay | Admitting: Student

## 2018-06-09 DIAGNOSIS — I5021 Acute systolic (congestive) heart failure: Secondary | ICD-10-CM

## 2018-06-09 DIAGNOSIS — F17201 Nicotine dependence, unspecified, in remission: Secondary | ICD-10-CM

## 2018-06-09 DIAGNOSIS — G610 Guillain-Barre syndrome: Secondary | ICD-10-CM

## 2018-06-09 DIAGNOSIS — I110 Hypertensive heart disease with heart failure: Principal | ICD-10-CM

## 2018-06-09 LAB — BASIC METABOLIC PANEL
Anion gap: 10 (ref 5–15)
BUN: 23 mg/dL — ABNORMAL HIGH (ref 6–20)
CO2: 26 mmol/L (ref 22–32)
Calcium: 9.2 mg/dL (ref 8.9–10.3)
Chloride: 99 mmol/L (ref 98–111)
Creatinine, Ser: 0.84 mg/dL (ref 0.61–1.24)
GFR calc Af Amer: >60 mL/min (ref >60)
GFR calc non Af Amer: >60 mL/min (ref >60)
Glucose, Bld: 113 mg/dL — ABNORMAL HIGH (ref 70–99)
Potassium: 4 mmol/L (ref 3.5–5.1)
Sodium: 135 mmol/L (ref 135–145)

## 2018-06-09 MED ORDER — SACUBITRIL-VALSARTAN 24-26 MG PO TABS: 1.0000 | ORAL_TABLET | Freq: Two times a day (BID) | ORAL | 0 refills | Status: DC

## 2018-06-09 MED ORDER — CARVEDILOL 3.125 MG PO TABS: 3.1250 mg | ORAL_TABLET | Freq: Two times a day (BID) | ORAL | 0 refills | Status: DC

## 2018-06-09 MED ORDER — SPIRONOLACTONE 25 MG PO TABS: 12.5000 mg | ORAL_TABLET | Freq: Every day | ORAL | 0 refills | Status: DC

## 2018-06-09 NOTE — Discharge Instructions (Signed)
YOUR CARDIOLOGY TEAM HAS ARRANGED FOR AN E-VISIT FOR YOUR APPOINTMENT - PLEASE REVIEW IMPORTANT INFORMATION BELOW SEVERAL DAYS PRIOR TO YOUR APPOINTMENT  Due to the recent COVID-19 pandemic, we are transitioning in-person office visits to tele-medicine visits in an effort to decrease unnecessary exposure to our patients, their families, and staff. These visits are billed to your insurance just like a normal visit is. We also encourage you to sign up for MyChart if you have not already done so. You will need a smartphone if possible. For patients that do not have this, we can still complete the visit using a regular telephone but do prefer a smartphone to enable video when possible. You may have a family member that lives with you that can help. If possible, we also ask that you have a blood pressure cuff and scale at home to measure your blood pressure, heart rate and weight prior to your scheduled appointment. Patients with clinical needs that need an in-person evaluation and testing will still be able to come to the office if absolutely necessary. If you have any questions, feel free to call our office.     YOUR PROVIDER WILL BE USING ONE OF THE FOLLOWING PLATFORM TO COMPLETE YOUR VISIT:  WebEx/MyChart/Doximity/Doxy.Me   IF USING WEBEX - How to Download the WebEx App to Your SmartPhone  - If Apple device, go to [employer] Store and type in WebEx in the search bar. Download [employer] Meetings, the blue/green circle. If Android, go to [employer] Play Store and type in [employer]Ex in the search bar. The app is free but as with any other app download, your phone may require you to verify saved payment information or Apple/Android password.  - You do NOT have to create an account. - On the day of the visit, our staff will walk you through joining the meeting with the meeting number/password.   IF USING MYCHART - How to Download the MyChart App to Your SmartPhone   - If Apple, go to [employer] Store and type in MyChart in  the search bar and download the app. If Android, ask patient to go to [employer] Play Store and type in [facility] in the search bar and download the app. The app is free but as with any other app downloads, your phone may require you to verify saved payment information or Apple/Android password.  - You will need to then log into the app with your MyChart username and password, and select [facility] as your healthcare provider to link the account. When it is time for your visit, go to the MyChart app, find appointments, and click Begin Video Visit. Be sure to Select Allow for your device to access the Microphone and Camera for your visit. You will then be connected, and your provider will be with you shortly.  **If you have any issues connecting or need assistance, please contact MyChart service desk [phone])**  **If using a computer, in order to ensure the best quality for your visit, you will need to use either of the following Internet Browsers: [employer] Edge, or Google Chrome**   IF USING DOXIMITY or DOXY.ME - The staff will give you instructions on receiving your link to join the meeting the day of your visit.      2-3 DAYS BEFORE YOUR APPOINTMENT  You will receive a telephone call from one of our [facility] team members - your caller ID may say "Unknown caller." If this is a video visit, we will walk you through  how to set up your device to be able to complete the visit. We will remind you check your blood pressure, heart rate and weight prior to your scheduled appointment. If you have an Apple Watch or Kardia, please upload any pertinent ECG strips the day before or morning of your appointment to MyChart. Our staff will also make sure you have reviewed the consent and agree to move forward with your scheduled tele-health visit.     THE DAY OF YOUR APPOINTMENT  Approximately 15 minutes prior to your scheduled appointment, you will receive a telephone call from one of [facility]  team - your caller ID may say "Unknown caller."  Our staff will confirm medications, vital signs for the day and any symptoms you may be experiencing. Please have this information available prior to the time of visit start. It may also be helpful for you to have a pad of paper and pen handy for any instructions given during your visit. They will also walk you through joining the smartphone meeting if this is a video visit.    CONSENT FOR TELE-HEALTH VISIT - PLEASE REVIEW  I hereby voluntarily request, consent and authorize [facility] and its employed or contracted physicians, physician assistants, nurse practitioners or other licensed health care professionals (the Practitioner), to provide me with telemedicine health care services (the Services") as deemed necessary by the treating Practitioner. I acknowledge and consent to receive the Services by the Practitioner via telemedicine. I understand that the telemedicine visit will involve communicating with the Practitioner through live audiovisual communication technology and the disclosure of certain medical information by electronic transmission. I acknowledge that I have been given the opportunity to request an in-person assessment or other available alternative prior to the telemedicine visit and am voluntarily participating in the telemedicine visit.  I understand that I have the right to withhold or withdraw my consent to the use of telemedicine in the course of my care at any time, without affecting my right to future care or treatment, and that the Practitioner or I may terminate the telemedicine visit at any time. I understand that I have the right to inspect all information obtained and/or recorded in the course of the telemedicine visit and may receive copies of available information for a reasonable fee.  I understand that some of the potential risks of receiving the Services via telemedicine include:   Delay or interruption in medical  evaluation due to technological equipment failure or disruption;  Information transmitted may not be sufficient (e.g. poor resolution of images) to allow for appropriate medical decision making by the Practitioner; and/or   In rare instances, security protocols could fail, causing a breach of personal health information.  Furthermore, I acknowledge that it is my responsibility to provide information about my medical history, conditions and care that is complete and accurate to the best of my ability. I acknowledge that Practitioner's advice, recommendations, and/or decision may be based on factors not within their control, such as incomplete or inaccurate data provided by me or distortions of diagnostic images or specimens that may result from electronic transmissions. I understand that the practice of medicine is not an exact science and that Practitioner makes no warranties or guarantees regarding treatment outcomes. I acknowledge that I will receive a copy of this consent concurrently upon execution via email to the email address I last provided but may also request a printed copy by calling the office of [facility].    I understand that my insurance will  be billed for this visit.   I have read or had this consent read to me.  I understand the contents of this consent, which adequately explains the benefits and risks of the Services being provided via telemedicine.   I have been provided ample opportunity to ask questions regarding this consent and the Services and have had my questions answered to my satisfaction.  I give my informed consent for the services to be provided through the use of telemedicine in my medical care  By participating in this telemedicine visit I agree to the above.  _______________________________________________________________________  Heart Failure  Heart failure means your heart has trouble pumping blood. This makes it hard for your body to work well. Heart  failure is usually a long-term (chronic) condition. You must take good care of yourself and follow your treatment plan from your doctor. Follow these instructions at home: Medicines  Take over-the-counter and prescription medicines only as told by your doctor. ? Do not stop taking your medicine unless your doctor told you to do that. ? Do not skip any doses. ? Refill your prescriptions before you run out of medicine. You need your medicines every day. Eating and drinking   Eat heart-healthy foods. Talk with a diet and nutrition specialist (dietitian) to make an eating plan.  Choose foods that: ? Have no trans fat. ? Are low in saturated fat and cholesterol.  Choose healthy foods, like: ? Fresh or frozen fruits and vegetables. ? Fish. ? Low-fat (lean) meats. ? Legumes (like beans, peas, and lentils). ? Fat-free or low-fat dairy products. ? Whole-grain foods. ? High-fiber foods.  Limit salt (sodium) if told by your doctor. Ask your nutrition specialist to recommend heart-healthy seasonings.  Cook in healthy ways instead of frying. Healthy ways of cooking include: ? Roasting. ? Grilling. ? Broiling. ? Baking. ? Poaching. ? Steaming. ? Stir-frying.  Limit how much fluid you drink, if told by your doctor. Lifestyle  Do not smoke or use chewing tobacco. Do not use nicotine gum or patches before talking to your doctor.  Limit alcohol intake to no more than 1 drink a day for non-pregnant women and 2 drinks a day for men. One drink equals 12 oz of beer, 5 oz of wine, or 1 oz of hard liquor. ? Tell your doctor if you drink alcohol many times a week. ? Talk with your doctor about whether any alcohol is safe for you. ? You should stop drinking alcohol: ? If your heart has been damaged by alcohol. ? You have very bad heart failure.  Do not use illegal drugs.  Lose weight if told by your doctor.  Do moderate physical activity if told by your doctor. Ask your doctor what  activities are safe for you if: ? You are of older age (elderly). ? You have very bad heart failure. Keep track of important information  Weigh yourself every day. ? Weigh yourself every morning after you pee (urinate) and before breakfast. ? Wear the same amount of clothing each time. ? Write down your daily weight. Give your record to your doctor.  Check and write down your blood pressure as told by your doctor.  Check your pulse as told by your doctor. Dealing with heat and cold  If the weather is very hot: ? Avoid activity that takes a lot of energy. ? Use air conditioning or fans, or find a cooler place. ? Avoid caffeine. ? Avoid alcohol. ? Wear clothing that is loose-fitting, lightweight, and  light-colored.  If the weather is very cold: ? Avoid activity that takes a lot of energy. ? Layer your clothes. ? Wear mittens or gloves, a hat, and a scarf when you go outside. ? Avoid alcohol. General instructions  Manage other conditions that you have as told by your doctor.  Learn to manage stress. If you need help, ask your doctor.  Plan rest periods for when you get tired.  Get education and support as needed.  Get rehab (rehabilitation) to help you stay independent and to help with everyday tasks.  Stay up to date with shots (immunizations), especially pneumococcal and flu (influenza) shots.  Keep all follow-up visits as told by your doctor. This is important. Contact a doctor if:  You gain weight quickly.  You are more short of breath than normal.  You cannot do your normal activities.  You tire easily.  You cough more than normal, especially with activity.  You have any or more puffiness (swelling) in areas such as your hands, feet, ankles, or belly (abdomen).  You cannot sleep because it is hard to breathe.  You feel like your heart is beating fast (palpitations).  You get dizzy or light-headed when you stand up. Get help right away if:  You have  trouble breathing.  You or someone else notices a change in your awareness. This could be trouble staying awake or trouble concentrating.  You have chest pain or discomfort.  You pass out (faint). Summary  Heart failure means your heart has trouble pumping blood.  Make sure you refill your prescriptions before you run out of medicine. You need your medicines every day.  Keep records of your weight and blood pressure to give to your doctor.  Contact a doctor if you gain weight quickly. This information is not intended to replace advice given to you by your health care provider. Make sure you discuss any questions you have with your health care provider. Document Released: 11/15/2007 Document Revised: 10/30/2017 Document Reviewed: 02/28/2016 Elsevier Interactive Patient Education  2019 [employer].

## 2018-06-09 NOTE — Progress Notes (Signed)
Patients BP 100/57, pulse 88.  Patient has been ambulating halls independently.  Denies any dizziness, feeling light headed.  Tolerated well.

## 2018-06-09 NOTE — Progress Notes (Signed)
Progress Note  Due to the COVID-19 pandemic, this visit was completed with telemedicine (audio/video) technology to reduce patient and provider exposure as well as to preserve personal protective equipment.   Patient Name: Austin [name] Date of Encounter: 06/09/2018  Primary Cardiologist: Charlton Haws, MD   Subjective   Austin [name] is a 53 year old gentleman who was admitted to the hospital several days ago with progressive shortness of breath for the past month or so.  He was found to have acute systolic congestive heart failure.  He has a history of hypertension and also a history of Gilliam Barr syndrome.  He has been started on carvedilol, Entresto, Aldactone.  He seems to be doing well.  He has  diuresed nicely.  I talked on the telephone with him today.  He is feeling back to normal.  He is no longer having any shortness of breath.  He denies having any episodes of angina.  He has a family history of cardiac disease.  His mother had congestive heart failure and also his brother had coronary artery disease which required stenting.  He is a previous smoker but stopped a month or 2 ago.  Inpatient Medications    Scheduled Meds: . carvedilol  3.125 mg Oral BID WC  . Chlorhexidine Gluconate Cloth  6 each Topical Daily  . enoxaparin (LOVENOX) injection  40 mg Subcutaneous QHS  . mouth rinse  15 mL Mouth Rinse BID  . pantoprazole  40 mg Oral Daily  . sacubitril-valsartan  1 tablet Oral BID  . spironolactone  12.5 mg Oral Daily   Continuous Infusions:  PRN Meds: acetaminophen, albuterol, clonazePAM   Vital Signs    Vitals:   06/09/18 0527 06/09/18 0652 06/09/18 0752 06/09/18 1028  BP: 103/82  111/84 (!) 100/57  Pulse: 87  96 88  Resp: 15     Temp: 97.6 F (36.4 C)     TempSrc: Oral     SpO2: 95%  94%   Weight:  77.8 kg    Height:        Intake/Output Summary (Last 24 hours) at 06/09/2018 1028 Last data filed at 06/09/2018 0757 Gross per 24 hour  Intake 714 ml   Output 1195 ml  Net -481 ml   Last 3 Weights 06/09/2018 06/08/2018 06/06/2018  Weight (lbs) 171 lb 9.6 oz 172 lb 11.2 oz 171 lb 1.2 oz  Weight (kg) 77.837 kg 78.336 kg 77.6 kg      Telemetry      ECG      Physical Exam     Labs    Chemistry Recent Labs  Lab 06/02/18 1329  06/07/18 0425 06/08/18 0438 06/09/18 0544  NA 140   < > 139 137 135  K 3.8   < > 3.2* 3.8 4.0  CL 105   < > 100 98 99  CO2 25   < > 28 27 26   GLUCOSE 87   < > 100* 102* 113*  BUN 18   < > 21* 26* 23*  CREATININE 0.67   < > 0.85 1.10 0.84  CALCIUM 9.0   < > 9.0 9.1 9.2  PROT 6.8  --   --   --   --   ALBUMIN 4.1  --   --   --   --   AST 15  --   --   --   --   ALT 15  --   --   --   --  ALKPHOS 93  --   --   --   --   BILITOT 1.0  --   --   --   --   GFRNONAA  --    < > >60 >60 >60  GFRAA  --    < > >60 >60 >60  ANIONGAP  --    < > 11 12 10    < > = values in this interval not displayed.     Hematology Recent Labs  Lab 06/02/18 1329 06/05/18 1631 06/07/18 0425  WBC 8.7 8.7 10.2  RBC 4.29 4.16* 4.49  HGB 14.5 13.9 15.0  HCT 42.2 41.3 44.5  MCV 98.3 99.3 99.1  MCH  --  33.4 33.4  MCHC 34.3 33.7 33.7  RDW 13.2 13.0 12.8  PLT 255.0 260 274    Cardiac Enzymes Recent Labs  Lab 06/05/18 1826 06/05/18 2143 06/06/18 0227 06/06/18 0844  TROPONINI <0.03 0.03* <0.03 <0.03   No results for input(s): TROPIPOC in the last 168 hours.   BNP Recent Labs  Lab 06/05/18 1159  PROBNP 544.0*     DDimer  Recent Labs  Lab 06/05/18 1206  DDIMER 2.05*     Radiology    No results found.  Cardiac Studies     Patient Profile     53 y.o. male with a history of hypertension.  He is admitted with aggressive shortness of breath and was found to have acute systolic congestive heart failure.  @COVIDRISKCOMPLICATION @   Assessment & Plan    1.  Acute systolic congestive heart failure: He is currently on carvedilol 3.125 mg twice a day, Entresto 24-26 mg twice a day, spironolactone 12.5  mg a day.  His blood pressure is well controlled at 103/82.  He has diuresed over 4 L during this hospitalization. I discussed the case with Dr. Catha Gosselin.   She agrees that he is ready to go home assuming that he responds to his first dose of Entresto this morning.  We will set him up an appointment to see Dr. Eden Emms or an APP by virtual visit in the next 2 to 3 weeks.  He will need a basic metabolic profile.      [facility] will sign off.   Medication Recommendations:  Continue current meds Other recommendations (labs, testing, etc):   Follow up as an outpatient:   Dr. Eden Emms or APP in 2-3 weeks ( virtual visit )  Will also need a BMP around that time He will eventualy need an ischemic work up and a a cardiac MRI.  Dr. Eden Emms will arrange those as an OP.   For questions or updates, please contact [facility] Please consult [email or web address] for contact info under        Signed, Kristeen Miss, MD  06/09/2018, 10:28 AM

## 2018-06-09 NOTE — Discharge Summary (Signed)
Physician Discharge Summary  Austin [name] NUU:[id number] DOB: 09/12/1965 DOA: 06/05/2018  PCP: Wynn Banker, MD  Admit date: 06/05/2018 Discharge date: 06/09/2018  Time spent: 45 minutes  Recommendations for Outpatient Follow-up:  Patient will be discharged to home.  Patient will need to follow up with primary care provider within one week of discharge, repeat BMP.  Follow up with cardiology, Dr. Eden Emms in 2-3 weeks. Patient should continue medications as prescribed.  Patient should follow a heart healthy diet.   Discharge Diagnoses:  Acute hypoxic respiratory failure secondary to New Systolic CHF Elevated D-Dimer T wave inversions on EKG Anxiety Peptic ulcer disease, GERD Hypokalemia  Discharge Condition: Stable  Diet recommendation: heart healthy  Filed Weights   06/06/18 0500 06/08/18 0500 06/09/18 0652  Weight: 77.6 kg 78.3 kg 77.8 kg    History of present illness:  on 06/05/2018 by Dr. 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 Patient was seen by his PCP and found to have an elevated d-dimer of 2.05and elevated BNP of 544. He was sent to the ER for further evaluation.Patient states he has been feeling short of breath since October 2019 and symptoms have been getting progressively worse, especially for the past 1 month. He feels short of breath both with exertion and at rest. Sometimes his shortness of breath is accompanied by wheezing. Symptoms are especially worse at night when he lays down flat to sleep. He is also having a dry cough. No weight gain or lower extremity edema. Denies any history of CHF or lung disease. Stateshe was previously smoking cigarettes but quit a month ago. 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 His last trip was the end of February to [place]. He has stayed at home for the past 1 month. Denies any fevers, chills, sick contacts, or exposure to any individual with confirmed COVID-19.  Hospital Course:  Acute hypoxic respiratory failure secondary to New Systolic CHF -Ongoing for the past several months, however worsening. Patient admits to now seeing a PCP until recently.  -Patient noted to have hypoxia, 85% on room air upon presentation -CTA chest negative for PE. Heart mildly enlarged with probable degree of pulmonary venous hypertension.  Pleural effusions bilaterally,L>R.  Centrilobular emphysematous change. -Chest x-ray shows stable right infrahilar airspace opacity.  Stable bilateral interstitial infiltrates, curly B-lines. -BNP on admission 544.  D-dimer 2.05 -Echocardiogram: EF 20%, diffuse hypokinesis, inferior akinesis. LV function could not be evaluated.   -Patient placed on IV Lasix 40 mg twice daily- will transition to oral today -Continue to monitor intake and output, daily weights -Weight down 4kg since admission -Continue coreg 3.125mg  BID -Cardiology consulted and appreciated- wanting to start patient on Entresto on 06/09/2018 and started patient on spironolactone 12.5mg  daily. May order Cardiac CT to rule out CAD and MRI to r/o infiltrative disease -weaned off of supplemental oxygen- able to ambulate and maintain saturations in the mid 90s -Started on Entresto today, has tolerated well -Patient to follow-up with Dr. Eden Emms in 2 to 3 weeks  Elevated D-Dimer -CTA chest negative for PE -Lower ext doppler negative for DVT  T wave inversions on EKG -EKG showed T wave inversions V5 and V6.  No prior EKG for comparison -No complaints of chest pain -Troponin currently being cycled and unremarkable  Anxiety -Continue Klonopin  Peptic ulcer disease, GERD -Continue PPI  Hypokalemia -Secondary to diuresis -started on aldactone by  cardiology -resolved -repeat BMP in one week  Consultants Cardiology  Procedures  Echocardiogram Lower extremity doppler  Discharge Exam: Vitals:   06/09/18 0752 06/09/18 1028  BP: 111/84 (!) 100/57  Pulse: 96 88  Resp:    Temp:    SpO2: 94%    Patient feels breathing has improved.  Denies current chest pain, shortness of breath, abdominal pain, nausea or vomiting, diarrhea or constipation, dizziness or headache.   General: Well developed, well nourished, NAD, appears stated age  HEENT: NCAT, mucous membranes moist.  Neck: Supple  Cardiovascular: S1 S2 auscultated, no murmur, RRR  Respiratory: Clear to auscultation bilaterally with equal chest rise  Abdomen: Soft, nontender, nondistended, + bowel sounds  Extremities: warm dry without cyanosis clubbing or edema  Neuro: AAOx3, nonfocal  Psych: Appropriate mood and affect, pleasant   Discharge Instructions Discharge Instructions    Discharge instructions   Complete by:  As directed    Patient will be discharged to home.  Patient will need to follow up with primary care provider within one week of discharge, repeat BMP.  Follow up with cardiology, Dr. Eden Emms in 2-3 weeks. Patient should continue medications as prescribed.  Patient should follow a heart healthy diet.     Allergies as of 06/09/2018   No Known Allergies     Medication List    STOP taking these medications   pantoprazole 40 MG tablet Commonly known as:  PROTONIX     TAKE these medications   acetaminophen 500 MG tablet Commonly known as:  TYLENOL Take 1,000 mg 2 (two) times daily as needed by mouth for moderate pain.   carvedilol 3.125 MG tablet Commonly known as:  COREG Take 1 tablet (3.125 mg total) by mouth 2 (two) times daily with a meal.   clonazePAM 0.5 MG tablet Commonly known as:  KLONOPIN Take 0.5-1 tablets (0.25-0.5 mg total) by mouth daily as needed for anxiety.   MELATONIN PO Take 1 tablet by mouth at bedtime as needed  (sleep).   omeprazole 20 MG tablet Commonly known as:  PRILOSEC OTC Take 20 mg by mouth daily.   sacubitril-valsartan 24-26 MG Commonly known as:  ENTRESTO Take 1 tablet by mouth 2 (two) times daily.   spironolactone 25 MG tablet Commonly known as:  ALDACTONE Take 0.5 tablets (12.5 mg total) by mouth daily. Start taking on:  June 10, 2018      No Known Allergies Follow-up Information    Wynn Banker, MD. Schedule an appointment as soon as possible for a visit in 1 week(s).   Specialty:  Family Medicine Why:  Hospital follow up Contact information: [address] [place] [address] [phone]        Wendall Stade, MD Follow up in 2 week(s).   Specialty:  Cardiology Why:  You have a virtual follow-up visit scheduled for 06/23/2018 at 3:00pm. Contact information: [address] [place] [address] [phone]        [facility] Follow up.   Specialty:  Cardiology Why:  Please come by our office on 06/20/2018 any time from 7:30am to 4:00pm for a lab visit so that we can check your kidney funciton and electrolytes.  Contact information: [address] [place] [address] [phone]           The results of significant diagnostics from this hospitalization (  including imaging, microbiology, ancillary and laboratory) are listed below for reference.    Significant Diagnostic Studies: Dg Chest 2 View  Result Date: 06/05/2018 CLINICAL DATA:  Right lung opacity EXAM: CHEST - 2 VIEW COMPARISON:  06/02/2018 FINDINGS: Stable right infrahilar opacity. Small bilateral pleural effusions. The heart is normal in size. Kerley B-lines are present at both lung bases laterally. No pneumothorax. Vascularity is within normal limits. IMPRESSION: Stable right infrahilar airspace opacity. Stable bilateral interstitial infiltrates characterized by Charyl Dancer B lines. Continued follow-up until complete  resolution is recommended. Electronically Signed   By: Jolaine Click M.D.   On: 06/05/2018 16:14   Dg Chest 2 View  Result Date: 06/02/2018 CLINICAL DATA:  Shortness of breath. EXAM: CHEST - 2 VIEW COMPARISON:  None. FINDINGS: The heart, hila, mediastinum are normal. Mild opacity in the medial right lung base favored to represent vascular crowding. No other evidence of infiltrate. No nodule or mass. IMPRESSION: Opacity in the medial right lung base is favored represent vascular crowding rather than developing infiltrate. Recommend short-term follow-up to ensure resolution. Electronically Signed   By: Gerome Sam III M.D   On: 06/02/2018 16:19   Ct Angio Chest Pe W/cm &/or Wo Cm  Result Date: 06/05/2018 CLINICAL DATA:  Shortness of breath EXAM: CT ANGIOGRAPHY CHEST WITH CONTRAST TECHNIQUE: Multidetector CT imaging of the chest was performed using the standard protocol during bolus administration of intravenous contrast. Multiplanar CT image reconstructions and MIPs were obtained to evaluate the vascular anatomy. CONTRAST:  OMNIPAQUE IOHEXOL 350 MG/ML SOLN COMPARISON:  Chest radiograph June 05, 2018 FINDINGS: Cardiovascular: There Is no demonstrable pulmonary embolus. There is no thoracic aortic aneurysm. No dissection is appreciable. Note that the contrast bolus in the aorta is not sufficient for dissection assessment. Visualized great vessels appear unremarkable. There is no pericardial effusion or pericardial thickening. Heart is mildly enlarged. There is felt to be a degree of pulmonary venous hypertension. Mediastinum/Nodes: Thyroid appears unremarkable. There are scattered subcentimeter mediastinal lymph nodes. There is no appreciable thoracic adenopathy by size criteria. A small amount of pericardial fluid is noted in the subcarinal region, likely physiologic. No esophageal lesions are evident. Lungs/Pleura: There are free-flowing pleural effusions bilaterally, larger on the right than on the  left. There is a degree of underlying centrilobular emphysema. There is atelectatic change in the lung bases. There is interstitial edema bilaterally. There is no frank airspace consolidation. Upper Abdomen: There is reflux of contrast into the inferior vena cava and hepatic veins. Visualized upper abdominal structures otherwise appear unremarkable except for mild abdominal aortic atherosclerosis. Musculoskeletal: There are no blastic or lytic bone lesions. No evident chest wall lesions. Review of the MIP images confirms the above findings. IMPRESSION: 1. No demonstrable pulmonary embolus. No thoracic aortic aneurysm. No dissection evident; the contrast bolus in the aorta is not sufficient for dissection assessment. 2. Heart mildly enlarged with probable degree of pulmonary venous hypertension. Pleural effusions bilaterally, larger on the right than on the left. There is evident interstitial edema. Question a degree of underlying congestive heart failure. 3. Centrilobular emphysematous change. Areas of atelectatic change. No frank airspace consolidation. 4.  No appreciable adenopathy. 5. Reflux of contrast into the inferior vena cava and hepatic veins, a finding most likely indicative of a degree of increased right heart pressure. Emphysema (ICD10-J43.9). Electronically Signed   By: Bretta Bang III M.D.   On: 06/05/2018 17:39   Vas [place] Lower Extremity Venous (dvt)  Result Date: 06/07/2018  Lower Venous Study Indications:  Elevated Ddimer.  Performing Technologist: Chanda Busing RVT  Examination Guidelines: A complete evaluation includes B-mode imaging, spectral Doppler, color Doppler, and power Doppler as needed of all accessible portions of each vessel. Bilateral testing is considered an integral part of a complete examination. Limited examinations for reoccurring indications may be performed as noted.  +---------+---------------+---------+-----------+----------+-------+  RIGHT      Compressibility Phasicity Spontaneity Properties Summary  +---------+---------------+---------+-----------+----------+-------+  CFV       Full            Yes       Yes                             +---------+---------------+---------+-----------+----------+-------+  SFJ       Full                                                      +---------+---------------+---------+-----------+----------+-------+  FV Prox   Full                                                      +---------+---------------+---------+-----------+----------+-------+  FV Mid    Full                                                      +---------+---------------+---------+-----------+----------+-------+  FV Distal Full                                                      +---------+---------------+---------+-----------+----------+-------+  PFV       Full                                                      +---------+---------------+---------+-----------+----------+-------+  POP       Full            Yes       Yes                             +---------+---------------+---------+-----------+----------+-------+  PTV       Full                                                      +---------+---------------+---------+-----------+----------+-------+  PERO      Full                                                      +---------+---------------+---------+-----------+----------+-------+   +---------+---------------+---------+-----------+----------+-------+  LEFT      Compressibility Phasicity Spontaneity Properties Summary  +---------+---------------+---------+-----------+----------+-------+  CFV       Full            Yes       Yes                             +---------+---------------+---------+-----------+----------+-------+  SFJ       Full                                                      +---------+---------------+---------+-----------+----------+-------+  FV Prox   Full                                                       +---------+---------------+---------+-----------+----------+-------+  FV Mid    Full                                                      +---------+---------------+---------+-----------+----------+-------+  FV Distal Full                                                      +---------+---------------+---------+-----------+----------+-------+  PFV       Full                                                      +---------+---------------+---------+-----------+----------+-------+  POP       Full            Yes       Yes                             +---------+---------------+---------+-----------+----------+-------+  PTV       Full                                                      +---------+---------------+---------+-----------+----------+-------+  PERO      Full                                                      +---------+---------------+---------+-----------+----------+-------+     Summary: Right: There is no evidence of deep vein thrombosis in the lower extremity. No cystic structure found in the popliteal fossa. Left: There is no evidence of deep vein thrombosis in the lower extremity. No cystic structure  found in the popliteal fossa.  *See table(s) above for measurements and observations. Electronically signed by Lemar LivingsBrandon Cain MD on 06/07/2018 at 10:26:29 AM.    Final     Microbiology: Recent Results (from the past 240 hour(s))  MRSA PCR Screening     Status: None   Collection Time: 06/05/18 11:16 PM  Result Value Ref Range Status   MRSA by PCR NEGATIVE NEGATIVE Final    Comment:        The GeneXpert MRSA Assay (FDA approved for NASAL specimens only), is one component of a comprehensive MRSA colonization surveillance program. It is not intended to diagnose MRSA infection nor to guide or monitor treatment for MRSA infections. Performed at [facility], [address] [place], [place] [id number]      Labs: Basic Metabolic Panel: Recent Labs  Lab 06/02/18 1329  06/05/18 1631 06/07/18 0425 06/08/18 0438 06/09/18 0544  NA 140 139 139 137 135  K 3.8 3.7 3.2* 3.8 4.0  CL 105 109 100 98 99  CO2 25 22 28 27 26   GLUCOSE 87 119* 100* 102* 113*  BUN 18 20 21* 26* 23*  CREATININE 0.67 0.80 0.85 1.10 0.84  CALCIUM 9.0 8.9 9.0 9.1 9.2   Liver Function Tests: Recent Labs  Lab 06/02/18 1329  AST 15  ALT 15  ALKPHOS 93  BILITOT 1.0  PROT 6.8  ALBUMIN 4.1   No results for input(s): LIPASE, AMYLASE in the last 168 hours. No results for input(s): AMMONIA in the last 168 hours. CBC: Recent Labs  Lab 06/02/18 1329 06/05/18 1631 06/07/18 0425  WBC 8.7 8.7 10.2  NEUTROABS 6.2  --   --   HGB 14.5 13.9 15.0  HCT 42.2 41.3 44.5  MCV 98.3 99.3 99.1  PLT 255.0 260 274   Cardiac Enzymes: Recent Labs  Lab 06/05/18 1826 06/05/18 2143 06/06/18 0227 06/06/18 0844  TROPONINI <0.03 0.03* <0.03 <0.03   BNP: BNP (last 3 results) No results for input(s): BNP in the last 8760 hours.  ProBNP (last 3 results) Recent Labs    06/05/18 1159  PROBNP 544.0*    CBG: No results for input(s): GLUCAP in the last 168 hours.     Signed:  Edsel PetrinMaryann Tiffanny Lamarche  [facility] 06/09/2018, 12:45 PM

## 2018-06-09 NOTE — Telephone Encounter (Signed)
   TELEPHONE CALL NOTE  This patient has been deemed a candidate for follow-up tele-health visit to limit community exposure during the Covid-19 pandemic. I spoke with the patient via phone to discuss instructions. This has been outlined on the patient's AVS (dotphrase: [email or web address]). The patient was advised to review the section on consent for treatment as well. The patient will receive a phone call 2-3 days prior to their E-Visit at which time consent will be verbally confirmed. A Virtual Office Visit appointment type has been scheduled for 06/23/2018 with Dr. Eden Emms, with "VIDEO" or "TELEPHONE" in the appointment notes - patient prefers Video type.  I have either confirmed the patient is active in MyChart or offered to send sign-up link to phone/email via Mychart icon beside patient's photo.  Corrin Parker, PA-C 06/09/2018 11:52 AM

## 2018-06-09 NOTE — Progress Notes (Signed)
Patient discharging home.  IV removed - WNL.  Reviewed AVS and medications, instructed to follow up with PCP and cardio.  Education handouts on HF management at home given.  Patient verbalizes importance of daily weighing and low sodium diet.  No questions at this time.  Patient in NAD  - assisted off unit

## 2018-06-10 ENCOUNTER — Telehealth: Payer: Self-pay | Admitting: *Deleted

## 2018-06-10 ENCOUNTER — Encounter: Payer: Self-pay | Admitting: Family Medicine

## 2018-06-10 NOTE — Telephone Encounter (Signed)
LM for patient to call back and schedule appointment. CRM created.

## 2018-06-10 NOTE — Telephone Encounter (Signed)
Transition Care Management Follow-up Telephone Call   Date discharged?  Discharge date: 06/09/2018  Time spent: 45 minutes  Recommendations for Outpatient Follow-up:  Patient will be discharged to home.  Patient will need to follow up with primary care provider within one week of discharge, repeat BMP.  Follow up with cardiology, Dr. Eden Emms in 2-3 weeks. Patient should continue medications as prescribed.  Patient should follow a heart healthy diet.   Discharge Diagnoses:  Acute hypoxic respiratory failure secondary to New Systolic CHF Elevated D-Dimer T wave inversions on EKG Anxiety Peptic ulcer disease, GERD Hypokalemia  Discharge Condition: Stable  Diet recommendation: heart healthy   How have you been since you were released from the hospital? "feels pretty good"   Do you understand why you were in the hospital? yes   Do you understand the discharge instructions? yes   Where were you discharged to? home   Items Reviewed:  Medications reviewed: yes  Allergies reviewed: yes  Dietary changes reviewed: yes heart healthy  Referrals reviewed: yes - Cardiology   Functional Questionnaire:   Activities of Daily Living (ADLs):   He states they are independent in the following: ambulation, bathing and hygiene, feeding, continence, grooming, toileting and dressing States they require assistance with the following: none   Any transportation issues/concerns?: no   Any patient concerns? no   Confirmed importance and date/time of follow-up visits scheduled yes  Provider Appointment booked with Dr Hassan Rowan 06/11/2018 at 1:30 pm  Confirmed with patient if condition begins to worsen call PCP or go to the ER.  Patient was given the office number and encouraged to call back with question or concerns.  : yes

## 2018-06-11 ENCOUNTER — Encounter: Payer: Self-pay | Admitting: Family Medicine

## 2018-06-11 ENCOUNTER — Ambulatory Visit (INDEPENDENT_AMBULATORY_CARE_PROVIDER_SITE_OTHER): Payer: Managed Care, Other (non HMO) | Admitting: Family Medicine

## 2018-06-11 ENCOUNTER — Other Ambulatory Visit: Payer: Self-pay

## 2018-06-11 DIAGNOSIS — I5021 Acute systolic (congestive) heart failure: Secondary | ICD-10-CM | POA: Diagnosis not present

## 2018-06-11 DIAGNOSIS — R39198 Other difficulties with micturition: Secondary | ICD-10-CM

## 2018-06-11 DIAGNOSIS — F419 Anxiety disorder, unspecified: Secondary | ICD-10-CM

## 2018-06-11 NOTE — Progress Notes (Signed)
Virtual Visit via Video Note  I connected with@ on 06/11/18 at  1:30 PM EDT by a video enabled telemedicine application and verified that I am speaking with the correct person using two identifiers.  Location patient: home Location provider:work or home office Persons participating in the virtual visit: patient, provider  I discussed the limitations of evaluation and management by telemedicine and the availability of in person appointments. The patient expressed understanding and agreed to proceed.   Austin [name] DOB: [birth year] Encounter date: 06/11/2018  This is a 53 y.o. male who presents with Chief Complaint  Patient presents with  . Hospitalization Follow-up    History of present illness: Sent to hospital after worsening SOB, elevated d dimer/BNP in office. Noted to be in acute systolic HF. Started on lsaix, coreg 3.125mg  BID, spironolactone 12.5mg  daily, entresto. CTA negative for PE. LE doppler neg for DVT.   Needs repeat BMP in a week. Follow up w cardiology 2-3 weeks. They are considering further workup outpatient pending his response (including cardiac CT/MRI/cath)  HPI  Has lost 10 lbs of fluid since diuresis.  Feeling well now. Doing ok with new medications. Was tired on Monday when he got home. He has been tracking weight and bp.  Started yesterday: weight 170, bp 100/77 HR 86 Today: weight 170.6, bp 113/92 HR 84 bp this afternoon was 114/80 HR 83 Started working today back in office. Not light headed, not dizzy.   Took short walk this morning which felt great. Breathing feels 100% better. Hasn't slept this well in a long time. Feels tremendously better than he did prior to hospitalization. Has bloodwork scheduled on Friday and then cardiology follow up on the 4th.   Still has some anxiety. Klonopin is helpful for this but will try to limit dosing.   Sometimes feels like bladder doesn't empty completely when he urinates. Noted this prior to taking the lasix.  Has noted in last year; stream has decreased.   No Known Allergies Current Meds  Medication Sig  . acetaminophen (TYLENOL) 500 MG tablet Take 1,000 mg 2 (two) times daily as needed by mouth for moderate pain.  . carvedilol (COREG) 3.125 MG tablet Take 1 tablet (3.125 mg total) by mouth 2 (two) times daily with a meal.  . clonazePAM (KLONOPIN) 0.5 MG tablet Take 0.5-1 tablets (0.25-0.5 mg total) by mouth daily as needed for anxiety.  Marland Kitchen MELATONIN PO Take 1 tablet by mouth at bedtime as needed (sleep).  Marland Kitchen omeprazole (PRILOSEC OTC) 20 MG tablet Take 20 mg by mouth daily.  . sacubitril-valsartan (ENTRESTO) 24-26 MG Take 1 tablet by mouth 2 (two) times daily.  Marland Kitchen spironolactone (ALDACTONE) 25 MG tablet Take 0.5 tablets (12.5 mg total) by mouth daily.    Review of Systems  Constitutional: Negative for chills, fatigue and fever.  Respiratory: Negative for cough, chest tightness, shortness of breath and wheezing.   Cardiovascular: Negative for chest pain, palpitations and leg swelling.    Objective:  There were no vitals taken for this visit.      BP Readings from Last 3 Encounters:  06/09/18 107/64  06/05/18 110/60  12/31/16 110/87   Wt Readings from Last 3 Encounters:  06/09/18 171 lb 9.6 oz (77.8 kg)  06/05/18 180 lb 9.6 oz (81.9 kg)  12/31/16 165 lb (74.8 kg)    EXAM:  GENERAL: alert, oriented, appears well and in no acute distress  HEENT: atraumatic, conjunctiva clear, no obvious abnormalities on inspection of external nose and ears  NECK:  normal movements of the head and neck  LUNGS: on inspection no signs of respiratory distress, breathing rate appears normal, no obvious gross SOB, gasping or wheezing  CV: no obvious cyanosis  MS: moves all visible extremities without noticeable abnormality  PSYCH/NEURO: pleasant and cooperative, no obvious depression or anxiety, speech and thought processing grossly intact  Assessment/Plan  1. Acute systolic heart failure  (HCC) Doing very well with current medications. Breathing significant better. Has bloodwork scheduled at end of week; cardiology visit next week for followup.  2. Anxiety Improved, still would like to keep klonopin on hand for anxiety if needed. Knows to use sparingly. OK for single refill prior to fall visit.  3. Slow urinary stream Mentioned at end of visit decrease in stream strength and emptying. Will see if lab can add this to bloodwork. I will call with results once obtained. If they cannot add we can have him return for physical/exam sooner. - PSA    I discussed the assessment and treatment plan with the patient. The patient was provided an opportunity to ask questions and all were answered. The patient agreed with the plan and demonstrated an understanding of the instructions.   The patient was advised to call back or seek an in-person evaluation if the symptoms worsen or if the condition fails to improve as anticipated.    Theodis Shove, MD

## 2018-06-20 ENCOUNTER — Other Ambulatory Visit: Payer: Managed Care, Other (non HMO) | Admitting: *Deleted

## 2018-06-20 ENCOUNTER — Telehealth: Payer: Self-pay | Admitting: *Deleted

## 2018-06-20 ENCOUNTER — Other Ambulatory Visit: Payer: Self-pay

## 2018-06-20 DIAGNOSIS — I5021 Acute systolic (congestive) heart failure: Secondary | ICD-10-CM

## 2018-06-20 LAB — BASIC METABOLIC PANEL
BUN/Creatinine Ratio: 26 — ABNORMAL HIGH (ref 9–20)
BUN: 23 mg/dL (ref 6–24)
CO2: 21 mmol/L (ref 20–29)
Calcium: 9.7 mg/dL (ref 8.7–10.2)
Chloride: 103 mmol/L (ref 96–106)
Creatinine, Ser: 0.87 mg/dL (ref 0.76–1.27)
GFR calc Af Amer: 116 mL/min/{1.73_m2} (ref >59)
GFR calc non Af Amer: 101 mL/min/{1.73_m2} (ref >59)
Glucose: 90 mg/dL (ref 65–99)
Potassium: 4.4 mmol/L (ref 3.5–5.2)
Sodium: 140 mmol/L (ref 134–144)

## 2018-06-20 NOTE — Telephone Encounter (Signed)
Pt walk in/ needs  labs, bmet order placed from last [facility] admit,

## 2018-06-21 NOTE — Progress Notes (Signed)
Virtual Visit via Video Note   This visit type was conducted due to national recommendations for restrictions regarding the COVID-19 Pandemic (e.g. social distancing) in an effort to limit this patient's exposure and mitigate transmission in our community.  Due to his co-morbid illnesses, this patient is at least at moderate risk for complications without adequate follow up.  This format is felt to be most appropriate for this patient at this time.  All issues noted in this document were discussed and addressed.  A limited physical exam was performed with this format.  Please refer to the patient's chart for his consent to telehealth for [facility].   Date:  06/23/2018   ID:  Austin [name], DOB October 31, 1965, MRN [id number]  Patient Location: Home Provider Location: Office  PCP:  Wynn Banker, MD  Cardiologist:  Charlton Haws, MD   Electrophysiologist:  None   Evaluation Performed:  Follow-Up Visit  Chief Complaint:   CHF/Dyspnea  History of Present Illness:    53 y.o. first seen consult 06/07/18 for acute CHF. Admitted 06/05/2018 with increasing shortness of breath.  Other medical issues include a history of GI bleeding in 2018, and anxiety.  The patient reportedly had been noticing increasing shortness of breath for the last few months.  In the emergency room he was noted to be hypoxic with a PO2 of 88. He was placed on BiPAP. Chest x-ray showed bilateral interstitial infiltrates and curly B-lines.  CT angiogram was negative for pulmonary embolism.  His BNP was 554, his troponin have been negative x3.  Echocardiogram showed significant dilated LV dysfunction with an ejection fraction of 20% and diffuse hypokinesis with inferior akinesis.  D/C with coreg 3.125 bid, Entresto 24/26 mg and aldactone 25 mg with low BP BMET f/u 06/20/18 BUN 23 Cr .84 K 4.4   Doing well with no symptoms of CHF  The patient does not have symptoms concerning for COVID-19 infection (fever, chills,  cough, or new shortness of breath).    Past Medical History:  Diagnosis Date  . Anxiety   . Gastric ulcer   . GERD (gastroesophageal reflux disease)   . Guillain Barr syndrome (HCC) 1984   COMPLETE RECOVERY; triggered from flu illness  . Headache(784.0)    hx of tension-none recent  . Hematemesis 09/07/2016  . History of kidney stones 09/19/12   left ureteral stone-SURGERY 8/6 URETEROSCOPY / STENT PLACEMENT   . Partial gastric outlet obstruction   . UGIB (upper gastrointestinal bleed) 09/07/2016   Past Surgical History:  Procedure Laterality Date  . BALLOON DILATION N/A 11/15/2016   Procedure: BALLOON DILATION;  Surgeon: Benancio Deeds, MD;  Location: [facility];  Service: Gastroenterology;  Laterality: N/A;  . CYSTO, LEFT RGP, DIAGNOSTIC URETEROSCOPY AND STENT PLACEMENT  09/24/2012  . CYSTOSCOPY WITH RETROGRADE PYELOGRAM, URETEROSCOPY AND STENT PLACEMENT Left 09/24/2012   Procedure: CYSTOSCOPY WITH RETROGRADE PYELOGRAM, DIAGNOSTIC URETEROSCOPY AND STENT PLACEMENT;  Surgeon: Sebastian Ache, MD;  Location: [facility];  Service: Urology;  Laterality: Left;  . CYSTOSCOPY WITH RETROGRADE PYELOGRAM, URETEROSCOPY AND STENT PLACEMENT Left 10/08/2012   Procedure: CYSTOSCOPY WITH RETROGRADE PYELOGRAM, URETEROSCOPY AND STENT PLACEMENT;  Surgeon: Sebastian Ache, MD;  Location: [facility];  Service: Urology;  Laterality: Left;  1 HR NEEDS DIGITAL URETEROSCOPE   . ESOPHAGOGASTRODUODENOSCOPY N/A 09/08/2016   Procedure: ESOPHAGOGASTRODUODENOSCOPY (EGD);  Surgeon: Ruffin Frederick, MD;  Location: [facility];  Service: Gastroenterology;  Laterality: N/A;  . ESOPHAGOGASTRODUODENOSCOPY N/A 11/15/2016   Procedure: ESOPHAGOGASTRODUODENOSCOPY (EGD);  Surgeon: Benancio Deeds,  MD;  Location: [facility];  Service: Gastroenterology;  Laterality: N/A;  . ESOPHAGOGASTRODUODENOSCOPY N/A 12/31/2016   Procedure: ESOPHAGOGASTRODUODENOSCOPY (EGD);  Surgeon: Benancio Deeds, MD;  Location: [facility];   Service: Gastroenterology;  Laterality: N/A;  . ESOPHAGOGASTRODUODENOSCOPY (EGD) WITH PROPOFOL N/A 10/15/2016   Procedure: ESOPHAGOGASTRODUODENOSCOPY (EGD) WITH PROPOFOL; Duodenal dilation;  Surgeon: Ruffin Frederick, MD;  Location: [facility];  Service: Gastroenterology;  Laterality: N/A;  . HOLMIUM LASER APPLICATION Left 10/08/2012   Procedure: HOLMIUM LASER APPLICATION;  Surgeon: Sebastian Ache, MD;  Location: [facility];  Service: Urology;  Laterality: Left;  . SAVORY DILATION N/A 12/31/2016   Procedure: POSSIBLE SAVORY DILATION;  Surgeon: Benancio Deeds, MD;  Location: [facility];  Service: Gastroenterology;  Laterality: N/A;  . TONSILLECTOMY     t&a as a child  . wisdom teeth extracted       Current Meds  Medication Sig  . acetaminophen (TYLENOL) 500 MG tablet Take 1,000 mg 2 (two) times daily as needed by mouth for moderate pain.  . carvedilol (COREG) 3.125 MG tablet Take 1 tablet (3.125 mg total) by mouth 2 (two) times daily with a meal.  . clonazePAM (KLONOPIN) 0.5 MG tablet Take 0.5-1 tablets (0.25-0.5 mg total) by mouth daily as needed for anxiety.  Marland Kitchen MELATONIN PO Take 1 tablet by mouth at bedtime as needed (sleep).  Marland Kitchen omeprazole (PRILOSEC OTC) 20 MG tablet Take 20 mg by mouth daily.  . sacubitril-valsartan (ENTRESTO) 24-26 MG Take 1 tablet by mouth 2 (two) times daily.  Marland Kitchen spironolactone (ALDACTONE) 25 MG tablet Take 0.5 tablets (12.5 mg total) by mouth daily.     Allergies:   Patient has no known allergies.   Social History   Tobacco Use  . Smoking status: Former Smoker    Packs/day: 1.50    Years: 28.00    Pack years: 42.00    Types: Cigarettes    Last attempt to quit: 05/02/2018    Years since quitting: 0.1  . Smokeless tobacco: Never Used  Substance Use Topics  . Alcohol use: Yes    Comment: occasional  . Drug use: No    Types: Marijuana    Comment: in the past     Family Hx: The patient's family history includes Congestive Heart Failure in his  mother; Heart disease in his mother; Heart disease (age of onset: 26) in his brother; Hemachromatosis in his father; Rheum arthritis in his father; Ulcerative colitis in his father.  ROS:   Please see the history of present illness.     All other systems reviewed and are negative.   Prior CV studies:   The following studies were reviewed today:  Echo 06/06/18  Labs/Other Tests and Data Reviewed:    EKG:  ST rate 116 IVCD LVH  Recent Labs: 06/02/2018: ALT 15; TSH 1.44 06/05/2018: Pro B Natriuretic peptide (BNP) 544.0 06/07/2018: Hemoglobin 15.0; Platelets 274 06/20/2018: BUN 23; Creatinine, Ser 0.87; Potassium 4.4; Sodium 140   Recent Lipid Panel Lab Results  Component Value Date/Time   CHOL 203 (H) 06/02/2018 01:29 PM   TRIG 110.0 06/02/2018 01:29 PM   HDL 36.40 (L) 06/02/2018 01:29 PM   CHOLHDL 6 06/02/2018 01:29 PM   LDLCALC 145 (H) 06/02/2018 01:29 PM    Wt Readings from Last 3 Encounters:  06/23/18 77.2 kg  06/09/18 77.8 kg  06/05/18 81.9 kg     Objective:    Vital Signs:  BP 102/78   Pulse (!) 104   Ht 6' (1.829 m)  Wt 77.2 kg   BMI 23.08 kg/m    No distress Skin warm and dry JVP not noted No tachypnea No edema   ASSESSMENT & PLAN:    1. 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 Discussed cardiac CTA or right and left cath to r/o CAD given severity of LV dysfunction  Since increasing coreg will do echo in 3 months and see me same day If HR lower will favor cardiac CT Functional class one now !! 2. Anxiety:  PRN Klonopin f/u primary 3. GI bleed history of on Prilosec F/U [facility]  COVID-19 Education: The signs and symptoms of COVID-19 were discussed with the patient and how to seek care for testing (follow up with PCP or arrange E-visit).  The importance of social distancing was discussed today.  Time:   Today, I have spent 30 minutes with the patient with telehealth technology discussing the above problems.      Medication Adjustments/Labs and Tests Ordered: Current medicines are reviewed at length with the patient today.  Concerns regarding medicines are outlined above.   Tests Ordered:  Echo in 3 months   Medication Changes: No orders of the defined types were placed in this encounter.   Disposition:  Follow up 3 months   Signed, Charlton Haws, MD  06/23/2018 2:41 PM    [facility]

## 2018-06-23 ENCOUNTER — Telehealth (INDEPENDENT_AMBULATORY_CARE_PROVIDER_SITE_OTHER): Payer: Managed Care, Other (non HMO) | Admitting: Cardiovascular Disease

## 2018-06-23 ENCOUNTER — Encounter: Payer: Self-pay | Admitting: Cardiovascular Disease

## 2018-06-23 ENCOUNTER — Other Ambulatory Visit: Payer: Self-pay

## 2018-06-23 VITALS — BP 102/78 | HR 104 | Ht 72.0 in | Wt 170.2 lb

## 2018-06-23 DIAGNOSIS — I5043 Acute on chronic combined systolic (congestive) and diastolic (congestive) heart failure: Secondary | ICD-10-CM | POA: Diagnosis not present

## 2018-06-23 MED ORDER — CARVEDILOL 6.25 MG PO TABS: 6.2500 mg | ORAL_TABLET | Freq: Two times a day (BID) | ORAL | 3 refills | Status: DC

## 2018-06-23 NOTE — Patient Instructions (Signed)
Medication Instructions:  Your physician has recommended you make the following change in your medication:  1-INCREASE Carvedilol 6.25 mg by mouth twice daily.  If you need a refill on your cardiac medications before your next appointment, please call your pharmacy.   Lab work:  If you have labs (blood work) drawn today and your tests are completely normal, you will receive your results only by: Marland Kitchen MyChart Message (if you have MyChart) OR . A paper copy in the mail If you have any lab test that is abnormal or we need to change your treatment, we will call you to review the results.  Testing/Procedures: None ordered today.  Follow-Up: At [facility], you and your health needs are our priority.  As part of our continuing mission to provide you with exceptional heart care, we have created designated Provider Care Teams.  These Care Teams include your primary Cardiologist (physician) and Advanced Practice Providers (APPs -  Physician Assistants and Nurse Practitioners) who all work together to provide you with the care you need, when you need it. You will need a follow up appointment in 3 months.  You may see Charlton Haws, MD or one of the following Advanced Practice Providers on your designated Care Team:   Norma Fredrickson, NP Nada Boozer, NP . Georgie Chard, NP

## 2018-07-01 ENCOUNTER — Other Ambulatory Visit: Payer: Self-pay | Admitting: Family Medicine

## 2018-07-01 NOTE — Telephone Encounter (Signed)
Last Rx given on 4/13 for #15 with no ref

## 2018-07-02 MED ORDER — CLONAZEPAM 0.5 MG PO TABS: 0.2500 mg | ORAL_TABLET | Freq: Every day | ORAL | 0 refills | Status: DC | PRN

## 2018-07-18 ENCOUNTER — Other Ambulatory Visit: Payer: Self-pay | Admitting: Cardiovascular Disease

## 2018-07-18 MED ORDER — SACUBITRIL-VALSARTAN 24-26 MG PO TABS: 1.0000 | ORAL_TABLET | Freq: Two times a day (BID) | ORAL | 11 refills | Status: DC

## 2018-07-18 MED ORDER — SPIRONOLACTONE 25 MG PO TABS: 12.5000 mg | ORAL_TABLET | Freq: Every day | ORAL | 3 refills | Status: DC

## 2018-07-18 NOTE — Telephone Encounter (Signed)
I informed pt that I would leave a 30 day coupon card and a $10 copay card for Austin [name] downstairs for pt to pick up. Pt verbalized understanding.

## 2018-08-18 ENCOUNTER — Other Ambulatory Visit: Payer: Self-pay | Admitting: Family Medicine

## 2018-08-19 MED ORDER — CLONAZEPAM 0.5 MG PO TABS: 0.2500 mg | ORAL_TABLET | Freq: Every day | ORAL | 0 refills | Status: DC | PRN

## 2018-09-19 ENCOUNTER — Other Ambulatory Visit: Payer: Self-pay | Admitting: Adult Health

## 2018-09-19 MED ORDER — CLONAZEPAM 0.5 MG PO TABS: 0.2500 mg | ORAL_TABLET | Freq: Every day | ORAL | 0 refills | Status: DC | PRN

## 2018-10-03 NOTE — Progress Notes (Signed)
Date:  10/10/2018   ID:  Austin [name], DOB [birth year], MRN [id number]  Provider Location: Office  PCP:  Wynn Banker, MD  Cardiologist:  Charlton Haws, MD   Electrophysiologist:  None   Evaluation Performed:  Follow-Up Visit  Chief Complaint:   CHF/Dyspnea  History of Present Illness:    53 y.o. first seen consult 06/07/18 for acute CHF. Admitted 06/05/2018 with increasing shortness of breath.  Other medical issues include a history of GI bleeding in 2018, and anxiety.  The patient reportedly had been noticing increasing shortness of breath for the last few months.  In the emergency room he was noted to be hypoxic with a PO2 of 88. He was placed on BiPAP. Chest x-ray showed bilateral interstitial infiltrates and curly B-lines.  CT angiogram was negative for pulmonary embolism.  His BNP was 554, his troponin have been negative x3.  Echocardiogram showed significant dilated LV dysfunction with an ejection fraction of 20% and diffuse hypokinesis with inferior akinesis.  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    Past Medical History:  Diagnosis Date  . Anxiety   . Gastric ulcer   . GERD (gastroesophageal reflux disease)   . Guillain Barr syndrome (HCC) 1984   COMPLETE RECOVERY; triggered from flu illness  . Headache(784.0)    hx of tension-none recent  . Hematemesis 09/07/2016  . History of kidney stones 09/19/12   left ureteral stone-SURGERY 8/6 URETEROSCOPY / STENT PLACEMENT   . Partial gastric outlet obstruction   . UGIB (upper gastrointestinal bleed) 09/07/2016   Past Surgical History:  Procedure Laterality Date  . BALLOON DILATION N/A 11/15/2016   Procedure: BALLOON DILATION;   Surgeon: Benancio Deeds, MD;  Location: [facility];  Service: Gastroenterology;  Laterality: N/A;  . CYSTO, LEFT RGP, DIAGNOSTIC URETEROSCOPY AND STENT PLACEMENT  09/24/2012  . CYSTOSCOPY WITH RETROGRADE PYELOGRAM, URETEROSCOPY AND STENT PLACEMENT Left 09/24/2012   Procedure: CYSTOSCOPY WITH RETROGRADE PYELOGRAM, DIAGNOSTIC URETEROSCOPY AND STENT PLACEMENT;  Surgeon: Sebastian Ache, MD;  Location: [facility];  Service: Urology;  Laterality: Left;  . CYSTOSCOPY WITH RETROGRADE PYELOGRAM, URETEROSCOPY AND STENT PLACEMENT Left 10/08/2012   Procedure: CYSTOSCOPY WITH RETROGRADE PYELOGRAM, URETEROSCOPY AND STENT PLACEMENT;  Surgeon: Sebastian Ache, MD;  Location: [facility];  Service: Urology;  Laterality: Left;  1 HR NEEDS DIGITAL URETEROSCOPE   . ESOPHAGOGASTRODUODENOSCOPY N/A 09/08/2016   Procedure: ESOPHAGOGASTRODUODENOSCOPY (EGD);  Surgeon: Ruffin Frederick, MD;  Location: [facility];  Service: Gastroenterology;  Laterality: N/A;  . ESOPHAGOGASTRODUODENOSCOPY N/A 11/15/2016   Procedure: ESOPHAGOGASTRODUODENOSCOPY (EGD);  Surgeon: Benancio Deeds, MD;  Location: [facility];  Service: Gastroenterology;  Laterality: N/A;  . ESOPHAGOGASTRODUODENOSCOPY N/A 12/31/2016   Procedure: ESOPHAGOGASTRODUODENOSCOPY (EGD);  Surgeon: Benancio Deeds, MD;  Location: [facility];  Service: Gastroenterology;  Laterality: N/A;  . ESOPHAGOGASTRODUODENOSCOPY (EGD) WITH PROPOFOL N/A 10/15/2016   Procedure: ESOPHAGOGASTRODUODENOSCOPY (EGD) WITH PROPOFOL; Duodenal dilation;  Surgeon: Ruffin Frederick, MD;  Location: [facility] END[facility];  Service: Gastroenterology;  Laterality: N/A;  . HOLMIUM LASER APPLICATION Left 10/08/2012   Procedure: HOLMIUM LASER APPLICATION;  Surgeon: Sebastian Ache, MD;  Location: [facility];  Service: Urology;  Laterality: Left;  . SAVORY DILATION N/A 12/31/2016  Procedure: POSSIBLE SAVORY DILATION;  Surgeon: Yetta Flock, MD;  Location: [facility];  Service: Gastroenterology;   Laterality: N/A;  . TONSILLECTOMY     t&a as a child  . wisdom teeth extracted       Current Meds  Medication Sig  . acetaminophen (TYLENOL) 500 MG tablet Take 1,000 mg 2 (two) times daily as needed by mouth for moderate pain.  . carvedilol (COREG) 6.25 MG tablet Take 1 tablet (6.25 mg total) by mouth 2 (two) times daily with a meal.  . clonazePAM (KLONOPIN) 0.5 MG tablet Take 0.5-1 tablets (0.25-0.5 mg total) by mouth daily as needed for anxiety.  Marland Kitchen MELATONIN PO Take 1 tablet by mouth at bedtime as needed (sleep).  Marland Kitchen omeprazole (PRILOSEC OTC) 20 MG tablet Take 20 mg by mouth daily.  Marland Kitchen spironolactone (ALDACTONE) 25 MG tablet Take 0.5 tablets (12.5 mg total) by mouth daily.  . [DISCONTINUED] sacubitril-valsartan (ENTRESTO) 24-26 MG Take 1 tablet by mouth 2 (two) times daily.     Allergies:   Patient has no known allergies.   Social History   Tobacco Use  . Smoking status: Former Smoker    Packs/day: 1.50    Years: 28.00    Pack years: 42.00    Types: Cigarettes    Quit date: 05/02/2018    Years since quitting: 0.4  . Smokeless tobacco: Never Used  Substance Use Topics  . Alcohol use: Yes    Comment: occasional  . Drug use: No    Types: Marijuana    Comment: in the past     Family Hx: The patient's family history includes Congestive Heart Failure in his mother; Heart disease in his mother; Heart disease (age of onset: 66) in his brother; Hemachromatosis in his father; Rheum arthritis in his father; Ulcerative colitis in his father.  ROS:   Please see the history of present illness.     All other systems reviewed and are negative.   Prior CV studies:   The following studies were reviewed today:  Echo 06/06/18  Labs/Other Tests and Data Reviewed:    EKG:  ST rate 116 IVCD LVH  Recent Labs: 06/02/2018: ALT 15; TSH 1.44 06/05/2018: Pro B Natriuretic peptide (BNP) 544.0 06/07/2018: Hemoglobin 15.0; Platelets 274 06/20/2018: BUN 23; Creatinine, Ser 0.87; Potassium 4.4;  Sodium 140   Recent Lipid Panel Lab Results  Component Value Date/Time   CHOL 203 (H) 06/02/2018 01:29 PM   TRIG 110.0 06/02/2018 01:29 PM   HDL 36.40 (L) 06/02/2018 01:29 PM   CHOLHDL 6 06/02/2018 01:29 PM   LDLCALC 145 (H) 06/02/2018 01:29 PM    Wt Readings from Last 3 Encounters:  10/10/18 181 lb (82.1 kg)  06/23/18 170 lb 3.2 oz (77.2 kg)  06/09/18 171 lb 9.6 oz (77.8 kg)     Objective:    Vital Signs:  BP 118/84   Pulse 74   Ht 6' (1.829 m)   Wt 181 lb (82.1 kg)   SpO2 96%   BMI 24.55 kg/m    Affect appropriate Healthy:  appears stated age HEENT: normal Neck supple with no adenopathy JVP normal no bruits no thyromegaly Lungs clear with no wheezing and good diaphragmatic motion Heart:  S1/S2 no murmur, no rub, gallop or click PMI  Enlarged  Abdomen: benighn, BS positve, no tenderness, no AAA no bruit.  No HSM or HJR Distal pulses intact with no bruits No edema Neuro non-focal Skin warm and dry No muscular weakness    ASSESSMENT &  PLAN:    1. 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 Needs f/u echo for EF and optimization of meds.  Also needs evaluation for CAD discussed increasing entresto then coreg Also discussed f/u cardiac CT to r/o CAD once meds titrated and further EF evaluation to risk stratify for AICD  2. Anxiety:  PRN Klonopin f/u primary 3. GI bleed history of on Prilosec F/U [facility]  COVID-19 Education: The signs and symptoms of COVID-19 were discussed with the patient and how to seek care for testing (follow up with PCP or arrange E-visit).  The importance of social distancing was discussed today.  Time:   Today, I have spent 30 minutes with the patient with telehealth technology discussing the above problems.     Medication Adjustments/Labs and Tests Ordered: Current medicines are reviewed at length with the patient today.  Concerns regarding medicines are outlined above.   Tests Ordered:   BMET   Medication Changes:  Increase Entresto  Disposition:  Follow up 3 months   Signed, Charlton HawsPeter Tristen Pennino, MD  10/10/2018 9:57 AM    [facility]

## 2018-10-10 ENCOUNTER — Encounter: Payer: Self-pay | Admitting: Cardiovascular Disease

## 2018-10-10 ENCOUNTER — Ambulatory Visit (INDEPENDENT_AMBULATORY_CARE_PROVIDER_SITE_OTHER): Payer: Managed Care, Other (non HMO) | Admitting: Cardiovascular Disease

## 2018-10-10 ENCOUNTER — Other Ambulatory Visit: Payer: Self-pay

## 2018-10-10 VITALS — BP 118/84 | HR 74 | Ht 72.0 in | Wt 181.0 lb

## 2018-10-10 DIAGNOSIS — I5043 Acute on chronic combined systolic (congestive) and diastolic (congestive) heart failure: Secondary | ICD-10-CM | POA: Diagnosis not present

## 2018-10-10 MED ORDER — SACUBITRIL-VALSARTAN 49-51 MG PO TABS: 1.0000 | ORAL_TABLET | Freq: Two times a day (BID) | ORAL | 6 refills | Status: DC

## 2018-10-10 NOTE — Patient Instructions (Signed)
Your physician has recommended you make the following change in your medication:  INCREASE ENTRESTO TO 49/51 MG TWICE DAILY  Your physician recommends that you return for lab work in:  [place] BNP DUE AROUND 9/11 Your physician recommends that you schedule a follow-up appointment in:  Austin [name]

## 2018-10-31 ENCOUNTER — Other Ambulatory Visit: Payer: Managed Care, Other (non HMO) | Admitting: *Deleted

## 2018-10-31 ENCOUNTER — Other Ambulatory Visit: Payer: Self-pay

## 2018-10-31 DIAGNOSIS — I5043 Acute on chronic combined systolic (congestive) and diastolic (congestive) heart failure: Secondary | ICD-10-CM

## 2018-11-01 LAB — BASIC METABOLIC PANEL
BUN/Creatinine Ratio: 22 — ABNORMAL HIGH (ref 9–20)
BUN: 15 mg/dL (ref 6–24)
CO2: 24 mmol/L (ref 20–29)
Calcium: 9.1 mg/dL (ref 8.7–10.2)
Chloride: 103 mmol/L (ref 96–106)
Creatinine, Ser: 0.68 mg/dL — ABNORMAL LOW (ref 0.76–1.27)
GFR calc Af Amer: 128 mL/min/{1.73_m2} (ref >59)
GFR calc non Af Amer: 111 mL/min/{1.73_m2} (ref >59)
Glucose: 115 mg/dL — ABNORMAL HIGH (ref 65–99)
Potassium: 4 mmol/L (ref 3.5–5.2)
Sodium: 138 mmol/L (ref 134–144)

## 2018-11-01 LAB — PRO B NATRIURETIC PEPTIDE: NT-Pro BNP: 1054 pg/mL — ABNORMAL HIGH (ref 0–121)

## 2018-11-03 ENCOUNTER — Telehealth: Payer: Self-pay | Admitting: Nurse Practitioner

## 2018-11-03 NOTE — Telephone Encounter (Signed)
Spoke with patient to review lab results and ask him about any symptoms that he is having. He states he is not having any SOB, orthopnea, or weight changes. He states he weighs consistently on a daily basis and if there is an increase it is only 1 lb. He started Entresto 49-51 mg on 8/21. He is agreeable to starting higher dose and has an appointment scheduled with Dr. Johnsie Cancel for 10/2 (currently scheduled as virtual visit).  I searched for an appointment with Pharm D and there is not anything available until after 10/2. I advised patient I will call him back later regarding the plan for the increase in Entresto He verbalized understanding and agreement. I am routing message to Pharm D for possible sooner appointment and to Dr. Johnsie Cancel for possible change in plan.

## 2018-11-03 NOTE — Telephone Encounter (Signed)
Per Lenna Sciara, [facility], first available appointment with Pharm D is 9/25 I called patient to discuss BP readings which he monitors regularly. He also tracks his weight daily. He denies that he has gained more than 1 lb in a day or more than 5 lb in a week Wt 178 lb past 2 days  9/14 135/98, HR 78  9/13 126/97, HR 80 9/2 @0700  114/78, HR 76   @ 1900 118/81, HR 76 He typically monitors his BP immediately after taking his medication. He denies dizziness, light-headedness, or fatigue associated with positional changes. States he notices mild SOB on occasion, but "nothing like I felt prior to my hospitalization." I advised that I will forward message with this information to Dr. Johnsie Cancel for review and call him back regarding the appointment.

## 2018-11-03 NOTE — Telephone Encounter (Signed)
-----   Message from Josue Hector, MD sent at 11/03/2018  7:15 AM EDT ----- Evaristo Bury ok BNP elevated his weight is up is no dyspnea should be seeing pharm D to tritrate entresto if any edema/dyspnea add lasix 20 mg daily and fu labs 6 weeks

## 2018-11-06 ENCOUNTER — Other Ambulatory Visit: Payer: Self-pay | Admitting: Family Medicine

## 2018-11-06 MED ORDER — ENTRESTO 97-103 MG PO TABS: 1.0000 | ORAL_TABLET | Freq: Two times a day (BID) | ORAL | 11 refills | Status: DC

## 2018-11-06 NOTE — Telephone Encounter (Signed)
Spoke with patient and advised him of the recommendation to increase Entresto to 97/103mg  BID. Patient is agreeable to change. He will continue to monitor his weight and blood pressure at home. He will follow up in the PharmD clinic on 11/24/2018. He states he has a few days of the 49/51mg  tablets left, asked if it was ok to take 2. Advised it was ok to double up until he runs out.  Dr. Chilton Si is scheduled to see you virtually on 10/2 do you want patient to keep this appointment?

## 2018-11-06 NOTE — Telephone Encounter (Signed)
He can keep virtual appointment

## 2018-11-07 NOTE — Telephone Encounter (Signed)
Routing to PCP for approval.

## 2018-11-08 MED ORDER — CLONAZEPAM 0.5 MG PO TABS: 0.2500 mg | ORAL_TABLET | Freq: Every day | ORAL | 0 refills | Status: DC | PRN

## 2018-11-17 NOTE — Telephone Encounter (Signed)
Left message fo This encounter was created in error - please disregard.

## 2018-11-19 NOTE — Progress Notes (Signed)
Date:  11/21/2018   ID:  Austin [name], DOB [birth year], MRN [id number]  Provider Location: Office  PCP:  Wynn Banker, MD  Cardiologist:  Charlton Haws, MD   Electrophysiologist:  None   Evaluation Performed:  Follow-Up Visit  Chief Complaint:   CHF/Dyspnea  History of Present Illness:    53 y.o. first seen consult 06/07/18 for acute CHF. Admitted 06/05/2018 with increasing shortness of breath.  Other medical issues include a history of GI bleeding in 2018, and anxiety.  The patient reportedly had been noticing increasing shortness of breath for the last few months.  In the emergency room he was noted to be hypoxic with a PO2 of 88. He was placed on BiPAP. Chest x-ray showed bilateral interstitial infiltrates and curly B-lines.  CT angiogram was negative for pulmonary embolism.  His BNP was 554, his troponin have been negative x3.  Echocardiogram showed significant dilated LV dysfunction with an ejection fraction of 20% and diffuse hypokinesis with inferior akinesis.  D/C with coreg 3.125 bid, Entresto 24/26 mg and aldactone 25 mg with low BP BMET f/u 06/20/18 BUN 23 Cr .84 K 4.4   Doing well with no symptoms of CHF Entresto dose increased 11/03/18  Long discussion about optimizing meds . 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    Past Medical History:  Diagnosis Date  . Anxiety   . Gastric ulcer   . GERD (gastroesophageal reflux disease)   . Guillain Barr syndrome (HCC) 1984   COMPLETE RECOVERY; triggered from flu illness  . Headache(784.0)    hx of tension-none recent  . Hematemesis  09/07/2016  . History of kidney stones 09/19/12   left ureteral stone-SURGERY 8/6 URETEROSCOPY / STENT PLACEMENT   . Partial gastric outlet obstruction   . UGIB (upper gastrointestinal bleed) 09/07/2016   Past Surgical History:  Procedure Laterality Date  . BALLOON DILATION N/A 11/15/2016   Procedure: BALLOON DILATION;  Surgeon: Benancio Deeds, MD;  Location: [facility];  Service: Gastroenterology;  Laterality: N/A;  . CYSTO, LEFT RGP, DIAGNOSTIC URETEROSCOPY AND STENT PLACEMENT  09/24/2012  . CYSTOSCOPY WITH RETROGRADE PYELOGRAM, URETEROSCOPY AND STENT PLACEMENT Left 09/24/2012   Procedure: CYSTOSCOPY WITH RETROGRADE PYELOGRAM, DIAGNOSTIC URETEROSCOPY AND STENT PLACEMENT;  Surgeon: Sebastian Ache, MD;  Location: [facility];  Service: Urology;  Laterality: Left;  . CYSTOSCOPY WITH RETROGRADE PYELOGRAM, URETEROSCOPY AND STENT PLACEMENT Left 10/08/2012   Procedure: CYSTOSCOPY WITH RETROGRADE PYELOGRAM, URETEROSCOPY AND STENT PLACEMENT;  Surgeon: Sebastian Ache, MD;  Location: [facility];  Service: Urology;  Laterality: Left;  1 HR NEEDS DIGITAL URETEROSCOPE   . ESOPHAGOGASTRODUODENOSCOPY N/A 09/08/2016   Procedure: ESOPHAGOGASTRODUODENOSCOPY (EGD);  Surgeon: Ruffin Frederick, MD;  Location: [facility];  Service: Gastroenterology;  Laterality: N/A;  . ESOPHAGOGASTRODUODENOSCOPY N/A 11/15/2016   Procedure: ESOPHAGOGASTRODUODENOSCOPY (EGD);  Surgeon: Benancio Deeds, MD;  Location: [facility];  Service: Gastroenterology;  Laterality: N/A;  . ESOPHAGOGASTRODUODENOSCOPY N/A 12/31/2016   Procedure: ESOPHAGOGASTRODUODENOSCOPY (EGD);  Surgeon: Benancio Deeds, MD;  Location: [facility];  Service: Gastroenterology;  Laterality: N/A;  . ESOPHAGOGASTRODUODENOSCOPY (EGD) WITH PROPOFOL N/A 10/15/2016  Procedure: ESOPHAGOGASTRODUODENOSCOPY (EGD) WITH PROPOFOL; Duodenal dilation;  Surgeon: Manus Gunning, MD;  Location: [facility];  Service: Gastroenterology;  Laterality: N/A;  .  HOLMIUM LASER APPLICATION Left 8/93/8101   Procedure: HOLMIUM LASER APPLICATION;  Surgeon: Alexis Frock, MD;  Location: [facility];  Service: Urology;  Laterality: Left;  . SAVORY DILATION N/A 12/31/2016   Procedure: POSSIBLE SAVORY DILATION;  Surgeon: Yetta Flock, MD;  Location: [facility];  Service: Gastroenterology;  Laterality: N/A;  . TONSILLECTOMY     t&a as a child  . wisdom teeth extracted       Current Meds  Medication Sig  . acetaminophen (TYLENOL) 500 MG tablet Take 1,000 mg 2 (two) times daily as needed by mouth for moderate pain.  . carvedilol (COREG) 12.5 MG tablet Take 1 tablet (12.5 mg total) by mouth 2 (two) times daily with a meal.  . clonazePAM (KLONOPIN) 0.5 MG tablet Take 0.5-1 tablets (0.25-0.5 mg total) by mouth daily as needed for anxiety.  Marland Kitchen MELATONIN PO Take 1 tablet by mouth at bedtime as needed (sleep).  Marland Kitchen omeprazole (PRILOSEC OTC) 20 MG tablet Take 20 mg by mouth daily.  . sacubitril-valsartan (ENTRESTO) 97-103 MG Take 1 tablet by mouth 2 (two) times daily.  Marland Kitchen spironolactone (ALDACTONE) 25 MG tablet Take 0.5 tablets (12.5 mg total) by mouth daily.  . [DISCONTINUED] carvedilol (COREG) 6.25 MG tablet Take 1 tablet (6.25 mg total) by mouth 2 (two) times daily with a meal.     Allergies:   Patient has no known allergies.   Social History   Tobacco Use  . Smoking status: Former Smoker    Packs/day: 1.50    Years: 28.00    Pack years: 42.00    Types: Cigarettes    Quit date: 05/02/2018    Years since quitting: 0.5  . Smokeless tobacco: Never Used  Substance Use Topics  . Alcohol use: Yes    Comment: occasional  . Drug use: No    Types: Marijuana    Comment: in the past     Family Hx: The patient's family history includes Congestive Heart Failure in his mother; Heart disease in his mother; Heart disease (age of onset: 8) in his brother; Hemachromatosis in his father; Rheum arthritis in his father; Ulcerative colitis in his father.  ROS:    Please see the history of present illness.     All other systems reviewed and are negative.   Prior CV studies:   The following studies were reviewed today:  Echo 06/06/18  Labs/Other Tests and Data Reviewed:    EKG:  ST rate 116 IVCD LVH  Recent Labs: 06/02/2018: ALT 15; TSH 1.44 06/07/2018: Hemoglobin 15.0; Platelets 274 10/31/2018: BUN 15; Creatinine, Ser 0.68; NT-Pro BNP 1,054; Potassium 4.0; Sodium 138   Recent Lipid Panel Lab Results  Component Value Date/Time   CHOL 203 (H) 06/02/2018 01:29 PM   TRIG 110.0 06/02/2018 01:29 PM   HDL 36.40 (L) 06/02/2018 01:29 PM   CHOLHDL 6 06/02/2018 01:29 PM   LDLCALC 145 (H) 06/02/2018 01:29 PM    Wt Readings from Last 3 Encounters:  11/21/18 181 lb (82.1 kg)  10/10/18 181 lb (82.1 kg)  06/23/18 170 lb 3.2 oz (77.2 kg)     Objective:    Vital Signs:  BP 110/74   Pulse 84   Ht 6' (1.829 m)   Wt 181 lb (82.1 kg)   SpO2 96%   BMI 24.55 kg/m    Affect appropriate Healthy:  appears  stated age HEENT: normal Neck supple with no adenopathy JVP normal no bruits no thyromegaly Lungs clear with no wheezing and good diaphragmatic motion Heart:  S1/S2 no murmur, no rub, gallop or click PMI  Enlarged  Abdomen: benighn, BS positve, no tenderness, no AAA no bruit.  No HSM or HJR Distal pulses intact with no bruits No edema Neuro non-focal Skin warm and dry No muscular weakness    ASSESSMENT & PLAN:    1. 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 Needs f/u echo for EF and optimization of meds.  Also needs evaluation for CAD discussed increasing  coreg Also discussed f/u cardiac CT to r/o CAD once meds titrated and further EP evaluation to risk stratify for AICD  2. Anxiety:  PRN Klonopin f/u primary 3. GI bleed history of on Prilosec F/U [facility]  COVID-19 Education: The signs and symptoms of COVID-19 were discussed with the patient and how to seek care for testing (follow up  with PCP or arrange E-visit).  The importance of social distancing was discussed today.  Time:   Today, I have spent 30 minutes with the patient with telehealth technology discussing the above problems.     Medication Adjustments/Labs and Tests Ordered:  Coreg increased to 12.5 mg bid  Tests Ordered:  BMET  Cardiac CTA Cardiac MRI  Medication Changes:  Increase Coreg   Disposition:  Follow up 4 weeks and EP 6 weeks   Signed, Charlton Haws, MD  11/21/2018 9:04 AM     [facility]

## 2018-11-21 ENCOUNTER — Encounter: Payer: Self-pay | Admitting: Cardiovascular Disease

## 2018-11-21 ENCOUNTER — Ambulatory Visit (INDEPENDENT_AMBULATORY_CARE_PROVIDER_SITE_OTHER): Payer: Managed Care, Other (non HMO) | Admitting: Cardiovascular Disease

## 2018-11-21 ENCOUNTER — Other Ambulatory Visit: Payer: Self-pay

## 2018-11-21 VITALS — BP 110/74 | HR 84 | Ht 72.0 in | Wt 181.0 lb

## 2018-11-21 DIAGNOSIS — I429 Cardiomyopathy, unspecified: Secondary | ICD-10-CM

## 2018-11-21 LAB — BASIC METABOLIC PANEL
BUN/Creatinine Ratio: 27 — ABNORMAL HIGH (ref 9–20)
BUN: 21 mg/dL (ref 6–24)
CO2: 23 mmol/L (ref 20–29)
Calcium: 9.6 mg/dL (ref 8.7–10.2)
Chloride: 103 mmol/L (ref 96–106)
Creatinine, Ser: 0.77 mg/dL (ref 0.76–1.27)
GFR calc Af Amer: 121 mL/min/{1.73_m2} (ref >59)
GFR calc non Af Amer: 105 mL/min/{1.73_m2} (ref >59)
Glucose: 93 mg/dL (ref 65–99)
Potassium: 4.2 mmol/L (ref 3.5–5.2)
Sodium: 140 mmol/L (ref 134–144)

## 2018-11-21 MED ORDER — CARVEDILOL 12.5 MG PO TABS: 12.5000 mg | ORAL_TABLET | Freq: Two times a day (BID) | ORAL | 3 refills | Status: DC

## 2018-11-21 NOTE — Patient Instructions (Addendum)
Medication Instructions:  Your physician has recommended you make the following change in your medication:  1-INCREASE Coreg 12.5 mg by mouth twice daily  If you need a refill on your cardiac medications before your next appointment, please call your pharmacy.   Lab work: Your physician recommends that you have lab work today- BMET  If you have labs (blood work) drawn today and your tests are completely normal, you will receive your results only by: Marland Kitchen MyChart Message (if you have MyChart) OR . A paper copy in the mail If you have any lab test that is abnormal or we need to change your treatment, we will call you to review the results.  Testing/Procedures: Your physician has requested that you have cardiac CT in 2 weeks. Cardiac computed tomography (CT) is a painless test that uses an x-ray machine to take clear, detailed pictures of your heart. For further information please visit HugeFiesta.[email or web address]. Please follow instruction sheet as given.   Your physician has requested that you have a cardiac MRI in 2 weeks. Cardiac MRI uses a computer to create images of your heart as its beating, producing both still and moving pictures of your heart and major blood vessels. For further information please visit [email or web address] Please follow the instruction sheet given to you today for more information.   Follow-Up: At [facility], you and your health needs are our priority.  As part of our continuing mission to provide you with exceptional heart care, we have created designated Provider Care Teams.  These Care Teams include your primary Cardiologist (physician) and Advanced Practice Providers (APPs -  Physician Assistants and Nurse Practitioners) who all work together to provide you with the care you need, when you need it. You will need a follow up appointment in 4 weeks after test .    You may see Jenkins Rouge, MD or one of the following Advanced Practice Providers on your designated Care Team:    Truitt Merle, NP Cecilie Kicks, NP . Kathyrn Drown, NP  You have been referred to Electrophysiology to discuss defibrillator in 8 weeks.

## 2018-11-24 ENCOUNTER — Ambulatory Visit: Payer: Managed Care, Other (non HMO)

## 2018-11-28 ENCOUNTER — Encounter: Payer: Self-pay | Admitting: *Deleted

## 2018-11-28 ENCOUNTER — Telehealth: Payer: Self-pay | Admitting: *Deleted

## 2018-11-28 NOTE — Telephone Encounter (Signed)
Left message regarding appointment for Cardiac MRI scheduled for 12/15/18 at 8:00 am at [facility].  Arrival time is 7:15 am at the 1st floor admissions office for registration.  Will also mail letter to patient with this information.

## 2018-12-11 ENCOUNTER — Other Ambulatory Visit: Payer: Self-pay | Admitting: Family Medicine

## 2018-12-12 ENCOUNTER — Telehealth (HOSPITAL_COMMUNITY): Payer: Self-pay | Admitting: Emergency Medicine

## 2018-12-12 MED ORDER — CLONAZEPAM 0.5 MG PO TABS: 0.2500 mg | ORAL_TABLET | Freq: Every day | ORAL | 0 refills | Status: DC | PRN

## 2018-12-12 NOTE — Telephone Encounter (Signed)
This is not a medicine that I want him taking daily. I will put in refill, but if needing this daily, we need to discuss alternatives, because this is not a great long term medication. Please help set up follow up visit if desired to discuss.

## 2018-12-12 NOTE — Telephone Encounter (Signed)
Left message for patient to call back  

## 2018-12-12 NOTE — Telephone Encounter (Signed)
Reaching out to patient to offer assistance regarding upcoming cardiac imaging study; pt verbalizes understanding of appt date/time, parking situation and where to check in, and verified current allergies; name and call back number provided for further questions should they arise Watson Robarge RN Navigator Cardiac Imaging [facility] [phone] office [phone] cell 

## 2018-12-12 NOTE — Telephone Encounter (Signed)
Does not need echo do MRI and CT

## 2018-12-12 NOTE — Telephone Encounter (Signed)
Patient called back. Informed him that MRI was to check his EF and CT was to check his coronary arteries. Patient verbalized understanding.

## 2018-12-15 ENCOUNTER — Ambulatory Visit (HOSPITAL_COMMUNITY)
Admission: RE | Admit: 2018-12-15 | Discharge: 2018-12-15 | Disposition: A | Discharge status: Home / Self Care | Payer: Managed Care, Other (non HMO) | Source: Ambulatory Visit | Attending: Cardiovascular Disease | Admitting: Cardiovascular Disease

## 2018-12-15 ENCOUNTER — Other Ambulatory Visit: Payer: Self-pay

## 2018-12-15 DIAGNOSIS — I429 Cardiomyopathy, unspecified: Secondary | ICD-10-CM | POA: Diagnosis present

## 2018-12-15 MED ORDER — GADOBUTROL 1 MMOL/ML IV SOLN
10.0000 mL | Freq: Once | INTRAVENOUS | Status: AC | PRN
  Administered 2018-12-15: 10 mL via INTRAVENOUS

## 2018-12-16 NOTE — Telephone Encounter (Signed)
Called the pt and informed him of the message below.  Appt scheduled for 10/30.

## 2018-12-18 ENCOUNTER — Other Ambulatory Visit: Payer: Self-pay

## 2018-12-18 DIAGNOSIS — R931 Abnormal findings on diagnostic imaging of heart and coronary circulation: Secondary | ICD-10-CM

## 2018-12-18 NOTE — Progress Notes (Signed)
Placed order for CHF clinic referral.

## 2018-12-19 ENCOUNTER — Other Ambulatory Visit: Payer: Self-pay

## 2018-12-19 ENCOUNTER — Telehealth (INDEPENDENT_AMBULATORY_CARE_PROVIDER_SITE_OTHER): Payer: Managed Care, Other (non HMO) | Admitting: Family Medicine

## 2018-12-19 ENCOUNTER — Encounter: Payer: Self-pay | Admitting: Family Medicine

## 2018-12-19 VITALS — BP 127/87 | HR 76 | Wt 178.6 lb

## 2018-12-19 DIAGNOSIS — F419 Anxiety disorder, unspecified: Secondary | ICD-10-CM | POA: Diagnosis not present

## 2018-12-19 DIAGNOSIS — Z8669 Personal history of other diseases of the nervous system and sense organs: Secondary | ICD-10-CM | POA: Diagnosis not present

## 2018-12-19 DIAGNOSIS — I5021 Acute systolic (congestive) heart failure: Secondary | ICD-10-CM | POA: Diagnosis not present

## 2018-12-19 NOTE — Progress Notes (Signed)
Virtual Visit via Video Note  I connected with Austin [name]  on 12/19/18 at  8:30 AM EDT by a video enabled telemedicine application and verified that I am speaking with the correct person using two identifiers.  Location patient: home Location provider:work or home office Persons participating in the virtual visit: patient, provider  I discussed the limitations of evaluation and management by telemedicine and the availability of in person appointments. The patient expressed understanding and agreed to proceed.   Austin [name] DOB: [birth year] Encounter date: 12/19/2018  This is a 53 y.o. male who presents with Chief Complaint  Patient presents with  . Follow-up    History of present illness: Systolic CHF: Following with cardiology.  On recent imaging, EF is still severely reduced with 24% ejection fraction and evidence of coronary artery disease/MI.  Patient has been scheduled for follow-up in the CHF clinic for right and left heart cath. States that he feels "good" but feels that he gets winded easily. Not exercising like he should. Not walking regularly. Has been traveling more for work; playing with band/practicing. Not having issues with breathing when he lays down to sleep. Monitoring weight which has been stable. Does do some walking when he can. 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   Work has been stressful; lost biggest account on Monday.   Patient asked to schedule follow-up visit today for anxiety.  Has had Klonopin for as needed use, but had refilled more regularly in recent months. Was doing pretty well until about a month and a half ago when he had cardiology follow up. Thought of defibrillator really made him anxious. Normally at end of day helps him slow mind down, focus. Doesn't take this in morning. Rarely in afternoon will take half if having issues concentrating for work.   Since quitting smoking sinuses have felt better. He has felt  pretty healthy.  Cannot get flu shot due to hx of Raynald Blend with flu illness.    No Known Allergies Current Meds  Medication Sig  . acetaminophen (TYLENOL) 500 MG tablet Take 1,000 mg 2 (two) times daily as needed by mouth for moderate pain.  . carvedilol (COREG) 12.5 MG tablet Take 1 tablet (12.5 mg total) by mouth 2 (two) times daily with a meal.  . clonazePAM (KLONOPIN) 0.5 MG tablet Take 0.5-1 tablets (0.25-0.5 mg total) by mouth daily as needed for anxiety.  Marland Kitchen MELATONIN PO Take 1 tablet by mouth at bedtime as needed (sleep).  Marland Kitchen omeprazole (PRILOSEC OTC) 20 MG tablet Take 20 mg by mouth daily.  . sacubitril-valsartan (ENTRESTO) 97-103 MG Take 1 tablet by mouth 2 (two) times daily.  Marland Kitchen spironolactone (ALDACTONE) 25 MG tablet Take 0.5 tablets (12.5 mg total) by mouth daily.    Review of Systems  Constitutional: Negative for chills, fatigue and fever.  Respiratory: Positive for shortness of breath. Negative for cough, chest tightness and wheezing.   Cardiovascular: Negative for chest pain, palpitations and leg swelling.  Psychiatric/Behavioral: Positive for decreased concentration (when anxious). Negative for sleep disturbance (does well with klonopin). The patient is nervous/anxious.     Objective:  BP 127/87   Pulse 76   Wt 178 lb 9.6 oz (81 kg)   BMI 24.22 kg/m   Weight: 178 lb 9.6 oz (81 kg)   BP Readings from Last 3 Encounters:  12/19/18 127/87  11/21/18 110/74  10/10/18 118/84   Wt Readings from Last 3 Encounters:  12/19/18 178 lb 9.6 oz (  81 kg)  11/21/18 181 lb (82.1 kg)  10/10/18 181 lb (82.1 kg)    EXAM:  GENERAL: alert, oriented, appears well and in no acute distress  HEENT: atraumatic, conjunctiva clear, no obvious abnormalities on inspection of external nose and ears  NECK: normal movements of the head and neck  LUNGS: on inspection no signs of respiratory distress, breathing rate appears normal, no obvious gross SOB, gasping or wheezing  CV: no  obvious cyanosis  MS: moves all visible extremities without noticeable abnormality  PSYCH/NEURO: pleasant and cooperative, no obvious depression or anxiety, speech and thought processing grossly intact   Assessment/Plan  1. Acute systolic heart failure [facility]) Following with cardiology today; calling them today to get cath set up that they recommended.   2. Anxiety Increased in recent months with work stress and stress over cardiac situation. Does well with the klonopin. Would like to keep using sparingly. We discussed that if worsening we can discuss medication like SSRI, but we will hold off on something new at this point.   3. History of Guillain-Barre syndrome Cannot get flu shot due to this.    Return for he will touch base with me when due for klonopin refill. We can set up next appointment at that time. Will need in office physical at some point, but needs to complete cardiology eval first.  I discussed the assessment and treatment plan with the patient. The patient was provided an opportunity to ask questions and all were answered. The patient agreed with the plan and demonstrated an understanding of the instructions.   The patient was advised to call back or seek an in-person evaluation if the symptoms worsen or if the condition fails to improve as anticipated.  I provided 25 minutes of non-face-to-face time during this encounter.   Theodis Shove, MD

## 2018-12-26 ENCOUNTER — Other Ambulatory Visit: Payer: Self-pay | Admitting: Family Medicine

## 2018-12-26 MED ORDER — CLONAZEPAM 0.5 MG PO TABS: 0.2500 mg | ORAL_TABLET | Freq: Every day | ORAL | 2 refills | Status: DC | PRN

## 2019-01-01 ENCOUNTER — Telehealth (HOSPITAL_COMMUNITY): Payer: Self-pay | Admitting: Emergency Medicine

## 2019-01-01 NOTE — Telephone Encounter (Signed)
Pt returning phone call regarding upcoming cardiac imaging study; pt verbalizes understanding of appt date/time, parking situation and where to check in, pre-test NPO status and medications ordered, and verified current allergies; name and call back number provided for further questions should they arise Shawntavia Saunders RN Navigator Cardiac Imaging [facility] [phone] office [phone] cell   

## 2019-01-01 NOTE — Telephone Encounter (Signed)
Left message on voicemail with name and callback number Matricia Begnaud RN Navigator Cardiac Imaging [facility] [phone] Office [phone] Cell  

## 2019-01-05 ENCOUNTER — Ambulatory Visit (HOSPITAL_COMMUNITY)
Admission: RE | Admit: 2019-01-05 | Discharge: 2019-01-05 | Disposition: A | Discharge status: Home / Self Care | Payer: Managed Care, Other (non HMO) | Source: Ambulatory Visit | Attending: Cardiovascular Disease | Admitting: Cardiovascular Disease

## 2019-01-05 ENCOUNTER — Other Ambulatory Visit: Payer: Self-pay

## 2019-01-05 DIAGNOSIS — I429 Cardiomyopathy, unspecified: Secondary | ICD-10-CM | POA: Insufficient documentation

## 2019-01-05 MED ORDER — NITROGLYCERIN 0.4 MG SL SUBL
0.8000 mg | SUBLINGUAL_TABLET | Freq: Once | SUBLINGUAL | Status: AC
  Administered 2019-01-05: 12:00:00 0.8 mg via SUBLINGUAL

## 2019-01-05 MED ORDER — NITROGLYCERIN 0.4 MG SL SUBL
SUBLINGUAL_TABLET | SUBLINGUAL | Status: AC
  Filled 2019-01-05: qty 2

## 2019-01-05 MED ORDER — IOHEXOL 350 MG/ML SOLN
80.0000 mL | Freq: Once | INTRAVENOUS | Status: AC | PRN
  Administered 2019-01-05: 12:00:00 80 mL via INTRAVENOUS

## 2019-01-06 NOTE — Progress Notes (Signed)
Date:  01/09/2019   ID:  Austin [name], DOB [birth year], MRN [id number]  Provider Location: Office  PCP:  Wynn Banker, MD  Cardiologist:  Charlton Haws, MD   Electrophysiologist:  None   Evaluation Performed:  Follow-Up Visit  Chief Complaint:   CHF/Dyspnea  History of Present Illness:    54 y.o. first seen consult 06/07/18 for acute CHF. Admitted 06/05/2018 with increasing shortness of breath.  Other medical issues include a history of GI bleeding in 2018, and anxiety.  The patient reportedly had been noticing increasing shortness of breath for the last few months.  In the emergency room he was noted to be hypoxic with a PO2 of 88. He was placed on BiPAP. Chest x-ray showed bilateral interstitial infiltrates and curly B-lines.  CT angiogram was negative for pulmonary embolism.  His BNP was 554, his troponin have been negative x3.  Echocardiogram showed significant dilated LV dysfunction with an ejection fraction of 20% and diffuse hypokinesis with inferior akinesis.  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 Cardiac CT done 11/16 with CAD and FFR CT positive in PLB, OM and mid LAD Here today to arrange cath to assess ischemic DCM  Risks including stroke bleeding MI, contrast reaction and need for emergency surgery discussed willing to proceed  Anxious but willing to proceed   The patient does not have symptoms concerning for COVID-19 infection (fever, chills, cough, or new shortness of breath).    Past Medical History:  Diagnosis Date  . Anxiety   . Gastric ulcer   . GERD (gastroesophageal reflux disease)   . Guillain Barr syndrome (HCC) 1984   COMPLETE RECOVERY; triggered from flu illness  . Headache(784.0)    hx of tension-none recent  . Hematemesis  09/07/2016  . History of kidney stones 09/19/12   left ureteral stone-SURGERY 8/6 URETEROSCOPY / STENT PLACEMENT   . Partial gastric outlet obstruction   . UGIB (upper gastrointestinal bleed) 09/07/2016   Past Surgical History:  Procedure Laterality Date  . BALLOON DILATION N/A 11/15/2016   Procedure: BALLOON DILATION;  Surgeon: Benancio Deeds, MD;  Location: [facility];  Service: Gastroenterology;  Laterality: N/A;  . CYSTO, LEFT RGP, DIAGNOSTIC URETEROSCOPY AND STENT PLACEMENT  09/24/2012  . CYSTOSCOPY WITH RETROGRADE PYELOGRAM, URETEROSCOPY AND STENT PLACEMENT Left 09/24/2012   Procedure: CYSTOSCOPY WITH RETROGRADE PYELOGRAM, DIAGNOSTIC URETEROSCOPY AND STENT PLACEMENT;  Surgeon: Sebastian Ache, MD;  Location: [facility];  Service: Urology;  Laterality: Left;  . CYSTOSCOPY WITH RETROGRADE PYELOGRAM, URETEROSCOPY AND STENT PLACEMENT Left 10/08/2012   Procedure: CYSTOSCOPY WITH RETROGRADE PYELOGRAM, URETEROSCOPY AND STENT PLACEMENT;  Surgeon: Sebastian Ache, MD;  [facility];  Service: Urology;  Laterality: Left;  1 HR NEEDS DIGITAL URETEROSCOPE   . ESOPHAGOGASTRODUODENOSCOPY N/A 09/08/2016   Procedure: ESOPHAGOGASTRODUODENOSCOPY (EGD);  Surgeon: Ruffin Frederick, MD;  Location: [facility];  Service: Gastroenterology;  Laterality: N/A;  . ESOPHAGOGASTRODUODENOSCOPY N/A 11/15/2016   Procedure: ESOPHAGOGASTRODUODENOSCOPY (EGD);  Surgeon: Benancio Deeds, MD;  Location: [facility];  Service: Gastroenterology;  Laterality: N/A;  . ESOPHAGOGASTRODUODENOSCOPY N/A 12/31/2016   Procedure: ESOPHAGOGASTRODUODENOSCOPY (EGD);  Surgeon: Benancio Deeds, MD;  Location: [facility];  Service: Gastroenterology;  Laterality: N/A;  . ESOPHAGOGASTRODUODENOSCOPY (EGD) WITH PROPOFOL N/A 10/15/2016   Procedure: ESOPHAGOGASTRODUODENOSCOPY (EGD)  WITH PROPOFOL; Duodenal dilation;  Surgeon: Manus Gunning, MD;  Location: [facility];  Service: Gastroenterology;  Laterality: N/A;  .  HOLMIUM LASER APPLICATION Left 3/84/5364   Procedure: HOLMIUM LASER APPLICATION;  Surgeon: Alexis Frock, MD;  [facility];  Service: Urology;  Laterality: Left;  . SAVORY DILATION N/A 12/31/2016   Procedure: POSSIBLE SAVORY DILATION;  Surgeon: Yetta Flock, MD;  [facility];  Service: Gastroenterology;  Laterality: N/A;  . TONSILLECTOMY     t&a as a child  . wisdom teeth extracted       Current Meds  Medication Sig  . acetaminophen (TYLENOL) 500 MG tablet Take 1,000 mg 2 (two) times daily as needed by mouth for moderate pain.  . carvedilol (COREG) 12.5 MG tablet Take 1 tablet (12.5 mg total) by mouth 2 (two) times daily with a meal.  . clonazePAM (KLONOPIN) 0.5 MG tablet Take 0.5-1 tablets (0.25-0.5 mg total) by mouth daily as needed for anxiety.  Marland Kitchen MELATONIN PO Take 1 tablet by mouth at bedtime as needed (sleep).  Marland Kitchen omeprazole (PRILOSEC OTC) 20 MG tablet Take 20 mg by mouth daily.  . sacubitril-valsartan (ENTRESTO) 97-103 MG Take 1 tablet by mouth 2 (two) times daily.  Marland Kitchen spironolactone (ALDACTONE) 25 MG tablet Take 0.5 tablets (12.5 mg total) by mouth daily.     Allergies:   Patient has no known allergies.   Social History   Tobacco Use  . Smoking status: Former Smoker    Packs/day: 1.50    Years: 28.00    Pack years: 42.00    Types: Cigarettes    Quit date: 05/02/2018    Years since quitting: 0.6  . Smokeless tobacco: Never Used  Substance Use Topics  . Alcohol use: Yes    Comment: occasional  . Drug use: No    Types: Marijuana    Comment: in the past     Family Hx: The patient's family history includes Congestive Heart Failure in his mother; Heart disease in his mother; Heart disease (age of onset: 22) in his brother; Hemachromatosis in his father; Rheum arthritis in his father; Ulcerative colitis in his father.  ROS:   Please see the history of present illness.     All other systems reviewed and are negative.   Prior CV studies:   The  following studies were reviewed today:  Echo 06/06/18  Labs/Other Tests and Data Reviewed:    EKG:  ST rate 116 IVCD LVH  Recent Labs: 06/02/2018: ALT 15; TSH 1.44 06/07/2018: Hemoglobin 15.0; Platelets 274 10/31/2018: NT-Pro BNP 1,054 11/21/2018: BUN 21; Creatinine, Ser 0.77; Potassium 4.2; Sodium 140   Recent Lipid Panel Lab Results  Component Value Date/Time   CHOL 203 (H) 06/02/2018 01:29 PM   TRIG 110.0 06/02/2018 01:29 PM   HDL 36.40 (L) 06/02/2018 01:29 PM   CHOLHDL 6 06/02/2018 01:29 PM   LDLCALC 145 (H) 06/02/2018 01:29 PM    Wt Readings from Last 3 Encounters:  01/09/19 182 lb 12.8 oz (82.9 kg)  12/19/18 178 lb 9.6 oz (81 kg)  11/21/18 181 lb (82.1 kg)     Objective:    Vital Signs:  BP 116/72   Pulse 66   Ht 6' (1.829 m)   Wt 182 lb 12.8 oz (82.9 kg)   SpO2 97%   BMI 24.79 kg/m    Affect appropriate Healthy:  appears stated age HEENT: normal Neck supple with no adenopathy JVP normal no bruits no thyromegaly Lungs clear with no wheezing and  good diaphragmatic motion Heart:  S1/S2 no murmur, no rub, gallop or click PMI  Enlarged  Abdomen: benighn, BS positve, no tenderness, no AAA no bruit.  No HSM or HJR Distal pulses intact with no bruits No edema Neuro non-focal Skin warm and dry No muscular weakness    ASSESSMENT & PLAN:    1. Acute Systolic CHF:  TTE 06/06/18 EF 56% diffuse hypokinesis F/U myovue and cardiac CT suggest ischemic DCM right and left cath to define anatomy and assess filling pressures on optimized medical Rx  2. Anxiety:  PRN Klonopin f/u primary 3. GI bleed history of on Prilosec F/U [facility]  COVID-19 Education: The signs and symptoms of COVID-19 were discussed with the patient and how to seek care for testing (follow up with PCP or arrange E-visit).  The importance of social distancing was discussed today.  Time:   Today, I have spent 30 minutes with the patient with telehealth technology discussing the above problems.      Medication Adjustments/Labs and Tests Ordered:  None   Tests Ordered:  Pre Cath   Medication Changes:  None   Disposition:  Follow up post cath if no intervention will need EP evaluation for AICD   Signed, Charlton Haws, MD  01/09/2019 8:41 AM    [facility]

## 2019-01-06 NOTE — H&P (View-Only) (Signed)
  Date:  01/09/2019   ID:  Austin [name], DOB 07/18/1965, MRN [id number]  Provider Location: Office  PCP:  Koberlein, Junell C, MD  Cardiologist:  Roseland Braun, MD   Electrophysiologist:  None   Evaluation Performed:  Follow-Up Visit  Chief Complaint:   CHF/Dyspnea  History of Present Illness:    53 y.o. first seen consult 06/07/18 for acute CHF. Admitted 06/05/2018 with increasing shortness of breath.  Other medical issues include a history of GI bleeding in 2018, and anxiety.  The patient reportedly had been noticing increasing shortness of breath for the last few months.  In the emergency room he was noted to be hypoxic with a PO2 of 88. He was placed on BiPAP. Chest x-ray showed bilateral interstitial infiltrates and curly B-lines.  CT angiogram was negative for pulmonary embolism.  His BNP was 554, his troponin have been negative x3.  Echocardiogram showed significant dilated LV dysfunction with an ejection fraction of 20% and diffuse hypokinesis with inferior akinesis.  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 Cardiac CT done 11/16 with CAD and FFR CT positive in PL[facility], OM and mid LAD Here today to arrange cath to assess ischemic DCM  Risks including stroke bleeding MI, contrast reaction and need for emergency surgery discussed willing to proceed  Anxious but willing to proceed   The patient does not have symptoms concerning for COVID-19 infection (fever, chills, cough, or new shortness of breath).    Past Medical History:  Diagnosis Date  . Anxiety   . Gastric ulcer   . GERD (gastroesophageal reflux disease)   . Guillain Barr syndrome (HCC) 1984   COMPLETE RECOVERY; triggered from flu illness  . Headache(784.0)    hx of tension-none recent  . Hematemesis  09/07/2016  . History of kidney stones 09/19/12   left ureteral stone-SURGERY 8/6 URETEROSCOPY / STENT PLACEMENT   . Partial gastric outlet obstruction   . UGIB (upper gastrointestinal bleed) 09/07/2016   Past Surgical History:  Procedure Laterality Date  . BALLOON DILATION N/A 11/15/2016   Procedure: BALLOON DILATION;  Surgeon: Armbruster, Steven P, MD;  [facility]: [facility];  Service: Gastroenterology;  Laterality: N/A;  . CYSTO, LEFT RGP, DIAGNOSTIC URETEROSCOPY AND STENT PLACEMENT  09/24/2012  . CYSTOSCOPY WITH RETROGRADE PYELOGRAM, URETEROSCOPY AND STENT PLACEMENT Left 09/24/2012   Procedure: CYSTOSCOPY WITH RETROGRADE PYELOGRAM, DIAGNOSTIC URETEROSCOPY AND STENT PLACEMENT;  Surgeon: Theodore Manny, MD;  Location[facility];  Service: Urology;  Laterality: Left;  . CYSTOSCOPY WITH RETROGRADE PYELOGRAM, URETEROSCOPY AND STENT PLACEMENT Left 10/08/2012   Procedure: CYSTOSCOPY WITH RETROGRADE PYELOGRAM, URETEROSCOPY AND STENT PLACEMENT;  Surgeon: Theodore Manny, MD;  Location[facility];  Service: Urology;  Laterality: Left;  1 HR NEEDS DIGITAL URETEROSCOPE   . ESOPHAGOGASTRODUODENOSCOPY N/A 09/08/2016   Procedure: ESOPHAGOGASTRODUODENOSCOPY (EGD);  Surgeon: Armbruster, Steven Paul, MD;  [facility];  Service: Gastroenterology;  Laterality: N/A;  . ESOPHAGOGASTRODUODENOSCOPY N/A 11/15/2016   Procedure: ESOPHAGOGASTRODUODENOSCOPY (EGD);  Surgeon: Armbruster, Steven P, MD;  [facility];  Service: Gastroenterology;  Laterality: N/A;  . ESOPHAGOGASTRODUODENOSCOPY N/A 12/31/2016   Procedure: ESOPHAGOGASTRODUODENOSCOPY (EGD);  Surgeon: Armbruster, Steven P, MD;  [facility];  Service: Gastroenterology;  Laterality: N/A;  . ESOPHAGOGASTRODUODENOSCOPY (EGD) WITH PROPOFOL N/A 10/15/2016   Procedure: ESOPHAGOGASTRODUODENOSCOPY (EGD)   WITH PROPOFOL; Duodenal dilation;  Surgeon: Manus Gunning, MD;  Location: [facility];  Service: Gastroenterology;  Laterality: N/A;  .  HOLMIUM LASER APPLICATION Left 3/84/5364   Procedure: HOLMIUM LASER APPLICATION;  Surgeon: Alexis Frock, MD;  [facility];  Service: Urology;  Laterality: Left;  . SAVORY DILATION N/A 12/31/2016   Procedure: POSSIBLE SAVORY DILATION;  Surgeon: Yetta Flock, MD;  [facility];  Service: Gastroenterology;  Laterality: N/A;  . TONSILLECTOMY     t&a as a child  . wisdom teeth extracted       Current Meds  Medication Sig  . acetaminophen (TYLENOL) 500 MG tablet Take 1,000 mg 2 (two) times daily as needed by mouth for moderate pain.  . carvedilol (COREG) 12.5 MG tablet Take 1 tablet (12.5 mg total) by mouth 2 (two) times daily with a meal.  . clonazePAM (KLONOPIN) 0.5 MG tablet Take 0.5-1 tablets (0.25-0.5 mg total) by mouth daily as needed for anxiety.  Marland Kitchen MELATONIN PO Take 1 tablet by mouth at bedtime as needed (sleep).  Marland Kitchen omeprazole (PRILOSEC OTC) 20 MG tablet Take 20 mg by mouth daily.  . sacubitril-valsartan (ENTRESTO) 97-103 MG Take 1 tablet by mouth 2 (two) times daily.  Marland Kitchen spironolactone (ALDACTONE) 25 MG tablet Take 0.5 tablets (12.5 mg total) by mouth daily.     Allergies:   Patient has no known allergies.   Social History   Tobacco Use  . Smoking status: Former Smoker    Packs/day: 1.50    Years: 28.00    Pack years: 42.00    Types: Cigarettes    Quit date: 05/02/2018    Years since quitting: 0.6  . Smokeless tobacco: Never Used  Substance Use Topics  . Alcohol use: Yes    Comment: occasional  . Drug use: No    Types: Marijuana    Comment: in the past     Family Hx: The patient's family history includes Congestive Heart Failure in his mother; Heart disease in his mother; Heart disease (age of onset: 22) in his brother; Hemachromatosis in his father; Rheum arthritis in his father; Ulcerative colitis in his father.  ROS:   Please see the history of present illness.     All other systems reviewed and are negative.   Prior CV studies:   The  following studies were reviewed today:  Echo 06/06/18  Labs/Other Tests and Data Reviewed:    EKG:  ST rate 116 IVCD LVH  Recent Labs: 06/02/2018: ALT 15; TSH 1.44 06/07/2018: Hemoglobin 15.0; Platelets 274 10/31/2018: NT-Pro BNP 1,054 11/21/2018: BUN 21; Creatinine, Ser 0.77; Potassium 4.2; Sodium 140   Recent Lipid Panel Lab Results  Component Value Date/Time   CHOL 203 (H) 06/02/2018 01:29 PM   TRIG 110.0 06/02/2018 01:29 PM   HDL 36.40 (L) 06/02/2018 01:29 PM   CHOLHDL 6 06/02/2018 01:29 PM   LDLCALC 145 (H) 06/02/2018 01:29 PM    Wt Readings from Last 3 Encounters:  01/09/19 182 lb 12.8 oz (82.9 kg)  12/19/18 178 lb 9.6 oz (81 kg)  11/21/18 181 lb (82.1 kg)     Objective:    Vital Signs:  BP 116/72   Pulse 66   Ht 6' (1.829 m)   Wt 182 lb 12.8 oz (82.9 kg)   SpO2 97%   BMI 24.79 kg/m    Affect appropriate Healthy:  appears stated age HEENT: normal Neck supple with no adenopathy JVP normal no bruits no thyromegaly Lungs clear with no wheezing and  good diaphragmatic motion Heart:  S1/S2 no murmur, no rub, gallop or click PMI  Enlarged  Abdomen: benighn, BS positve, no tenderness, no AAA no bruit.  No HSM or HJR Distal pulses intact with no bruits No edema Neuro non-focal Skin warm and dry No muscular weakness    ASSESSMENT & PLAN:    1. Acute Systolic CHF:  TTE 06/06/18 EF 56% diffuse hypokinesis F/U myovue and cardiac CT suggest ischemic DCM right and left cath to define anatomy and assess filling pressures on optimized medical Rx  2. Anxiety:  PRN Klonopin f/u primary 3. GI bleed history of on Prilosec F/U [facility]  COVID-19 Education: The signs and symptoms of COVID-19 were discussed with the patient and how to seek care for testing (follow up with PCP or arrange E-visit).  The importance of social distancing was discussed today.  Time:   Today, I have spent 30 minutes with the patient with telehealth technology discussing the above problems.      Medication Adjustments/Labs and Tests Ordered:  None   Tests Ordered:  Pre Cath   Medication Changes:  None   Disposition:  Follow up post cath if no intervention will need EP evaluation for AICD   Signed, Charlton Haws, MD  01/09/2019 8:41 AM    [facility]

## 2019-01-09 ENCOUNTER — Ambulatory Visit (INDEPENDENT_AMBULATORY_CARE_PROVIDER_SITE_OTHER): Payer: Managed Care, Other (non HMO) | Admitting: Cardiovascular Disease

## 2019-01-09 ENCOUNTER — Encounter: Payer: Self-pay | Admitting: Cardiovascular Disease

## 2019-01-09 ENCOUNTER — Other Ambulatory Visit: Payer: Self-pay

## 2019-01-09 VITALS — BP 116/72 | HR 66 | Ht 72.0 in | Wt 182.8 lb

## 2019-01-09 DIAGNOSIS — I5021 Acute systolic (congestive) heart failure: Secondary | ICD-10-CM | POA: Diagnosis not present

## 2019-01-09 DIAGNOSIS — I38 Endocarditis, valve unspecified: Secondary | ICD-10-CM

## 2019-01-09 DIAGNOSIS — I509 Heart failure, unspecified: Secondary | ICD-10-CM | POA: Diagnosis not present

## 2019-01-09 LAB — CBC WITH DIFFERENTIAL/PLATELET
Basophils Absolute: 0.1 10*3/uL (ref 0.0–0.2)
Basos: 1 %
EOS (ABSOLUTE): 0.1 10*3/uL (ref 0.0–0.4)
Eos: 2 %
Hematocrit: 43.4 % (ref 37.5–51.0)
Hemoglobin: 15.5 g/dL (ref 13.0–17.7)
Immature Grans (Abs): 0 10*3/uL (ref 0.0–0.1)
Immature Granulocytes: 0 %
Lymphocytes Absolute: 2 10*3/uL (ref 0.7–3.1)
Lymphs: 27 %
MCH: 34 pg — ABNORMAL HIGH (ref 26.6–33.0)
MCHC: 35.7 g/dL (ref 31.5–35.7)
MCV: 95 fL (ref 79–97)
Monocytes Absolute: 0.6 10*3/uL (ref 0.1–0.9)
Monocytes: 8 %
Neutrophils Absolute: 4.8 10*3/uL (ref 1.4–7.0)
Neutrophils: 62 %
Platelets: 258 10*3/uL (ref 150–450)
RBC: 4.56 x10E6/uL (ref 4.14–5.80)
RDW: 12.4 % (ref 11.6–15.4)
WBC: 7.6 10*3/uL (ref 3.4–10.8)

## 2019-01-09 LAB — BASIC METABOLIC PANEL
BUN/Creatinine Ratio: 31 — ABNORMAL HIGH (ref 9–20)
BUN: 21 mg/dL (ref 6–24)
CO2: 22 mmol/L (ref 20–29)
Calcium: 9.7 mg/dL (ref 8.7–10.2)
Chloride: 102 mmol/L (ref 96–106)
Creatinine, Ser: 0.68 mg/dL — ABNORMAL LOW (ref 0.76–1.27)
GFR calc Af Amer: 128 mL/min/{1.73_m2} (ref >59)
GFR calc non Af Amer: 111 mL/min/{1.73_m2} (ref >59)
Glucose: 97 mg/dL (ref 65–99)
Potassium: 4.4 mmol/L (ref 3.5–5.2)
Sodium: 141 mmol/L (ref 134–144)

## 2019-01-09 NOTE — Patient Instructions (Addendum)
Medication Instructions:   *If you need a refill on your cardiac medications before your next appointment, please call your pharmacy*  Lab Work: Your physician recommends that you have lab work today. BMET and CBC  If you have labs (blood work) drawn today and your tests are completely normal, you will receive your results only by: Marland Kitchen MyChart Message (if you have MyChart) OR . A paper copy in the mail If you have any lab test that is abnormal or we need to change your treatment, we will call you to review the results.  Testing/Procedures: Your physician has requested that you have a cardiac catheterization. Cardiac catheterization is used to diagnose and/or treat various heart conditions. Doctors may recommend this procedure for a number of different reasons. The most common reason is to evaluate chest pain. Chest pain can be a symptom of coronary artery disease (CAD), and cardiac catheterization can show whether plaque is narrowing or blocking your heart's arteries. This procedure is also used to evaluate the valves, as well as measure the blood flow and oxygen levels in different parts of your heart. For further information please visit [email or web address] Please follow instruction sheet, as given.  Follow-Up: At [facility], you and your health needs are our priority.  As part of our continuing mission to provide you with exceptional heart care, we have created designated Provider Care Teams.  These Care Teams include your primary Cardiologist (physician) and Advanced Practice Providers (APPs -  Physician Assistants and Nurse Practitioners) who all work together to provide you with the care you need, when you need it.  Your next appointment:   4 weeks  The format for your next appointment:   In Person  Provider:   You may see Charlton Haws, MD or one of the following Advanced Practice Providers on your designated Care Team:    Norma Fredrickson, NP  Nada Boozer, NP  Georgie Chard,  NP     [facility] CARDIOVASCULAR DIVISION [facility] ST OFFICE [address] [place] [address] Dept: [phone] Loc: [phone]  Rahkeem Senft  01/09/2019  You are scheduled for a Cardiac Catheterization on Wednesday, December 2 with Dr. Verne Carrow.  1. Please arrive at the [facility] (Main Entrance A) at [facility]: [address] [place], [place] [address] at 6:30 AM (This time is two hours before your procedure to ensure your preparation). Free valet parking service is available.   Special note: Every effort is made to have your procedure done on time. Please understand that emergencies sometimes delay scheduled procedures.  2. Diet: Do not eat solid foods after midnight.  The patient may have clear liquids until 5am upon the day of the procedure.  3. Labs: You will need to have blood drawn on Friday, November 20 at [facility]. [address], [place]  Open: 7:30am - 5pm    Phone: [phone]. You do not need to be fasting.  4. Medication instructions in preparation for your procedure:   Contrast Allergy: No  Stop taking, Entresto Wednesday, December 2,, Aldactone Wednesday, December 2,  On the morning of your procedure, take your Aspirin and any morning medicines NOT listed above.  You may use sips of water.  5. Plan for one night stay--bring personal belongings. 6. Bring a current list of your medications and current insurance cards. 7. You MUST have a responsible person to drive you home. 8. Someone MUST  be with you the first 24 hours after you arrive home or your discharge will be delayed. 9. Please wear clothes that are easy to get on and off and wear slip-on shoes.  Thank you for allowing [place] to care for you!   -- [facility]   Your Pre-procedure COVID-19 Testing will be done on Saturday, November 28th  at [facility] [place] at [address], [place], [place] [address]. Once you arrive at the testing site, stay in the right hand lane, go under the building overhang not the tent. If you are tested under the tent your results may not be back before your procedure. Please be on time for your appointment.  After your swab you will be given a mask to wear and instructed to go home and quarantine/no visitors until after your procedure. If you test positive you will be notified and your procedure will be cancelled.

## 2019-01-11 ENCOUNTER — Other Ambulatory Visit: Payer: Self-pay | Admitting: Cardiovascular Disease

## 2019-01-11 DIAGNOSIS — I5022 Chronic systolic (congestive) heart failure: Secondary | ICD-10-CM

## 2019-01-11 MED ORDER — SODIUM CHLORIDE 0.9% FLUSH: 3.0000 mL | Freq: Two times a day (BID) | INTRAVENOUS | Status: DC

## 2019-01-17 ENCOUNTER — Other Ambulatory Visit (HOSPITAL_COMMUNITY)
Admission: RE | Admit: 2019-01-17 | Discharge: 2019-01-17 | Disposition: A | Discharge status: Home / Self Care | Payer: Managed Care, Other (non HMO) | Source: Ambulatory Visit | Attending: Cardiovascular Disease | Admitting: Cardiovascular Disease

## 2019-01-17 DIAGNOSIS — Z01812 Encounter for preprocedural laboratory examination: Secondary | ICD-10-CM | POA: Diagnosis not present

## 2019-01-17 DIAGNOSIS — Z20828 Contact with and (suspected) exposure to other viral communicable diseases: Secondary | ICD-10-CM | POA: Insufficient documentation

## 2019-01-18 LAB — NOVEL CORONAVIRUS, NAA (HOSP ORDER, SEND-OUT TO REF LAB; TAT 18-24 HRS): SARS-CoV-2, NAA: NOT DETECTED

## 2019-01-19 ENCOUNTER — Institutional Professional Consult (permissible substitution): Payer: Managed Care, Other (non HMO) | Admitting: Internal Medicine

## 2019-01-20 ENCOUNTER — Telehealth: Payer: Self-pay | Admitting: *Deleted

## 2019-01-20 NOTE — Telephone Encounter (Signed)
Pt contacted pre-catheterization scheduled at [facility] for: Wednesday January 21, 2019 8:30 AM Verified arrival time and place: [facility]) at: 6:30 AM   No solid food after midnight prior to cath, clear liquids until 5 AM day of procedure. Contrast allergy: no  Hold: Spironolactone-AM of procedure.  Except hold medications AM meds can be  taken pre-cath with sip of water including: ASA 81 mg   Confirmed patient has responsible adult to drive home post procedure and observe 24 hours after arriving home: yes  Currently, due to Covid-19 pandemic, only one support person will be allowed with patient. Must be the same support person for that patient's entire stay, will be screened and required to wear a mask. They will be asked to wait in the waiting room for the duration of the patient's stay.  Patients are required to wear a mask when they enter the hospital.      COVID-19 Pre-Screening Questions:  . In the past 7 to 10 days have you had a cough,  shortness of breath, headache, congestion, fever (100 or greater) body aches, chills, sore throat, or sudden loss of taste or sense of smell? no . Have you been around anyone with known Covid 19? no . Have you been around anyone who is awaiting Covid 19 test results in the past 7 to 10 days? no . Have you been around anyone who has been exposed to Covid 19, or has mentioned symptoms of Covid 19 within the past 7 to 10 days? no   I reviewed procedure/mask/visitor instructions with patient, he verbalized understanding, thanked me for call.

## 2019-01-21 ENCOUNTER — Other Ambulatory Visit: Payer: Self-pay

## 2019-01-21 ENCOUNTER — Ambulatory Visit (HOSPITAL_COMMUNITY)
Admission: RE | Admit: 2019-01-21 | Discharge: 2019-01-21 | Disposition: A | Discharge status: Home / Self Care | Payer: Managed Care, Other (non HMO) | Source: Ambulatory Visit | Attending: Cardiovascular Disease | Admitting: Cardiovascular Disease

## 2019-01-21 ENCOUNTER — Encounter (HOSPITAL_COMMUNITY)
Admission: RE | Disposition: A | Discharge status: Home / Self Care | Payer: Managed Care, Other (non HMO) | Source: Ambulatory Visit | Attending: Cardiovascular Disease

## 2019-01-21 DIAGNOSIS — K219 Gastro-esophageal reflux disease without esophagitis: Secondary | ICD-10-CM | POA: Diagnosis not present

## 2019-01-21 DIAGNOSIS — Z8619 Personal history of other infectious and parasitic diseases: Secondary | ICD-10-CM | POA: Insufficient documentation

## 2019-01-21 DIAGNOSIS — I5022 Chronic systolic (congestive) heart failure: Secondary | ICD-10-CM | POA: Insufficient documentation

## 2019-01-21 DIAGNOSIS — Z87891 Personal history of nicotine dependence: Secondary | ICD-10-CM | POA: Diagnosis not present

## 2019-01-21 DIAGNOSIS — I25110 Atherosclerotic heart disease of native coronary artery with unstable angina pectoris: Secondary | ICD-10-CM | POA: Diagnosis not present

## 2019-01-21 DIAGNOSIS — Z79899 Other long term (current) drug therapy: Secondary | ICD-10-CM | POA: Diagnosis not present

## 2019-01-21 DIAGNOSIS — Z960 Presence of urogenital implants: Secondary | ICD-10-CM | POA: Diagnosis not present

## 2019-01-21 DIAGNOSIS — Z8249 Family history of ischemic heart disease and other diseases of the circulatory system: Secondary | ICD-10-CM | POA: Diagnosis not present

## 2019-01-21 DIAGNOSIS — F419 Anxiety disorder, unspecified: Secondary | ICD-10-CM | POA: Insufficient documentation

## 2019-01-21 DIAGNOSIS — I200 Unstable angina: Secondary | ICD-10-CM | POA: Diagnosis present

## 2019-01-21 DIAGNOSIS — Z955 Presence of coronary angioplasty implant and graft: Secondary | ICD-10-CM

## 2019-01-21 HISTORY — PX: CORONARY STENT INTERVENTION: CATH118234

## 2019-01-21 HISTORY — PX: RIGHT/LEFT HEART CATH AND CORONARY ANGIOGRAPHY: CATH118266

## 2019-01-21 LAB — POCT I-STAT 7, (LYTES, BLD GAS, ICA,H+H)
Bicarbonate: 25.6 mmol/L (ref 20.0–28.0)
Calcium, Ion: 1.23 mmol/L (ref 1.15–1.40)
HCT: 44 % (ref 39.0–52.0)
Hemoglobin: 15 g/dL (ref 13.0–17.0)
O2 Saturation: 94 %
Potassium: 3.8 mmol/L (ref 3.5–5.1)
Sodium: 138 mmol/L (ref 135–145)
TCO2: 27 mmol/L (ref 22–32)
pCO2 arterial: 44.5 mmHg (ref 32.0–48.0)
pH, Arterial: 7.368 (ref 7.350–7.450)
pO2, Arterial: 73 mmHg — ABNORMAL LOW (ref 83.0–108.0)

## 2019-01-21 LAB — POCT I-STAT EG7
Bicarbonate: 26.6 mmol/L (ref 20.0–28.0)
Calcium, Ion: 1.22 mmol/L (ref 1.15–1.40)
HCT: 44 % (ref 39.0–52.0)
Hemoglobin: 15 g/dL (ref 13.0–17.0)
O2 Saturation: 70 %
Potassium: 3.8 mmol/L (ref 3.5–5.1)
Sodium: 139 mmol/L (ref 135–145)
TCO2: 28 mmol/L (ref 22–32)
pCO2, Ven: 49.8 mmHg (ref 44.0–60.0)
pH, Ven: 7.336 (ref 7.250–7.430)
pO2, Ven: 39 mmHg (ref 32.0–45.0)

## 2019-01-21 LAB — POCT ACTIVATED CLOTTING TIME
Activated Clotting Time: 351 seconds
Activated Clotting Time: 571 seconds
Activated Clotting Time: 285 seconds

## 2019-01-21 SURGERY — RIGHT/LEFT HEART CATH AND CORONARY ANGIOGRAPHY
Anesthesia: LOCAL

## 2019-01-21 MED ORDER — LIDOCAINE HCL (PF) 1 % IJ SOLN
INTRAMUSCULAR | Status: AC
  Filled 2019-01-21: qty 30

## 2019-01-21 MED ORDER — FENTANYL CITRATE (PF) 100 MCG/2ML IJ SOLN
INTRAMUSCULAR | Status: DC | PRN
  Administered 2019-01-21 (×3): 25 ug via INTRAVENOUS
  Administered 2019-01-21: 50 ug via INTRAVENOUS

## 2019-01-21 MED ORDER — HEPARIN SODIUM (PORCINE) 1000 UNIT/ML IJ SOLN
INTRAMUSCULAR | Status: AC
  Filled 2019-01-21: qty 1

## 2019-01-21 MED ORDER — SODIUM CHLORIDE 0.9% FLUSH: 3.0000 mL | INTRAVENOUS | Status: DC | PRN

## 2019-01-21 MED ORDER — HYDRALAZINE HCL 20 MG/ML IJ SOLN: 10.0000 mg | INTRAMUSCULAR | Status: AC | PRN

## 2019-01-21 MED ORDER — NITROGLYCERIN 1 MG/10 ML FOR IR/CATH LAB
INTRA_ARTERIAL | Status: AC
  Filled 2019-01-21: qty 10

## 2019-01-21 MED ORDER — SPIRONOLACTONE 12.5 MG HALF TABLET: 12.5000 mg | ORAL_TABLET | Freq: Every day | ORAL | Status: DC

## 2019-01-21 MED ORDER — ACETAMINOPHEN 325 MG PO TABS: 650.0000 mg | ORAL_TABLET | ORAL | Status: DC | PRN

## 2019-01-21 MED ORDER — TICAGRELOR 90 MG PO TABS
ORAL_TABLET | ORAL | Status: DC | PRN
  Administered 2019-01-21: 180 mg via ORAL

## 2019-01-21 MED ORDER — ASPIRIN EC 81 MG PO TBEC: 81.0000 mg | DELAYED_RELEASE_TABLET | Freq: Every day | ORAL | 11 refills | Status: AC

## 2019-01-21 MED ORDER — SODIUM CHLORIDE 0.9 % IV SOLN: 250.0000 mL | INTRAVENOUS | Status: DC | PRN

## 2019-01-21 MED ORDER — ASPIRIN 81 MG PO CHEW: 81.0000 mg | CHEWABLE_TABLET | ORAL | Status: DC

## 2019-01-21 MED ORDER — MIDAZOLAM HCL 2 MG/2ML IJ SOLN
INTRAMUSCULAR | Status: AC
  Filled 2019-01-21: qty 2

## 2019-01-21 MED ORDER — HEPARIN (PORCINE) IN NACL 1000-0.9 UT/500ML-% IV SOLN
INTRAVENOUS | Status: AC
  Filled 2019-01-21: qty 1000

## 2019-01-21 MED ORDER — FENTANYL CITRATE (PF) 100 MCG/2ML IJ SOLN
INTRAMUSCULAR | Status: AC
  Filled 2019-01-21: qty 2

## 2019-01-21 MED ORDER — SODIUM CHLORIDE 0.9 % IV SOLN
INTRAVENOUS | Status: DC
  Administered 2019-01-21: 07:00:00 via INTRAVENOUS

## 2019-01-21 MED ORDER — SODIUM CHLORIDE 0.9% FLUSH: 3.0000 mL | Freq: Two times a day (BID) | INTRAVENOUS | Status: DC

## 2019-01-21 MED ORDER — ONDANSETRON HCL 4 MG/2ML IJ SOLN: 4.0000 mg | Freq: Four times a day (QID) | INTRAMUSCULAR | Status: DC | PRN

## 2019-01-21 MED ORDER — LABETALOL HCL 5 MG/ML IV SOLN: 10.0000 mg | INTRAVENOUS | Status: AC | PRN

## 2019-01-21 MED ORDER — HEPARIN SODIUM (PORCINE) 1000 UNIT/ML IJ SOLN
INTRAMUSCULAR | Status: DC | PRN
  Administered 2019-01-21: 4000 [IU] via INTRAVENOUS
  Administered 2019-01-21: 6000 [IU] via INTRAVENOUS

## 2019-01-21 MED ORDER — OMEPRAZOLE MAGNESIUM 20 MG PO TBEC: 20.0000 mg | DELAYED_RELEASE_TABLET | Freq: Every day | ORAL | Status: DC

## 2019-01-21 MED ORDER — TICAGRELOR 90 MG PO TABS: 90.0000 mg | ORAL_TABLET | Freq: Two times a day (BID) | ORAL | 6 refills | Status: DC

## 2019-01-21 MED ORDER — TICAGRELOR 90 MG PO TABS: 90.0000 mg | ORAL_TABLET | Freq: Two times a day (BID) | ORAL | Status: DC

## 2019-01-21 MED ORDER — SACUBITRIL-VALSARTAN 97-103 MG PO TABS: 1.0000 | ORAL_TABLET | Freq: Two times a day (BID) | ORAL | Status: DC

## 2019-01-21 MED ORDER — VERAPAMIL HCL 2.5 MG/ML IV SOLN
INTRAVENOUS | Status: AC
  Filled 2019-01-21: qty 2

## 2019-01-21 MED ORDER — HEPARIN (PORCINE) IN NACL 1000-0.9 UT/500ML-% IV SOLN
INTRAVENOUS | Status: DC | PRN
  Administered 2019-01-21 (×2): 500 mL

## 2019-01-21 MED ORDER — ASPIRIN 81 MG PO CHEW: 81.0000 mg | CHEWABLE_TABLET | Freq: Every day | ORAL | Status: DC

## 2019-01-21 MED ORDER — LIDOCAINE HCL (PF) 1 % IJ SOLN
INTRAMUSCULAR | Status: DC | PRN
  Administered 2019-01-21: 5 mL
  Administered 2019-01-21: 2 mL

## 2019-01-21 MED ORDER — SODIUM CHLORIDE 0.9 % IV SOLN: INTRAVENOUS | Status: AC

## 2019-01-21 MED ORDER — ATORVASTATIN CALCIUM 80 MG PO TABS: 80.0000 mg | ORAL_TABLET | Freq: Every day | ORAL | 11 refills | Status: DC

## 2019-01-21 MED ORDER — CARVEDILOL 12.5 MG PO TABS: 12.5000 mg | ORAL_TABLET | Freq: Two times a day (BID) | ORAL | Status: DC

## 2019-01-21 MED ORDER — MIDAZOLAM HCL 2 MG/2ML IJ SOLN
INTRAMUSCULAR | Status: DC | PRN
  Administered 2019-01-21 (×3): 1 mg via INTRAVENOUS

## 2019-01-21 MED ORDER — TICAGRELOR 90 MG PO TABS
ORAL_TABLET | ORAL | Status: AC
  Filled 2019-01-21: qty 2

## 2019-01-21 MED ORDER — IOHEXOL 350 MG/ML SOLN
INTRAVENOUS | Status: DC | PRN
  Administered 2019-01-21: 175 mL

## 2019-01-21 MED ORDER — VERAPAMIL HCL 2.5 MG/ML IV SOLN
INTRAVENOUS | Status: DC | PRN
  Administered 2019-01-21: 10 mL via INTRA_ARTERIAL

## 2019-01-21 MED FILL — ASPIRIN LOW DOSE 81 MG TBEC: 81 | 30 days supply | Qty: 30 | Fill #0

## 2019-01-21 MED FILL — BRILINTA 90 MG TABLET: 90 | 30 days supply | Qty: 60 | Fill #0

## 2019-01-21 MED FILL — ATORVASTATIN CALCIUM 80 MG: 80 | 30 days supply | Qty: 30 | Fill #0

## 2019-01-21 SURGICAL SUPPLY — 22 items
BALLN SAPPHIRE 2.0X12 (BALLOONS) ×2
BALLN SAPPHIRE 2.5X12 (BALLOONS) ×2
BALLN SAPPHIRE ~~LOC~~ 3.0X12 (BALLOONS) ×1 IMPLANT
BALLOON SAPPHIRE 2.0X12 (BALLOONS) IMPLANT
BALLOON SAPPHIRE 2.5X12 (BALLOONS) IMPLANT
CATH 5FR JL3.5 JR4 ANG PIG MP (CATHETERS) ×1 IMPLANT
CATH BALLN WEDGE 5F 110CM (CATHETERS) ×1 IMPLANT
CATH LAUNCHER 6FR AL.75 (CATHETERS) ×1 IMPLANT
CATH VISTA GUIDE 6FR XBLAD3.5 (CATHETERS) ×1 IMPLANT
DEVICE RAD COMP TR BAND LRG (VASCULAR PRODUCTS) ×1 IMPLANT
GLIDESHEATH SLEND SS 6F .021 (SHEATH) ×1 IMPLANT
GUIDEWIRE INQWIRE 1.5J.035X260 (WIRE) IMPLANT
INQWIRE 1.5J .035X260CM (WIRE) ×2
KIT ENCORE 26 ADVANTAGE (KITS) ×1 IMPLANT
KIT HEART LEFT (KITS) ×2 IMPLANT
PACK CARDIAC CATHETERIZATION (CUSTOM PROCEDURE TRAY) ×2 IMPLANT
SHEATH GLIDE SLENDER 4/5FR (SHEATH) ×1 IMPLANT
STENT SYNERGY DES 2.25X16 (Permanent Stent) ×1 IMPLANT
STENT SYNERGY DES 3X20 (Permanent Stent) ×1 IMPLANT
TRANSDUCER W/STOPCOCK (MISCELLANEOUS) ×2 IMPLANT
TUBING CIL FLEX 10 FLL-RA (TUBING) ×2 IMPLANT
WIRE COUGAR XT STRL 190CM (WIRE) ×1 IMPLANT

## 2019-01-21 NOTE — Progress Notes (Signed)
CARDIAC REHAB PHASE I   Stent education completed with pt. Pt educated on importance of ASA and Brilinta. Pt given stent card and heart healthy diet. Reviewed restrictions, site care, and exercise guidelines. Reinforced importance of daily weights and monitoring salt intake. Will refer to CRP II [place]. Pt is interested in participating in Virtual Cardiac and Pulmonary Rehab. Pt advised that Virtual Cardiac and Pulmonary Rehab is provided at no cost to the patient.  Checklist:  1. Pt has smart device  ie smartphone and/or ipad for downloading an app  Yes 2. Reliable internet/wifi service    Yes 3. Understands how to use their smartphone and navigate within an app.  Yes  Pt verbalized understanding and is in agreement.  [id number] Rufina Falco, RN BSN 01/21/2019 11:41 AM

## 2019-01-21 NOTE — Progress Notes (Signed)
Lindsey PA in to see pt. OK to discharge.

## 2019-01-21 NOTE — Interval H&P Note (Signed)
History and Physical Interval Note:  01/21/2019 7:33 AM  Austin [name]  has presented today for cardiac cath with the diagnosis of CAD and cardiomyopathy. The various methods of treatment have been discussed with the patient and family. After consideration of risks, benefits and other options for treatment, the patient has consented to  Procedure(s): RIGHT/LEFT HEART CATH AND CORONARY ANGIOGRAPHY (N/A) as a surgical intervention.  The patient's history has been reviewed, patient examined, no change in status, stable for surgery.  I have reviewed the patient's chart and labs.  Questions were answered to the patient's satisfaction.    Cath Lab Visit (complete for each Cath Lab visit)  Clinical Evaluation Leading to the Procedure:   ACS: No.  Non-ACS:    Anginal Classification: CCS I  Anti-ischemic medical therapy: Maximal Therapy (2 or more classes of medications)  Non-Invasive Test Results: High-risk stress test findings: cardiac mortality >3%/year (Cardiac CTA with positive FFR of several vessels)  Prior CABG: No previous CABG         Austin [name]

## 2019-01-21 NOTE — Discharge Summary (Signed)
Discharge Summary/SAME DAY PCI    [name],  MRN: [id number], DOB/AGE: 03/28/1965 53 y.o.  Admit date: 01/21/2019 Discharge date: 01/21/2019  Primary Care Provider: Caren Macadam Primary Cardiologist: Dr. Johnsie Cancel  Discharge Diagnoses    Active Problems:   Chronic systolic congestive heart failure ([place])   Unstable angina ([place])   Allergies No Known Allergies  Diagnostic Studies/Procedures    Cath: 01/21/19   Prox RCA lesion is 20% stenosed.  Mid RCA lesion is 40% stenosed.  Dist RCA lesion is 80% stenosed.  A drug-eluting stent was successfully placed using a STENT SYNERGY DES 2.25X16.  Post intervention, there is a 0% residual stenosis.  Prox LAD to Mid LAD lesion is 20% stenosed.  Mid LAD lesion is 40% stenosed.  2nd Mrg lesion is 90% stenosed.  A drug-eluting stent was successfully placed using a STENT SYNERGY DES 3X20.  Post intervention, there is a 0% residual stenosis.  Prox Cx lesion is 20% stenosed.   1. Severe stenosis mid Circumflex into the second obtuse marginal branch. Successful PTCA/DES x 1 mid Circumflex into OM2.  2. Mild LAD stenosis 3. Severe stenosis distal RCA into PLA. Successful PTCA/DES x 1 distal RCA into PLA 4. Normal filling pressures  Recommendations: ASA and Brilinta for at least six months. Will start Crestor 10 mg daily. Same day PCI discharge if stable.   Diagnostic Dominance: Right  Intervention    _____________   History of Present Illness     53 y.o. first seen consult 06/07/18 for acute CHF. Admitted 06/05/2018 with increasing shortness of breath. Other medical issues include a history of GI bleeding in 2018, and anxiety. The patient reportedly had been noticing increasing shortness of breath for the last few months. In the emergency room he was noted to be hypoxic with a PO2 of 88. He was placed on BiPAP. Chest x-ray showed bilateral interstitial infiltrates and curly B-lines. CT  angiogram was negative for pulmonary embolism. His BNP was 554, his troponin have been negative x3. Echocardiogram showed significant dilatedLV dysfunction with an ejection fraction of 20% and diffuse hypokinesis with inferior akinesis.  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 Cardiac CT done 11/16 with CAD and FFR CT positive in PLB, OM and mid LAD. Seen back in the office on 11/20  to arrange cath to assess ischemic DCM.  Risks including stroke bleeding MI, contrast reaction and need for emergency surgery discussed willing to proceed  Anxious but willing to proceed.   Hospital Course     Underwent cardiac cath noted above with severe lesion in the mLCx into the 2nd OM with successful PCI/DES x1 to Lcx into OM2. Also with severe stenosis in the dRCA into PLA with DES x1. Plan for DAPT with ASA/Brilinta for at least 6 months. Seen by cardiac rehab while in short stay. No complications post cath. Radial site stable. Statin added at discharge. Instructions/precautions regarding cath site care given prior to discharge.    Austin [name] was seen by Dr. Angelena Form and determined stable for discharge home. Follow up in the office has been arranged. Medications are listed below.   _____________  Discharge Vitals Blood pressure 108/69, pulse 62, temperature (!) 97.2 F (36.2 C), temperature  source Oral, resp. rate 18, height 6' (1.829 m), weight 80.7 kg, SpO2 95 %.  Filed Weights   01/21/19 0637  Weight: 80.7 kg    Labs & Radiologic Studies    CBC No results for input(s): WBC, NEUTROABS, HGB, HCT, MCV, PLT in the last 72 hours. Basic Metabolic Panel No results for input(s): NA, K, CL, CO2, GLUCOSE, BUN, CREATININE, CALCIUM, MG, PHOS in the last 72 hours. Liver  Function Tests No results for input(s): AST, ALT, ALKPHOS, BILITOT, PROT, ALBUMIN in the last 72 hours. No results for input(s): LIPASE, AMYLASE in the last 72 hours. Cardiac Enzymes No results for input(s): CKTOTAL, CKMB, CKMBINDEX, TROPONINI in the last 72 hours. BNP Invalid input(s): POCBNP D-Dimer No results for input(s): DDIMER in the last 72 hours. Hemoglobin A1C No results for input(s): HGBA1C in the last 72 hours. Fasting Lipid Panel No results for input(s): CHOL, HDL, LDLCALC, TRIG, CHOLHDL, LDLDIRECT in the last 72 hours. Thyroid Function Tests No results for input(s): TSH, T4TOTAL, T3FREE, THYROIDAB in the last 72 hours.  Invalid input(s): FREET3 _____________  Ct Coronary Morph W/cta Cor W/score W/ca W/cm &/or Wo/cm  Addendum Date: 01/05/2019   ADDENDUM REPORT: 01/05/2019 12:59 CLINICAL DATA:  Chest pain EXAM: Cardiac CTA MEDICATIONS: Sub lingual nitro. 4 mg and lopressor mg TECHNIQUE: The patient was scanned on a [employer] 192 scanner. Gantry rotation speed was 250 msecs. Collimation was. 6 mm . A 120 kV prospective scan was triggered in the ascending thoracic aorta at 140 HU's with full mA between 30-70% of the R-R interval . Average HR during the scan was bpm. The 3D data set was interpreted on a dedicated work station using MPR, MIP and VRT modes. A total of 80 cc of contrast was used. FINDINGS: Non-cardiac: See separate report from [facility]. No significant findings on limited lung and soft tissue windows. Calcium score: Mild calcium noted in all 3 major epicardial coronary arteries Coronary Arteries: Right dominant with no anomalies LM: Normal LAD: 1-24% calcified plaque in proximal and distal vessel IM: 1-24% calcified plaque proximally D1: Normal D2: Normal D3: Normal Circumflex: 25-49% mixed plaque in mid vessel just distal to OM1 take off OM1: Normal OM2: Normal RCA: 1-24% calcified plaque in proximal and mid vessel PDA: Normal PLA: 25-49% soft plaque in  proximal portion IMPRESSION: 1. Mild aortic root dilatation 3.8 cm 2.  Calcium score 41 which is 78 th percentile for age and sex 55. Moderate appearing CAD involving the mid circumflex and posterior lateral branch of the RCA Study sent for FFR CT Charlton Haws Electronically Signed   By: Charlton Haws M.D.   On: 01/05/2019 12:59   Result Date: 01/05/2019 EXAM: OVER-READ INTERPRETATION  CT CHEST The following report is an over-read performed by radiologist Dr. Irish Lack of [facility] on 01/05/2019. This over-read does not include interpretation of cardiac or coronary anatomy or pathology. The coronary CTA interpretation by the cardiologist is attached. COMPARISON:  CTA of the chest on 06/05/2018 FINDINGS: Vascular: No incidental findings. Mediastinum/Nodes: No enlarged lymph nodes or focal masses identified. Lungs/Pleura: Visualized lungs show no evidence of pulmonary edema, consolidation, pneumothorax, nodule or pleural fluid. Upper Abdomen: No acute abnormality. Musculoskeletal: No chest wall mass or suspicious bone lesions identified. IMPRESSION: No significant incidental findings. Electronically Signed: By: Irish Lack M.D. On: 01/05/2019 12:47   Ct Coronary Fractional Flow Reserve Data Prep  Result Date: 01/05/2019 CLINICAL DATA:  CAD EXAM: FFR CT TECHNIQUE: The best systolic and diastolic  phases of the patients gated cardiac CTA sent to [facility] for hemodynamic analysis FINDINGS: FFR CT is positive Posterior lateral branch of RCA 0.85, mid circumflex/OM1 0.50 and mid to distal LAD 0.81 IMPRESSION: Positive FFR CT suggesting flow limiting CAD see description above patient will be referred for catheterization Charlton Haws Electronically Signed   By: Charlton Haws M.D.   On: 01/05/2019 17:44   Disposition   Pt is being discharged home today in good condition.  Follow-up Plans & Appointments    Follow-up Information    Wendall Stade, MD Follow up on 02/09/2019.     Specialty: Cardiology Why: at 11am for your follow up appt.  Contact information: [address] [place] [address] [phone]          Discharge Instructions    Amb Referral to Cardiac Rehabilitation   Complete by: As directed    Diagnosis: Coronary Stents   After initial evaluation and assessments completed: Virtual Based Care may be provided alone or in conjunction with Phase 2 Cardiac Rehab based on patient barriers.: Yes     Discharge Medications     Medication List    TAKE these medications   acetaminophen 500 MG tablet Commonly known as: TYLENOL Take 1,000 mg 2 (two) times daily as needed by mouth for moderate pain.   aspirin EC 81 MG tablet Take 1 tablet (81 mg total) by mouth daily.   atorvastatin 80 MG tablet Commonly known as: Lipitor Take 1 tablet (80 mg total) by mouth daily.   carvedilol 12.5 MG tablet Commonly known as: COREG Take 1 tablet (12.5 mg total) by mouth 2 (two) times daily with a meal.   clonazePAM 0.5 MG tablet Commonly known as: KLONOPIN Take 0.5-1 tablets (0.25-0.5 mg total) by mouth daily as needed for anxiety.   Entresto 97-103 MG Generic drug: sacubitril-valsartan Take 1 tablet by mouth 2 (two) times daily.   MELATONIN PO Take 1 tablet by mouth at bedtime as needed (sleep).   omeprazole 20 MG tablet Commonly known as: PRILOSEC OTC Take 20 mg by mouth daily.   spironolactone 25 MG tablet Commonly known as: ALDACTONE Take 0.5 tablets (12.5 mg total) by mouth daily.   ticagrelor 90 MG Tabs tablet Commonly known as: BRILINTA Take 1 tablet (90 mg total) by mouth 2 (two) times daily.       No                               Did the patient have a percutaneous coronary intervention (stent / angioplasty)?:  Yes.     Cath/PCI Registry Performance & Quality Measures: 1. Aspirin prescribed? - Yes 2. ADP Receptor Inhibitor (Plavix/Clopidogrel, Brilinta/Ticagrelor or Effient/Prasugrel) prescribed (includes  medically managed patients)? - Yes 3. High Intensity Statin (Lipitor 40-80mg  or Crestor 20-40mg ) prescribed? - Yes 4. For EF <40%, was ACEI/ARB prescribed? - Yes 5. For EF <40%, Aldosterone Antagonist (Spironolactone or Eplerenone) prescribed? - Yes 6. Cardiac Rehab Phase II ordered (Included Medically managed Patients)? - Yes   Outstanding Labs/Studies   FLP/LFTs in 6 weeks.   Duration of Discharge Encounter   Greater than 30 minutes including physician time.  Signed, Laverda Page NP-C 01/21/2019, 2:51 PM

## 2019-01-21 NOTE — Discharge Instructions (Signed)
DRINK PLENTY OF FLUIDS FOR THE NEXT 2-3 DAYS.  KEEP AFFECTED ARM ELEVATED FOR THE REMAINDER OF THE DAY.  Radial Site Care  This sheet gives you information about how to care for yourself after your procedure. Your health care provider may also give you more specific instructions. If you have problems or questions, contact your health care provider. What can I expect after the procedure? After the procedure, it is common to have:  Bruising and tenderness at the catheter insertion area. Follow these instructions at home: Medicines  Take over-the-counter and prescription medicines only as told by your health care provider. Insertion site care  Follow instructions from your health care provider about how to take care of your insertion site. Make sure you: ? Wash your hands with soap and water before you change your bandage (dressing). If soap and water are not available, use hand sanitizer. ? Change your dressing as told by your health care provider.  Check your insertion site every day for signs of infection. Check for: ? Redness, swelling, or pain. ? Fluid or blood. ? Pus or a bad smell. ? Warmth.  Do not take baths, swim, or use a hot tub for 5 days.  You may shower 24-48 hours after the procedure. ? Remove the dressing and gently wash the site with plain soap and water. ? Pat the area dry with a clean towel. ? Do not rub the site. That could cause bleeding.  Do not apply powder or lotion to the site. Activity   For 24 hours after the procedure, or as directed by your health care provider: ? Do not flex or bend the affected arm. ? Do not push or pull heavy objects with the affected arm. ? Do not drive yourself home from the hospital or clinic. You may drive 24 hours after the procedure unless your health care provider tells you not to. ? Do not operate machinery or power tools.  Do not lift anything that is heavier than 10 lb for 5 days.  Ask your health care provider  when it is okay to: ? Return to work or school. ? Resume usual physical activities or sports. ? Resume sexual activity. General instructions  If the catheter site starts to bleed, raise your arm and put firm pressure on the site. If the bleeding does not stop, get help right away. This is a medical emergency.  If you went home on the same day as your procedure, a responsible adult should be with you for the first 24 hours after you arrive home.  Keep all follow-up visits as told by your health care provider. This is important. Contact a health care provider if:  You have a fever.  You have redness, swelling, or yellow drainage around your insertion site. Get help right away if:  You have unusual pain at the radial site.  The catheter insertion area swells very fast.  The insertion area is bleeding, and the bleeding does not stop when you hold steady pressure on the area.  Your arm or hand becomes pale, cool, tingly, or numb. These symptoms may represent a serious problem that is an emergency. Do not wait to see if the symptoms will go away. Get medical help right away. Call your local emergency services (911 in the [place] Do not drive yourself to the hospital. Summary  After the procedure, it is common to have bruising and tenderness at the site.  Follow instructions from your health care provider about how to  take care of your radial site wound. Check the wound every day for signs of infection.  Do not lift anything that is heavier than 10 lb for 5 days.  This information is not intended to replace advice given to you by your health care provider. Make sure you discuss any questions you have with your health care provider. Document Released: 03/10/2010 Document Revised: 03/13/2017 Document Reviewed: 03/13/2017 Elsevier Patient Education  2020 [place] about your medication: Brilinta (anti-platelet agent)  Generic Name (Brand): ticagrelor (Brilinta),  twice daily medication  PURPOSE: You are taking this medication along with aspirin to lower your chance of having a heart attack, stroke, or blood clots in your heart stent. These can be fatal. Brilinta and aspirin help prevent platelets from sticking together and forming a clot that can block an artery or your stent.   Common SIDE EFFECTS you may experience include: bruising or bleeding more easily, shortness of breath  Do not stop taking BRILINTA without talking to the doctor who prescribes it for you. People who are treated with a stent and stop taking Brilinta too soon, have a higher risk of getting a blood clot in the stent, having a heart attack, or dying. If you stop Brilinta because of bleeding, or for other reasons, your risk of a heart attack or stroke may increase.   Tell all of your doctors and dentists that you are taking Brilinta. They should talk to the doctor who prescribed Brilinta for you before you have any surgery or invasive procedure.   Contact your health care provider if you experience: severe or uncontrollable bleeding, pink/red/brown urine, vomiting blood or vomit that looks like "coffee grounds", red or black stools (looks like tar), coughing up blood or blood clots ----------------------------------------------------------------------------------------------------------------------

## 2019-01-22 ENCOUNTER — Encounter: Payer: Self-pay | Admitting: Family Medicine

## 2019-01-22 ENCOUNTER — Encounter (HOSPITAL_COMMUNITY): Payer: Self-pay | Admitting: Cardiovascular Disease

## 2019-01-23 ENCOUNTER — Other Ambulatory Visit: Payer: Self-pay | Admitting: Family Medicine

## 2019-01-23 MED ORDER — CLONAZEPAM 0.5 MG PO TABS: 0.2500 mg | ORAL_TABLET | Freq: Two times a day (BID) | ORAL | 0 refills | Status: DC | PRN

## 2019-01-29 ENCOUNTER — Ambulatory Visit: Payer: Managed Care, Other (non HMO) | Admitting: Cardiovascular Disease

## 2019-01-30 ENCOUNTER — Other Ambulatory Visit: Payer: Self-pay

## 2019-01-30 ENCOUNTER — Encounter: Payer: Self-pay | Admitting: Internal Medicine

## 2019-01-30 ENCOUNTER — Ambulatory Visit: Payer: Managed Care, Other (non HMO) | Admitting: Internal Medicine

## 2019-01-30 VITALS — BP 142/84 | HR 82 | Ht 72.0 in | Wt 184.2 lb

## 2019-01-30 DIAGNOSIS — R931 Abnormal findings on diagnostic imaging of heart and coronary circulation: Secondary | ICD-10-CM

## 2019-01-30 DIAGNOSIS — I5022 Chronic systolic (congestive) heart failure: Secondary | ICD-10-CM | POA: Diagnosis not present

## 2019-01-30 DIAGNOSIS — I38 Endocarditis, valve unspecified: Secondary | ICD-10-CM | POA: Diagnosis not present

## 2019-01-30 DIAGNOSIS — I509 Heart failure, unspecified: Secondary | ICD-10-CM | POA: Diagnosis not present

## 2019-01-30 NOTE — Patient Instructions (Signed)
Medication Instructions:  Your physician recommends that you continue on your current medications as directed. Please refer to the Current Medication list given to you today.   *If you need a refill on your cardiac medications before your next appointment, please call your pharmacy*  Lab Work: None ordered.  If you have labs (blood work) drawn today and your tests are completely normal, you will receive your results only by: . MyChart Message (if you have MyChart) OR . A paper copy in the mail If you have any lab test that is abnormal or we need to change your treatment, we will call you to review the results.  Testing/Procedures: None ordered.    Follow-Up: At [facility], you and your health needs are our priority.  As part of our continuing mission to provide you with exceptional heart care, we have created designated Provider Care Teams.  These Care Teams include your primary Cardiologist (physician) and Advanced Practice Providers (APPs -  Physician Assistants and Nurse Practitioners) who all work together to provide you with the care you need, when you need it.  Your next appointment:  Follow up with Dr Klein as needed.   

## 2019-01-30 NOTE — Progress Notes (Signed)
ELECTROPHYSIOLOGY CONSULT NOTE  [name], MRN: [id number], DOB/AGE: [birth year] 51 y.o. Admit date: (Not on file) Date of Consult: 01/30/2019  Primary Physician: Wynn Banker, MD Primary Cardiologist: Genene Churn     Austin [name] is a 53 y.o. male who is being seen today for the evaluation of ICD at the request of Pni.    HPI Austin [name] is a 53 y.o. male presented with symptoms of congestive heart failure manifested largely by orthopnea in April.  Evaluation ended up demonstrating left ventricular dysfunction.  Medical therapy was initiated.  Symptoms largely abated.  He stopped smoking at this time also.  Further evaluation earlier this fall demonstrated a full-thickness scar by MRI scanning CTA was concerning for occlusive coronary disease and he underwent catheterization and subsequent stenting 12/20.  He has had some breathlessness which is gradually resolving following his stenting and the use of Brilinta.  The patient denies chest pain,  nocturnal dyspnea, orthopnea or peripheral edema.  There have been no palpitations, lightheadedness or syncope.    FHx + CAD.  DATE TEST EF   4/20 Echo   20 % INF AK   10/20 cMRI   Full thickness Infer Scar  11/20 CTA  24 %   12/20 LHC  LADp20; OM2 90>>stent ;RCAd 80>Stent       Past Medical History:  Diagnosis Date  . Anxiety   . Gastric ulcer   . GERD (gastroesophageal reflux disease)   . Guillain Barr syndrome (HCC) 1984   COMPLETE RECOVERY; triggered from flu illness  . Headache(784.0)    hx of tension-none recent  . Hematemesis 09/07/2016  . History of kidney stones 09/19/12   left ureteral stone-SURGERY 8/6 URETEROSCOPY / STENT PLACEMENT   . Partial gastric outlet obstruction   . UGIB (upper gastrointestinal bleed) 09/07/2016      Surgical History:  Past Surgical History:  Procedure Laterality Date  . BALLOON DILATION N/A 11/15/2016   Procedure: BALLOON DILATION;  Surgeon:  Benancio Deeds, MD;  Location: [facility];  Service: Gastroenterology;  Laterality: N/A;  . CORONARY STENT INTERVENTION N/A 01/21/2019   Procedure: CORONARY STENT INTERVENTION;  Surgeon: Kathleene Hazel, MD;  Location: [facility];  Service: Cardiovascular;  Laterality: N/A;  . CYSTO, LEFT RGP, DIAGNOSTIC URETEROSCOPY AND STENT PLACEMENT  09/24/2012  . CYSTOSCOPY WITH RETROGRADE PYELOGRAM, URETEROSCOPY AND STENT PLACEMENT Left 09/24/2012   Procedure: CYSTOSCOPY WITH RETROGRADE PYELOGRAM, DIAGNOSTIC URETEROSCOPY AND STENT PLACEMENT;  Surgeon: Sebastian Ache, MD;  Location: [facility];  Service: Urology;  Laterality: Left;  . CYSTOSCOPY WITH RETROGRADE PYELOGRAM, URETEROSCOPY AND STENT PLACEMENT Left 10/08/2012   Procedure: CYSTOSCOPY WITH RETROGRADE PYELOGRAM, URETEROSCOPY AND STENT PLACEMENT;  Surgeon: Sebastian Ache, MD;  Location: [facility];  Service: Urology;  Laterality: Left;  1 HR NEEDS DIGITAL URETEROSCOPE   . ESOPHAGOGASTRODUODENOSCOPY N/A 09/08/2016   Procedure: ESOPHAGOGASTRODUODENOSCOPY (EGD);  Surgeon: Ruffin Frederick, MD;  Location: [facility];  Service: Gastroenterology;  Laterality: N/A;  . ESOPHAGOGASTRODUODENOSCOPY N/A 11/15/2016   Procedure: ESOPHAGOGASTRODUODENOSCOPY (EGD);  Surgeon: Benancio Deeds, MD;  Location: [facility];  Service: Gastroenterology;  Laterality: N/A;  . ESOPHAGOGASTRODUODENOSCOPY N/A 12/31/2016   Procedure: ESOPHAGOGASTRODUODENOSCOPY (EGD);  Surgeon: Benancio Deeds, MD;  Location: [facility];  Service: Gastroenterology;  Laterality: N/A;  . ESOPHAGOGASTRODUODENOSCOPY (EGD) WITH PROPOFOL N/A 10/15/2016   Procedure: ESOPHAGOGASTRODUODENOSCOPY (EGD) WITH PROPOFOL; Duodenal dilation;  Surgeon: Ruffin Frederick, MD;  Location: [facility];  Service: Gastroenterology;  Laterality: N/A;  .  HOLMIUM LASER APPLICATION Left 10/08/2012   Procedure: HOLMIUM LASER APPLICATION;  Surgeon: Sebastian Acheheodore Manny, MD;  Location: [facility];   Service: Urology;  Laterality: Left;  . RIGHT/LEFT HEART CATH AND CORONARY ANGIOGRAPHY N/A 01/21/2019   Procedure: RIGHT/LEFT HEART CATH AND CORONARY ANGIOGRAPHY;  Surgeon: Kathleene HazelMcAlhany, Christopher D, MD;  Location: [facility];  Service: Cardiovascular;  Laterality: N/A;  . SAVORY DILATION N/A 12/31/2016   Procedure: POSSIBLE SAVORY DILATION;  Surgeon: Benancio DeedsArmbruster, Kathryn Linarez P, MD;  Location: [facility];  Service: Gastroenterology;  Laterality: N/A;  . TONSILLECTOMY     t&a as a child  . wisdom teeth extracted       Home Meds: Current Meds  Medication Sig  . acetaminophen (TYLENOL) 500 MG tablet Take 1,000 mg 2 (two) times daily as needed by mouth for moderate pain.  Marland Kitchen. aspirin EC 81 MG tablet Take 1 tablet (81 mg total) by mouth daily.  Marland Kitchen. atorvastatin (LIPITOR) 80 MG tablet Take 1 tablet (80 mg total) by mouth daily.  . carvedilol (COREG) 12.5 MG tablet Take 1 tablet (12.5 mg total) by mouth 2 (two) times daily with a meal.  . clonazePAM (KLONOPIN) 0.5 MG tablet Take 0.5-1 tablets (0.25-0.5 mg total) by mouth 2 (two) times daily as needed for anxiety.  Marland Kitchen. MELATONIN PO Take 1 tablet by mouth at bedtime as needed (sleep).  Marland Kitchen. omeprazole (PRILOSEC OTC) 20 MG tablet Take 20 mg by mouth daily.  . sacubitril-valsartan (ENTRESTO) 97-103 MG Take 1 tablet by mouth 2 (two) times daily.  Marland Kitchen. spironolactone (ALDACTONE) 25 MG tablet Take 0.5 tablets (12.5 mg total) by mouth daily.  . ticagrelor (BRILINTA) 90 MG TABS tablet Take 1 tablet (90 mg total) by mouth 2 (two) times daily.    Allergies: No Known Allergies  Social History   Socioeconomic History  . Marital status: Married    Spouse name: Not on file  . Number of children: Not on file  . Years of education: Not on file  . Highest education level: Not on file  Occupational History  . Not on file  Tobacco Use  . Smoking status: Former Smoker    Packs/day: 1.50    Years: 28.00    Pack years: 42.00    Types: Cigarettes    Quit date:  05/02/2018    Years since quitting: 0.7  . Smokeless tobacco: Never Used  Substance and Sexual Activity  . Alcohol use: Yes    Comment: occasional  . Drug use: No    Types: Marijuana    Comment: in the past  . Sexual activity: Not on file  Other Topics Concern  . Not on file  Social History Narrative  . Not on file   Social Determinants of Health   Financial Resource Strain:   . Difficulty of Paying Living Expenses: Not on file  Food Insecurity:   . Worried About Programme researcher, broadcasting/film/videounning Out of Food in the Last Year: Not on file  . Ran Out of Food in the Last Year: Not on file  Transportation Needs:   . Lack of Transportation (Medical): Not on file  . Lack of Transportation (Non-Medical): Not on file  Physical Activity:   . Days of Exercise per Week: Not on file  . Minutes of Exercise per Session: Not on file  Stress:   . Feeling of Stress : Not on file  Social Connections:   . Frequency of Communication with Friends and Family: Not on file  . Frequency of Social Gatherings with Friends  and Family: Not on file  . Attends Religious Services: Not on file  . Active Member of Clubs or Organizations: Not on file  . Attends Banker Meetings: Not on file  . Marital Status: Not on file  Intimate Partner Violence:   . Fear of Current or Ex-Partner: Not on file  . Emotionally Abused: Not on file  . Physically Abused: Not on file  . Sexually Abused: Not on file     Family History  Problem Relation Age of Onset  . Heart disease Mother   . Congestive Heart Failure Mother   . Ulcerative colitis Father   . Hemachromatosis Father   . Rheum arthritis Father   . Heart disease Brother 48       stenting     ROS:  Please see the history of present illness.     All other systems reviewed and negative.    Physical Exam: Blood pressure (!) 142/84, pulse 82, height 6' (1.829 m), weight 184 lb 3.2 oz (83.6 kg), SpO2 98 %. General: Well developed, well nourished male in no acute  distress. Head: Normocephalic, atraumatic, sclera non-icteric, no xanthomas, nares are without discharge. EENT: normal  Lymph Nodes:  none Neck: Negative for carotid bruits. JVD not elevated. Back:without scoliosis kyphosis Lungs: Clear bilaterally to auscultation without wheezes, rales, or rhonchi. Breathing is unlabored. Heart: RRR with S1 S2. No  murmur . No rubs, or gallops appreciated. Abdomen: Soft, non-tender, non-distended with normoactive bowel sounds. No hepatomegaly. No rebound/guarding. No obvious abdominal masses. Msk:  Strength and tone appear normal for age. Extremities: No clubbing or cyanosis. No edema.  Distal pedal pulses are 2+ and equal bilaterally. Skin: Warm and Dry Neuro: Alert and oriented X 3. CN III-XII intact Grossly normal sensory and motor function . Psych:  Responds to questions appropriately with a normal affect.      Labs: Cardiac Enzymes No results for input(s): CKTOTAL, CKMB, TROPONINI in the last 72 hours. CBC Lab Results  Component Value Date   WBC 7.6 01/09/2019   HGB 15.0 01/21/2019   HCT 44.0 01/21/2019   MCV 95 01/09/2019   PLT 258 01/09/2019   PROTIME: No results for input(s): LABPROT, INR in the last 72 hours. Chemistry No results for input(s): NA, K, CL, CO2, BUN, CREATININE, CALCIUM, PROT, BILITOT, ALKPHOS, ALT, AST, GLUCOSE in the last 168 hours.  Invalid input(s): LABALBU Lipids Lab Results  Component Value Date   CHOL 203 (H) 06/02/2018   HDL 36.40 (L) 06/02/2018   LDLCALC 145 (H) 06/02/2018   TRIG 110.0 06/02/2018   BNP Pro B Natriuretic peptide (BNP)  Date/Time Value Ref Range Status  06/05/2018 11:59 AM 544.0 (H) 0.0 - 100.0 pg/mL Final   NT-Pro BNP  Date/Time Value Ref Range Status  10/31/2018 01:08 PM 1,054 (H) 0 - 121 pg/mL Final    Comment:    The following cut-points have been suggested for the use of proBNP for the diagnostic evaluation of heart failure (HF) in patients with acute dyspnea: Modality                      Age           Optimal Cut                            (years)            Point ------------------------------------------------------ Diagnosis (rule in HF)        <  50            450 pg/mL                           50 - 75            900 pg/mL                               >75           1800 pg/mL Exclusion (rule out HF)  Age independent     300 pg/mL    Thyroid Function Tests: No results for input(s): TSH, T4TOTAL, T3FREE, THYROIDAB in the last 72 hours.  Invalid input(s): FREET3 Miscellaneous Lab Results  Component Value Date   DDIMER 2.05 (H) 06/05/2018    Radiology/Studies:  CARDIAC CATHETERIZATION  Result Date: 01/21/2019  Prox RCA lesion is 20% stenosed.  Mid RCA lesion is 40% stenosed.  Dist RCA lesion is 80% stenosed.  A drug-eluting stent was successfully placed using a STENT SYNERGY DES 2.25X16.  Post intervention, there is a 0% residual stenosis.  Prox LAD to Mid LAD lesion is 20% stenosed.  Mid LAD lesion is 40% stenosed.  2nd Mrg lesion is 90% stenosed.  A drug-eluting stent was successfully placed using a STENT SYNERGY DES 3X20.  Post intervention, there is a 0% residual stenosis.  Prox Cx lesion is 20% stenosed.  1. Severe stenosis mid Circumflex into the second obtuse marginal branch. Successful PTCA/DES x 1 mid Circumflex into OM2. 2. Mild LAD stenosis 3. Severe stenosis distal RCA into PLA. Successful PTCA/DES x 1 distal RCA into PLA 4. Normal filling pressures Recommendations: ASA and Brilinta for at least six months. Will start Crestor 10 mg daily. Same day PCI discharge if stable.   CT CORONARY MORPH W/CTA COR W/SCORE W/CA W/CM &/OR WO/CM  Addendum Date: 01/05/2019   ADDENDUM REPORT: 01/05/2019 12:59 CLINICAL DATA:  Chest pain EXAM: Cardiac CTA MEDICATIONS: Sub lingual nitro. 4 mg and lopressor mg TECHNIQUE: The patient was scanned on a [employer] 192 scanner. Gantry rotation speed was 250 msecs. Collimation was. 6 mm . A 120 kV prospective scan  was triggered in the ascending thoracic aorta at 140 HU's with full mA between 30-70% of the R-R interval . Average HR during the scan was bpm. The 3D data set was interpreted on a dedicated work station using MPR, MIP and VRT modes. A total of 80 cc of contrast was used. FINDINGS: Non-cardiac: See separate report from [facility]. No significant findings on limited lung and soft tissue windows. Calcium score: Mild calcium noted in all 3 major epicardial coronary arteries Coronary Arteries: Right dominant with no anomalies LM: Normal LAD: 1-24% calcified plaque in proximal and distal vessel IM: 1-24% calcified plaque proximally D1: Normal D2: Normal D3: Normal Circumflex: 25-49% mixed plaque in mid vessel just distal to OM1 take off OM1: Normal OM2: Normal RCA: 1-24% calcified plaque in proximal and mid vessel PDA: Normal PLA: 25-49% soft plaque in proximal portion IMPRESSION: 1. Mild aortic root dilatation 3.8 cm 2.  Calcium score 41 which is 58 th percentile for age and sex 26. Moderate appearing CAD involving the mid circumflex and posterior lateral branch of the RCA Study sent for FFR CT Jenkins Rouge Electronically Signed   By: Jenkins Rouge M.D.   On: 01/05/2019 12:59   Result Date: 01/05/2019 EXAM: OVER-READ INTERPRETATION  CT CHEST The following report is an over-read performed by radiologist Dr. Irish LackGlenn Yamagata [facility] on 01/05/2019. This over-read does not include interpretation of cardiac or coronary anatomy or pathology. The coronary CTA interpretation by the cardiologist is attached. COMPARISON:  CTA of the chest on 06/05/2018 FINDINGS: Vascular: No incidental findings. Mediastinum/Nodes: No enlarged lymph nodes or focal masses identified. Lungs/Pleura: Visualized lungs show no evidence of pulmonary edema, consolidation, pneumothorax, nodule or pleural fluid. Upper Abdomen: No acute abnormality. Musculoskeletal: No chest wall mass or suspicious bone lesions identified.  IMPRESSION: No significant incidental findings. Electronically Signed: By: Irish LackGlenn  Yamagata M.D. On: 01/05/2019 12:47   CT CORONARY FRACTIONAL FLOW RESERVE DATA PREP  Result Date: 01/05/2019 CLINICAL DATA:  CAD EXAM: FFR CT TECHNIQUE: The best systolic and diastolic phases of the patients gated cardiac CTA sent to Heart[facility] for hemodynamic analysis FINDINGS: FFR CT is positive Posterior lateral branch of RCA 0.85, mid circumflex/OM1 0.50 and mid to distal LAD 0.81 IMPRESSION: Positive FFR CT suggesting flow limiting CAD see description above patient will be referred for catheterization Charlton HawsPeter Nishan Electronically Signed   By: Charlton HawsPeter  Nishan M.D.   On: 01/05/2019 17:44    EKG: Sinus @ 60 19/11/43 ILBBB   Assessment and Plan:  Ischemic and Nonischemic Cardiomyopathy  CAD s/p revascularization RCA and OM  Congestive heart failure-chronic-systolic class II  Patient had persistent left ventricular dysfunction for months following his initial presentation.  However, following revascularization there are reasons to be optimistic that there will be improvement in LV systolic function that might obviate the need for ICD implantation for primary prevention.  Hence, guidelines mandate that we wait 90 days following revascularization prior to making a determination of ICD implantation.  Ejection fraction and functional status then are used to determine eligibility.  We will asked Dr. Genene ChurnPNI to reevaluate left ventricular function around the beginning of March.  Have encouraged him to continue on his excellent medical therapy.  His blood pressure was elevated today but this seems to be anomalous.  Euvolemic continue current meds        Sherryl MangesSteven Elnor Renovato

## 2019-02-01 NOTE — Progress Notes (Signed)
Needs cardiac MRI mid march for EF and f/u with DR Caryl Comes after MRI to assess for AICD

## 2019-02-02 ENCOUNTER — Other Ambulatory Visit: Payer: Self-pay

## 2019-02-02 ENCOUNTER — Ambulatory Visit (HOSPITAL_COMMUNITY)
Admission: RE | Admit: 2019-02-02 | Discharge: 2019-02-02 | Disposition: A | Discharge status: Home / Self Care | Payer: Managed Care, Other (non HMO) | Source: Ambulatory Visit | Attending: Cardiology | Admitting: Cardiology

## 2019-02-02 ENCOUNTER — Encounter (HOSPITAL_COMMUNITY): Payer: Self-pay | Admitting: Cardiology

## 2019-02-02 VITALS — BP 120/82 | HR 78 | Wt 182.2 lb

## 2019-02-02 DIAGNOSIS — I5022 Chronic systolic (congestive) heart failure: Secondary | ICD-10-CM

## 2019-02-02 DIAGNOSIS — I2510 Atherosclerotic heart disease of native coronary artery without angina pectoris: Secondary | ICD-10-CM | POA: Insufficient documentation

## 2019-02-02 DIAGNOSIS — K219 Gastro-esophageal reflux disease without esophagitis: Secondary | ICD-10-CM | POA: Diagnosis not present

## 2019-02-02 DIAGNOSIS — Z87891 Personal history of nicotine dependence: Secondary | ICD-10-CM | POA: Insufficient documentation

## 2019-02-02 DIAGNOSIS — Z8711 Personal history of peptic ulcer disease: Secondary | ICD-10-CM | POA: Diagnosis not present

## 2019-02-02 DIAGNOSIS — Z79899 Other long term (current) drug therapy: Secondary | ICD-10-CM | POA: Diagnosis not present

## 2019-02-02 DIAGNOSIS — Z955 Presence of coronary angioplasty implant and graft: Secondary | ICD-10-CM | POA: Diagnosis not present

## 2019-02-02 DIAGNOSIS — Z8249 Family history of ischemic heart disease and other diseases of the circulatory system: Secondary | ICD-10-CM | POA: Insufficient documentation

## 2019-02-02 DIAGNOSIS — Z7982 Long term (current) use of aspirin: Secondary | ICD-10-CM | POA: Diagnosis not present

## 2019-02-02 LAB — BASIC METABOLIC PANEL
Anion gap: 11 (ref 5–15)
BUN: 16 mg/dL (ref 6–20)
CO2: 23 mmol/L (ref 22–32)
Calcium: 9 mg/dL (ref 8.9–10.3)
Chloride: 107 mmol/L (ref 98–111)
Creatinine, Ser: 0.56 mg/dL — ABNORMAL LOW (ref 0.61–1.24)
GFR calc Af Amer: >60 mL/min (ref >60)
GFR calc non Af Amer: >60 mL/min (ref >60)
Glucose, Bld: 97 mg/dL (ref 70–99)
Potassium: 3.6 mmol/L (ref 3.5–5.1)
Sodium: 141 mmol/L (ref 135–145)

## 2019-02-02 MED ORDER — SPIRONOLACTONE 25 MG PO TABS: 25.0000 mg | ORAL_TABLET | Freq: Every day | ORAL | 3 refills | Status: DC

## 2019-02-02 MED ORDER — CARVEDILOL 12.5 MG PO TABS: 18.7500 mg | ORAL_TABLET | Freq: Two times a day (BID) | ORAL | 3 refills | Status: DC

## 2019-02-02 NOTE — Progress Notes (Signed)
PCP: Caren Macadam, MD Cardiology: Dr. Johnsie Cancel HF Cardiology: Dr. Aundra Dubin  53 y.o. with history of chronic systolic CHF and CAD was referred by Dr. Johnsie Cancel for evaluation of CHF.  Patient was in his usual state of health until around 4/20.  He was admitted at that time with acute CHF, had been short of breath and orthopneic at home.  No chest pain.  At admission, he was found to be volume overloaded with echo showing EF 20%. He was started on cardiac meds and discharged.  Cardiac MRI in 10/20 showed EF 24%, moderately decreased RV systolic function, and full thickness LGE in the basal-mid inferior wall concerning for prior MI. He then had right and left heart cath in 12/20. This showed normal filling pressures and cardiac output.  He had DES to mid LCx/OM2 and distal RCA.   He is currently doing well. He is walking 2 miles/day with no exertional dyspnea or chest pain.  Breathing feels better post-PCI. No orthopnea/PND.  He does not think that he can do cardiac rehab because of his work schedule.   ECG (personally reviewed): NSR, normal  Labs (11/20): K 4.4, creatinine 0.68, hgb 13.5  PMH: 1. H/o Guillain Barre syndrome with recovery in 1984.  2. GERD 3. Nephrolithiasis 4. PUD 5. 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  Echo (4/20) with EF 20%, moderate to severe LV dilation, moderately decreased RV systolic function, mild-moderate MR.  - Cardiac MRI (10/20): EF 24%, severe LV dilation, diffuse hypokinesis, full thickness scar basal-mid inferolateral wall suggestive of prior MI.  - LHC/RHC (12/20): 90% mLCx/OM2 stenosis treated with DES, 80% distal RCA stenosis treated with DES.  Mean RA 1, mean PA 14, mean PCWP 9, CI 2.63.  6. Prior smoker.  7. CAD: LHC (12/20) with 90% mLCx/OM2 stenosis treated with DES, 80% distal RCA stenosis treated with DES.   FH: Mother with CHF, father with PCI, brother with PCI.   SH: Works in Press photographer for [employer]. Former smoker, quit  3/20.  No ETOH. Married, lives in [place].  Plays in a classic rock band.   ROS: All systems reviewed and negative except as per HPI.   Current Outpatient Medications  Medication Sig Dispense Refill  . acetaminophen (TYLENOL) 500 MG tablet Take 1,000 mg 2 (two) times daily as needed by mouth for moderate pain.    Marland Kitchen aspirin EC 81 MG tablet Take 1 tablet (81 mg total) by mouth daily. 30 tablet 11  . atorvastatin (LIPITOR) 80 MG tablet Take 1 tablet (80 mg total) by mouth daily. 30 tablet 11  . carvedilol (COREG) 12.5 MG tablet Take 1.5 tablets (18.75 mg total) by mouth 2 (two) times daily with a meal. 270 tablet 3  . clonazePAM (KLONOPIN) 0.5 MG tablet Take 0.5-1 tablets (0.25-0.5 mg total) by mouth 2 (two) times daily as needed for anxiety. 60 tablet 0  . MELATONIN PO Take 1 tablet by mouth at bedtime as needed (sleep).    Marland Kitchen omeprazole (PRILOSEC OTC) 20 MG tablet Take 20 mg by mouth daily.    . sacubitril-valsartan (ENTRESTO) 97-103 MG Take 1 tablet by mouth 2 (two) times daily. 60 tablet 11  . spironolactone (ALDACTONE) 25 MG tablet Take 1 tablet (25 mg total) by mouth daily. 90 tablet 3  . ticagrelor (BRILINTA) 90 MG TABS tablet Take 1 tablet (90 mg total) by mouth 2 (two) times daily. 60 tablet 6   No current facility-administered medications for this encounter.   BP 120/82  Pulse 78   Wt 82.6 kg (182 lb 3.2 oz)   SpO2 96%   BMI 24.71 kg/m  General: NAD Neck: No JVD, no thyromegaly or thyroid nodule.  Lungs: Clear to auscultation bilaterally with normal respiratory effort. CV: Nondisplaced PMI.  Heart regular S1/S2, no S3/S4, no murmur.  No peripheral edema.  No carotid bruit.  Normal pedal pulses.  Abdomen: Soft, nontender, no hepatosplenomegaly, no distention.  Skin: Intact without lesions or rashes.  Neurologic: Alert and oriented x 3.  Psych: Normal affect. Extremities: No clubbing or cyanosis.  HEENT: Normal.   Assessment/Plan: 1. Chronic systolic CHF: Suspect mixed  ischemic/nonischemic cardiomyopathy.  I do not think that his CAD can explain the extent of the cardiomyopathy. Possible co-existing viral myocarditis though signs of this were not seen on MRI (cannot rule out).  No heavy ETOH or drug use.  Mother with CHF at an advanced age.  NYHA class II symptoms.  Not volume overloaded on exam.  - Increase Coreg to 18.75 mg bid.  - Continue Entresto 97/103 bid.  - Increase spironolactone to 25 mg daily.  BMET today and again in 10 days.  - Followup in 3 wks with HF pharmacist to titrate up Coreg to 25 bid, next step will be dapagliflozin.  - Repeat echo in 3 months (post-PCI) to decide on need for ICD.  Narrow QRS so not CRT candidate.  2. CAD: 12/20 PCI to mid LCx/OM2 and distal RCA.  As above, I do not think CAD alone can account for his cardiomyopathy (though suspect it plays a role).   - Continue ASA 81 daily.  - Continue statin, check lipids/LFTs in 2 months.   Followup in 3 wks with HF pharmacist and 6 wks with me.   Marca Ancona 02/02/2019

## 2019-02-02 NOTE — Patient Instructions (Signed)
INCREASE Coreg to 18.75mg  (1.5 tabs) twice a day  INCREASE Spironolactone to 25mg  (1 tab)   Labs today and repeat at next office visit We will only contact you if something comes back abnormal or we need to make some changes. Otherwise no news is good news!   Your physician recommends that you schedule a follow-up appointment in: 3 weeks with pharmacist and 6 weeks with  Dr Aundra Dubin  At the [facility], you and your health needs are our priority. As part of our continuing mission to provide you with exceptional heart care, we have created designated Provider Care Teams. These Care Teams include your primary Cardiologist (physician) and Advanced Practice Providers (APPs- Physician Assistants and Nurse Practitioners) who all work together to provide you with the care you need, when you need it.   You may see any of the following providers on your designated Care Team at your next follow up: Marland Kitchen Dr Glori Bickers . Dr Loralie Champagne . Darrick Grinder, NP . Lyda Jester, PA . Audry Riles, PharmD   Please be sure to bring in all your medications bottles to every appointment.

## 2019-02-03 ENCOUNTER — Telehealth: Payer: Self-pay

## 2019-02-03 DIAGNOSIS — R931 Abnormal findings on diagnostic imaging of heart and coronary circulation: Secondary | ICD-10-CM

## 2019-02-03 NOTE — Telephone Encounter (Addendum)
Per Dr. Johnsie Cancel, patient needs cardiac MRI mid march for EF and f/u with DR Caryl Comes after MRI to assess for AICD. Will send message to Dr. Caryl Comes scheduler to see about setting up appointment for after the MRI. Patient is aware and agreed to plan.

## 2019-02-09 ENCOUNTER — Ambulatory Visit: Payer: Managed Care, Other (non HMO) | Admitting: Cardiology

## 2019-02-24 ENCOUNTER — Inpatient Hospital Stay (HOSPITAL_COMMUNITY): Admission: RE | Admit: 2019-02-24 | Payer: Managed Care, Other (non HMO) | Source: Ambulatory Visit

## 2019-02-24 ENCOUNTER — Other Ambulatory Visit: Payer: Self-pay

## 2019-02-24 ENCOUNTER — Ambulatory Visit (HOSPITAL_COMMUNITY)
Admission: RE | Admit: 2019-02-24 | Discharge: 2019-02-24 | Disposition: A | Discharge status: Home / Self Care | Payer: Managed Care, Other (non HMO) | Source: Ambulatory Visit | Attending: Cardiology | Admitting: Cardiology

## 2019-02-24 DIAGNOSIS — I5022 Chronic systolic (congestive) heart failure: Secondary | ICD-10-CM | POA: Diagnosis present

## 2019-02-24 LAB — BASIC METABOLIC PANEL
Anion gap: 10 (ref 5–15)
BUN: 20 mg/dL (ref 6–20)
CO2: 28 mmol/L (ref 22–32)
Calcium: 9.2 mg/dL (ref 8.9–10.3)
Chloride: 104 mmol/L (ref 98–111)
Creatinine, Ser: 0.77 mg/dL (ref 0.61–1.24)
GFR calc Af Amer: >60 mL/min (ref >60)
GFR calc non Af Amer: >60 mL/min (ref >60)
Glucose, Bld: 77 mg/dL (ref 70–99)
Potassium: 3.9 mmol/L (ref 3.5–5.1)
Sodium: 142 mmol/L (ref 135–145)

## 2019-02-26 ENCOUNTER — Telehealth (HOSPITAL_COMMUNITY): Payer: Self-pay | Admitting: Pharmacist

## 2019-02-26 NOTE — Telephone Encounter (Signed)
Cardiac Rehab Medication Review by a Pharmacist  Does the patient  feel that his/her medications are working for him/her?  Patient states that it is hard to answer this questions because his EF% is still low. I explained how important entresto, spironolactone, and coreg are to maintaining regular heart function and the benefits that cardiac rehab will provide with strengthening heart function.  Has the patient been experiencing any side effects to the medications prescribed?  Yes, patient is experiencing shortness of breath due to the Brilinta. This side effect has been persistent for at least 3 weeks  Does the patient measure his/her own blood pressure or blood glucose at home?  yes Patient checks blood pressure once a day. Pulse is ~ 60s-70s, average BP is 110-120/80. Patient has been checking weight daily and it's averaging between 177-179 lbs.   Does the patient have any problems obtaining medications due to transportation or finances?   no  Understanding of regimen: excellent Understanding of indications: good Potential of compliance: good - patient missed a few doses of brilinta over the holiday when he ran out. Other than that, he has not missed any other doses of medications.    Pharmacist comments: Patient experiencing shortness of breath with the Brilinta. We discussed that the side-effect with Brilinta can subside with time. Patient agreed and wants to try staying on Brilinta for one more month. If the side effect persists, we discussed other antiplatelet agents such as prasugrel as another option if therapy needs to be changed.   Alvia Grove, PharmD PGY1 Acute Care Pharmacy Resident 02/26/2019 6:21 PM

## 2019-02-27 ENCOUNTER — Encounter: Payer: Self-pay | Admitting: Family Medicine

## 2019-02-27 ENCOUNTER — Other Ambulatory Visit: Payer: Self-pay | Admitting: Family Medicine

## 2019-02-27 MED ORDER — CLONAZEPAM 0.5 MG PO TABS: 0.2500 mg | ORAL_TABLET | Freq: Two times a day (BID) | ORAL | 0 refills | Status: DC | PRN

## 2019-03-09 ENCOUNTER — Other Ambulatory Visit: Payer: Self-pay

## 2019-03-09 ENCOUNTER — Encounter (HOSPITAL_COMMUNITY)
Admission: RE | Admit: 2019-03-09 | Discharge: 2019-03-09 | Disposition: A | Discharge status: Home / Self Care | Payer: Managed Care, Other (non HMO) | Source: Ambulatory Visit | Attending: Cardiovascular Disease | Admitting: Cardiovascular Disease

## 2019-03-09 ENCOUNTER — Telehealth (HOSPITAL_COMMUNITY): Payer: Self-pay | Admitting: *Deleted

## 2019-03-09 DIAGNOSIS — Z955 Presence of coronary angioplasty implant and graft: Secondary | ICD-10-CM | POA: Insufficient documentation

## 2019-03-09 NOTE — Telephone Encounter (Signed)
Spoke with patient confirmed appointment for orientation. Completed health history over the phone. Pt is interested in participating in Virtual Cardiac and Pulmonary Rehab. Pt advised that Virtual Cardiac and Pulmonary Rehab is provided at no cost to the patient.  Checklist:  1. Pt has smart device  ie smartphone and/or ipad for downloading an app  Yes 2. Reliable internet/wifi service    Yes 3. Understands how to use their smartphone and navigate within an app.  Yes   Pt verbalized understanding and is in agreement.            Confirm Consent - In the setting of the current Covid19 crisis, you are scheduled for a phone visit with your Cardiac or Pulmonary team member.  Just as we do with many in-gym visits, in order for you to participate in this visit, we must obtain consent.  If you'd like, I can send this to your mychart (if signed up) or email for you to review.  Otherwise, I can obtain your verbal consent now.  By agreeing to a telephone visit, we'd like you to understand that the technology does not allow for your Cardiac or Pulmonary Rehab team member to perform a physical assessment, and thus may limit their ability to fully assess your ability to perform exercise programs. If your provider identifies any concerns that need to be evaluated in person, we will make arrangements to do so.  Finally, though the technology is pretty good, we cannot assure that it will always work on either your or our end and we cannot ensure that we have a secure connection.  Cardiac and Pulmonary Rehab Telehealth visits and "At Home" cardiac and pulmonary rehab are provided at no cost to you.        Are you willing to proceed?"        STAFF: Did the patient verbally acknowledge consent to telehealth visit? Document YES/NO here: Yes     Gladstone Lighter RN  Cardiac and Pulmonary Rehab Staff        Date 03/09/19    @ Time 1209

## 2019-03-10 ENCOUNTER — Other Ambulatory Visit: Payer: Self-pay

## 2019-03-10 ENCOUNTER — Encounter (HOSPITAL_COMMUNITY): Payer: Self-pay

## 2019-03-10 ENCOUNTER — Encounter (HOSPITAL_COMMUNITY)
Admission: RE | Admit: 2019-03-10 | Discharge: 2019-03-10 | Disposition: A | Discharge status: Home / Self Care | Payer: Managed Care, Other (non HMO) | Source: Ambulatory Visit | Attending: Cardiovascular Disease | Admitting: Cardiovascular Disease

## 2019-03-10 VITALS — BP 106/70 | HR 78 | Temp 96.3°F | Ht 71.25 in | Wt 181.9 lb

## 2019-03-10 DIAGNOSIS — Z955 Presence of coronary angioplasty implant and graft: Secondary | ICD-10-CM

## 2019-03-10 NOTE — Progress Notes (Signed)
Cardiac Individual Treatment Plan  Patient Details  Name: Austin [name] MRN: [id number] Date of Birth: 1966/02/07 Referring Provider:     CARDIAC REHAB PHASE II ORIENTATION from 03/10/2019 in [facility] CARDIAC REHAB  Referring Provider  Charlton Haws, MD      Initial Encounter Date:    CARDIAC REHAB PHASE II ORIENTATION from 03/10/2019 in [facility] CARDIAC REHAB  Date  03/10/19      Visit Diagnosis: Status post coronary artery stent placement  Patient's Home Medications on Admission:  Current Outpatient Medications:  .  carvedilol (COREG) 12.5 MG tablet, Take 1.5 tablets (18.75 mg total) by mouth 2 (two) times daily with a meal., Disp: 270 tablet, Rfl: 3 .  acetaminophen (TYLENOL) 500 MG tablet, Take 1,000 mg 2 (two) times daily as needed by mouth for moderate pain., Disp: , Rfl:  .  aspirin EC 81 MG tablet, Take 1 tablet (81 mg total) by mouth daily., Disp: 30 tablet, Rfl: 11 .  atorvastatin (LIPITOR) 80 MG tablet, Take 1 tablet (80 mg total) by mouth daily., Disp: 30 tablet, Rfl: 11 .  clonazePAM (KLONOPIN) 0.5 MG tablet, Take 0.5-1 tablets (0.25-0.5 mg total) by mouth 2 (two) times daily as needed for anxiety., Disp: 60 tablet, Rfl: 0 .  MELATONIN PO, Take 1 tablet by mouth at bedtime as needed (sleep)., Disp: , Rfl:  .  omeprazole (PRILOSEC OTC) 20 MG tablet, Take 20 mg by mouth daily., Disp: , Rfl:  .  sacubitril-valsartan (ENTRESTO) 97-103 MG, Take 1 tablet by mouth 2 (two) times daily., Disp: 60 tablet, Rfl: 11 .  spironolactone (ALDACTONE) 25 MG tablet, Take 1 tablet (25 mg total) by mouth daily., Disp: 90 tablet, Rfl: 3 .  ticagrelor (BRILINTA) 90 MG TABS tablet, Take 1 tablet (90 mg total) by mouth 2 (two) times daily., Disp: 60 tablet, Rfl: 6  Past Medical History: Past Medical History:  Diagnosis Date  . Anxiety   . Gastric ulcer   . GERD (gastroesophageal reflux disease)   . Guillain Barr syndrome (HCC) 1984   COMPLETE  RECOVERY; triggered from flu illness  . Headache(784.0)    hx of tension-none recent  . Hematemesis 09/07/2016  . History of kidney stones 09/19/12   left ureteral stone-SURGERY 8/6 URETEROSCOPY / STENT PLACEMENT   . Partial gastric outlet obstruction   . UGIB (upper gastrointestinal bleed) 09/07/2016    Tobacco Use: Social History   Tobacco Use  Smoking Status Former Smoker  . Packs/day: 1.50  . Years: 28.00  . Pack years: 42.00  . Types: Cigarettes  . Quit date: 05/02/2018  . Years since quitting: 0.8  Smokeless Tobacco Never Used    Labs: Recent Review Flowsheet Data    Labs for ITP Cardiac and Pulmonary Rehab Latest Ref Rng & Units 06/02/2018 06/05/2018 01/21/2019 01/21/2019   Cholestrol 0 - 200 mg/dL 970(Y) - - -   LDLCALC 0 - 99 mg/dL 637(C) - - -   HDL >58.85 mg/dL 02.77(A) - - -   Trlycerides 0.0 - 149.0 mg/dL 128.7 - - -   PHART 8.676 - 7.450 - - 7.368 -   PCO2ART 32.0 - 48.0 mmHg - - 44.5 -   HCO3 20.0 - 28.0 mmol/L - 24.8 25.6 26.6   TCO2 22 - 32 mmol/L - - 27 28   O2SAT % - 95.2 94.0 70.0      Capillary Blood Glucose: No results found for: GLUCAP   Exercise Target Goals: Exercise Program Goal:  Individual exercise prescription set using results from initial 6 min walk test and THRR while considering  patient's activity barriers and safety.   Exercise Prescription Goal: Initial exercise prescription builds to 30-45 minutes a day of aerobic activity, 2-3 days per week.  Home exercise guidelines will be given to patient during program as part of exercise prescription that the participant will acknowledge.  Activity Barriers & Risk Stratification: Activity Barriers & Cardiac Risk Stratification - 03/10/19 1022      Activity Barriers & Cardiac Risk Stratification   Activity Barriers  None    Cardiac Risk Stratification  High       6 Minute Walk: 6 Minute Walk    Row Name 03/10/19 1013         6 Minute Walk   Phase  Initial     Distance  1706 feet      Walk Time  6 minutes     # of Rest Breaks  0     MPH  3.23     METS  4.56     RPE  9     Perceived Dyspnea   0     VO2 Peak  15.97     Symptoms  No     Resting HR  78 bpm     Resting BP  106/70     Resting Oxygen Saturation   98 %     Exercise Oxygen Saturation  during 6 min walk  98 %     Max Ex. HR  87 bpm     Max Ex. BP  104/60     2 Minute Post BP  102/60        Oxygen Initial Assessment:   Oxygen Re-Evaluation:   Oxygen Discharge (Final Oxygen Re-Evaluation):   Initial Exercise Prescription: Initial Exercise Prescription - 03/10/19 1300      Date of Initial Exercise RX and Referring Provider   Date  03/10/19    Referring Provider  Jenkins Rouge, MD      Track   Minutes  60      Prescription Details   Frequency (times per week)  5-7    Duration  --   60 minutes of aerobic exercise without signs/symptoms     Intensity   THRR 40-80% of Max Heartrate  68-135    Ratings of Perceived Exertion  11-13    Perceived Dyspnea  0-4      Progression   Progression  Continue to progress workloads to maintain intensity without signs/symptoms of physical distress.      Resistance Training   Training Prescription  Yes    Reps  10-15       Perform Capillary Blood Glucose checks as needed.  Exercise Prescription Changes:   Exercise Comments:  Exercise Comments    Row Name 03/10/19 1022           Exercise Comments  Reviewed home exercise plan.          Exercise Goals and Review:  Exercise Goals    Row Name 03/10/19 1025             Exercise Goals   Increase Physical Activity  Yes       Intervention  Provide advice, education, support and counseling about physical activity/exercise needs.;Develop an individualized exercise prescription for aerobic and resistive training based on initial evaluation findings, risk stratification, comorbidities and participant's personal goals.       Expected Outcomes  Short Term: Attend rehab on  a regular basis to  increase amount of physical activity.;Long Term: Exercising regularly at least 3-5 days a week.;Long Term: Add in home exercise to make exercise part of routine and to increase amount of physical activity.       Increase Strength and Stamina  Yes       Intervention  Provide advice, education, support and counseling about physical activity/exercise needs.;Develop an individualized exercise prescription for aerobic and resistive training based on initial evaluation findings, risk stratification, comorbidities and participant's personal goals.       Expected Outcomes  Short Term: Increase workloads from initial exercise prescription for resistance, speed, and METs.;Short Term: Perform resistance training exercises routinely during rehab and add in resistance training at home;Long Term: Improve cardiorespiratory fitness, muscular endurance and strength as measured by increased METs and functional capacity ( )       Able to understand and use rate of perceived exertion (RPE) scale  Yes       Intervention  Provide education and explanation on how to use RPE scale       Expected Outcomes  Short Term: Able to use RPE daily in rehab to express subjective intensity level;Long Term:  Able to use RPE to guide intensity level when exercising independently       Knowledge and understanding of Target Heart Rate Range (THRR)  Yes       Intervention  Provide education and explanation of THRR including how the numbers were predicted and where they are located for reference       Expected Outcomes  Short Term: Able to state/look up THRR;Long Term: Able to use THRR to govern intensity when exercising independently;Short Term: Able to use daily as guideline for intensity in rehab       Able to check pulse independently  Yes       Intervention  Provide education and demonstration on how to check pulse in carotid and radial arteries.;Review the importance of being able to check your own pulse for safety during independent  exercise       Expected Outcomes  Short Term: Able to explain why pulse checking is important during independent exercise;Long Term: Able to check pulse independently and accurately       Understanding of Exercise Prescription  Yes       Intervention  Provide education, explanation, and written materials on patient's individual exercise prescription       Expected Outcomes  Short Term: Able to explain program exercise prescription;Long Term: Able to explain home exercise prescription to exercise independently          Exercise Goals Re-Evaluation :   Discharge Exercise Prescription (Final Exercise Prescription Changes):   Nutrition:  Target Goals: Understanding of nutrition guidelines, daily intake of sodium 1500mg , cholesterol 200mg , calories 30% from fat and 7% or less from saturated fats, daily to have 5 or more servings of fruits and vegetables.  Biometrics: Pre Biometrics - 03/10/19 1012      Pre Biometrics   Waist Circumference  38 inches    Hip Circumference  38.5 inches    Waist to Hip Ratio  0.99 %    Triceps Skinfold  13 mm    % Body Fat  24.3 %    Grip Strength  32.5 kg    Flexibility  13.75 in    Single Leg Stand  30 seconds        Nutrition Therapy Plan and Nutrition Goals:   Nutrition Assessments:   Nutrition Goals Re-Evaluation:  Nutrition Goals Re-Evaluation:   Nutrition Goals Discharge (Final Nutrition Goals Re-Evaluation):   Psychosocial: Target Goals: Acknowledge presence or absence of significant depression and/or stress, maximize coping skills, provide positive support system. Participant is able to verbalize types and ability to use techniques and skills needed for reducing stress and depression.  Initial Review & Psychosocial Screening: Initial Psych Review & Screening - 03/10/19 1140      Initial Review   Current issues with  Current Anxiety/Panic      Family Dynamics   Good Support System?  Yes    Comments  Mr. Mccorkel feels  confident in his recovery and has a positive attitude and outlook. He admits to mild anxiety related to his possible need for an ICD if his EF does not recover. He works out of town and eager to participate in the [facility] Cardiac [facility] App. He is married and has multiple step-children that provide emotional support. He enjoys playing guitar and uses this as a stress outlet. Although he does have mild anxiety, this does not present a barrier to self care/health management. No interventions needed at this time.      Barriers   Psychosocial barriers to participate in program  There are no identifiable barriers or psychosocial needs.      Screening Interventions   Interventions  Encouraged to exercise       Quality of Life Scores: Quality of Life - 03/10/19 1323      Quality of Life   Select  Quality of Life      Quality of Life Scores   Health/Function Pre  26.8 %    Socioeconomic Pre  29.64 %    Psych/Spiritual Pre  28.29 %    Family Pre  30 %    GLOBAL Pre  28.16 %      Scores of 19 and below usually indicate a poorer quality of life in these areas.  A difference of  2-3 points is a clinically meaningful difference.  A difference of 2-3 points in the total score of the Quality of Life Index has been associated with significant improvement in overall quality of life, self-image, physical symptoms, and general health in studies assessing change in quality of life.  PHQ-9: Recent Review Flowsheet Data    Depression screen [facility] 2/9 03/10/2019 12/19/2018   Decreased Interest 0 0   Down, Depressed, Hopeless 0 0   PHQ - 2 Score 0 0     Interpretation of Total Score  Total Score Depression Severity:  1-4 = Minimal depression, 5-9 = Mild depression, 10-14 = Moderate depression, 15-19 = Moderately severe depression, 20-27 = Severe depression   Psychosocial Evaluation and Intervention:   Psychosocial Re-Evaluation:   Psychosocial Discharge (Final Psychosocial  Re-Evaluation):   Vocational Rehabilitation: Provide vocational rehab assistance to qualifying candidates.   Vocational Rehab Evaluation & Intervention:   Education: Education Goals: Education classes will be provided on a weekly basis, covering required topics. Participant will state understanding/return demonstration of topics presented.  Learning Barriers/Preferences:   Education Topics: Count Your Pulse:  -Group instruction provided by verbal instruction, demonstration, patient participation and written materials to support subject.  Instructors address importance of being able to find your pulse and how to count your pulse when at home without a heart monitor.  Patients get hands on experience counting their pulse with staff help and individually.   Heart Attack, Angina, and Risk Factor Modification:  -Group instruction provided by verbal instruction, video, and written materials to  support subject.  Instructors address signs and symptoms of angina and heart attacks.    Also discuss risk factors for heart disease and how to make changes to improve heart health risk factors.   Functional Fitness:  -Group instruction provided by verbal instruction, demonstration, patient participation, and written materials to support subject.  Instructors address safety measures for doing things around the house.  Discuss how to get up and down off the floor, how to pick things up properly, how to safely get out of a chair without assistance, and balance training.   Meditation and Mindfulness:  -Group instruction provided by verbal instruction, patient participation, and written materials to support subject.  Instructor addresses importance of mindfulness and meditation practice to help reduce stress and improve awareness.  Instructor also leads participants through a meditation exercise.    Stretching for Flexibility and Mobility:  -Group instruction provided by verbal instruction, patient  participation, and written materials to support subject.  Instructors lead participants through series of stretches that are designed to increase flexibility thus improving mobility.  These stretches are additional exercise for major muscle groups that are typically performed during regular warm up and cool down.   Hands Only CPR:  -Group verbal, video, and participation provides a basic overview of AHA guidelines for community CPR. Role-play of emergencies allow participants the opportunity to practice calling for help and chest compression technique with discussion of AED use.   Hypertension: -Group verbal and written instruction that provides a basic overview of hypertension including the most recent diagnostic guidelines, risk factor reduction with self-care instructions and medication management.    Nutrition I class: Heart Healthy Eating:  -Group instruction provided by PowerPoint slides, verbal discussion, and written materials to support subject matter. The instructor gives an explanation and review of the Therapeutic Lifestyle Changes diet recommendations, which includes a discussion on lipid goals, dietary fat, sodium, fiber, plant stanol/sterol esters, sugar, and the components of a well-balanced, healthy diet.   Nutrition II class: Lifestyle Skills:  -Group instruction provided by PowerPoint slides, verbal discussion, and written materials to support subject matter. The instructor gives an explanation and review of label reading, grocery shopping for heart health, heart healthy recipe modifications, and ways to make healthier choices when eating out.   Diabetes Question & Answer:  -Group instruction provided by PowerPoint slides, verbal discussion, and written materials to support subject matter. The instructor gives an explanation and review of diabetes co-morbidities, pre- and post-prandial blood glucose goals, pre-exercise blood glucose goals, signs, symptoms, and treatment of  hypoglycemia and hyperglycemia, and foot care basics.   Diabetes Blitz:  -Group instruction provided by PowerPoint slides, verbal discussion, and written materials to support subject matter. The instructor gives an explanation and review of the physiology behind type 1 and type 2 diabetes, diabetes medications and rational behind using different medications, pre- and post-prandial blood glucose recommendations and Hemoglobin A1c goals, diabetes diet, and exercise including blood glucose guidelines for exercising safely.    Portion Distortion:  -Group instruction provided by PowerPoint slides, verbal discussion, written materials, and food models to support subject matter. The instructor gives an explanation of serving size versus portion size, changes in portions sizes over the last 20 years, and what consists of a serving from each food group.   Stress Management:  -Group instruction provided by verbal instruction, video, and written materials to support subject matter.  Instructors review role of stress in heart disease and how to cope with stress positively.  Exercising on Your Own:  -Group instruction provided by verbal instruction, power point, and written materials to support subject.  Instructors discuss benefits of exercise, components of exercise, frequency and intensity of exercise, and end points for exercise.  Also discuss use of nitroglycerin and activating EMS.  Review options of places to exercise outside of rehab.  Review guidelines for sex with heart disease.   Cardiac Drugs I:  -Group instruction provided by verbal instruction and written materials to support subject.  Instructor reviews cardiac drug classes: antiplatelets, anticoagulants, beta blockers, and statins.  Instructor discusses reasons, side effects, and lifestyle considerations for each drug class.   Cardiac Drugs II:  -Group instruction provided by verbal instruction and written materials to support subject.   Instructor reviews cardiac drug classes: angiotensin converting enzyme inhibitors (ACE-I), angiotensin II receptor blockers (ARBs), nitrates, and calcium channel blockers.  Instructor discusses reasons, side effects, and lifestyle considerations for each drug class.   Anatomy and Physiology of the Circulatory System:  Group verbal and written instruction and models provide basic cardiac anatomy and physiology, with the coronary electrical and arterial systems. Review of: AMI, Angina, Valve disease, Heart Failure, Peripheral Artery Disease, Cardiac Arrhythmia, Pacemakers, and the ICD.   Other Education:  -Group or individual verbal, written, or video instructions that support the educational goals of the cardiac rehab program.   Holiday Eating Survival Tips:  -Group instruction provided by PowerPoint slides, verbal discussion, and written materials to support subject matter. The instructor gives patients tips, tricks, and techniques to help them not only survive but enjoy the holidays despite the onslaught of food that accompanies the holidays.   Knowledge Questionnaire Score: Knowledge Questionnaire Score - 03/10/19 1323      Knowledge Questionnaire Score   Pre Score  22/24       Core Components/Risk Factors/Patient Goals at Admission: Personal Goals and Risk Factors at Admission - 03/10/19 1324      Core Components/Risk Factors/Patient Goals on Admission   Stress  Yes    Intervention  Offer individual and/or small group education and counseling on adjustment to heart disease, stress management and health-related lifestyle change. Teach and support self-help strategies.    Expected Outcomes  Short Term: Participant demonstrates changes in health-related behavior, relaxation and other stress management skills, ability to obtain effective social support, and compliance with psychotropic medications if prescribed.;Long Term: Emotional wellbeing is indicated by absence of clinically  significant psychosocial distress or social isolation.       Core Components/Risk Factors/Patient Goals Review:    Core Components/Risk Factors/Patient Goals at Discharge (Final Review):    ITP Comments: ITP Comments    Row Name 03/10/19 1011 03/10/19 1342         ITP Comments  Dr. Armanda Magic, Medical Director Cardiac Rehab [facility]  -         Comments: Patient attended orientation on 03/10/2019 to review rules and guidelines for program.  Completed 6 minute walk test, Intitial ITP, and exercise prescription.  VSS. Telemetry-NSR with rare PVCs.  Asymptomatic. Safety measures and social distancing in place per CDC guidelines. Patient assisted with downloading the [facility] Virtual CR app. Will follow up tomorrow for continued assessment.

## 2019-03-10 NOTE — Progress Notes (Signed)
Reviewed home exercise guidelines with patient including endpoints, temperature precautions, target heart rate and rate of perceived exertion. Pt is walking 1-1.5 hours (2-4 miles) 5 days/week as his mode of home exercise. Pt voices understanding of instructions given.  [name], MS, ACSM CEP

## 2019-03-11 ENCOUNTER — Encounter (HOSPITAL_COMMUNITY)
Admission: RE | Admit: 2019-03-11 | Discharge: 2019-03-11 | Disposition: A | Discharge status: Home / Self Care | Payer: Self-pay | Source: Ambulatory Visit | Attending: Cardiovascular Disease | Admitting: Cardiovascular Disease

## 2019-03-11 DIAGNOSIS — Z955 Presence of coronary angioplasty implant and graft: Secondary | ICD-10-CM | POA: Insufficient documentation

## 2019-03-11 NOTE — Progress Notes (Signed)
Virtual Cardiac Rehab Note:   Successful telephone encounter to [name] to follow up on ability to utilize [facility] virtual cardiac rehab app and to assess for additional questions. [name] has successfully logged his first exercise session. [name] has a diagnosis of CHF with low EF and part of his daily plan of care is to monitor his weight. During todays telephone visit, virtual CR plan of care was updated to include daily weights and pre and post BP as [name] has the ability to monitor his BP at home. He is encouraged to check BP at various times and record 3 x week.  Plan: will continue to monitor patients home exercise and CR plan of care.  Austin [name] E. Vaughan Basta, BSN

## 2019-03-13 ENCOUNTER — Encounter (HOSPITAL_COMMUNITY): Payer: Self-pay | Admitting: Cardiology

## 2019-03-13 ENCOUNTER — Other Ambulatory Visit: Payer: Self-pay

## 2019-03-13 ENCOUNTER — Ambulatory Visit (HOSPITAL_COMMUNITY)
Admission: RE | Admit: 2019-03-13 | Discharge: 2019-03-13 | Disposition: A | Discharge status: Home / Self Care | Payer: Managed Care, Other (non HMO) | Source: Ambulatory Visit | Attending: Cardiology | Admitting: Cardiology

## 2019-03-13 VITALS — BP 148/90 | HR 75 | Wt 184.0 lb

## 2019-03-13 DIAGNOSIS — Z7982 Long term (current) use of aspirin: Secondary | ICD-10-CM | POA: Diagnosis not present

## 2019-03-13 DIAGNOSIS — I2510 Atherosclerotic heart disease of native coronary artery without angina pectoris: Secondary | ICD-10-CM | POA: Diagnosis not present

## 2019-03-13 DIAGNOSIS — Z87891 Personal history of nicotine dependence: Secondary | ICD-10-CM | POA: Insufficient documentation

## 2019-03-13 DIAGNOSIS — Z79899 Other long term (current) drug therapy: Secondary | ICD-10-CM | POA: Insufficient documentation

## 2019-03-13 DIAGNOSIS — Z7901 Long term (current) use of anticoagulants: Secondary | ICD-10-CM | POA: Insufficient documentation

## 2019-03-13 DIAGNOSIS — K219 Gastro-esophageal reflux disease without esophagitis: Secondary | ICD-10-CM | POA: Insufficient documentation

## 2019-03-13 DIAGNOSIS — Z8249 Family history of ischemic heart disease and other diseases of the circulatory system: Secondary | ICD-10-CM | POA: Insufficient documentation

## 2019-03-13 DIAGNOSIS — I5022 Chronic systolic (congestive) heart failure: Secondary | ICD-10-CM | POA: Insufficient documentation

## 2019-03-13 LAB — BASIC METABOLIC PANEL
Anion gap: 8 (ref 5–15)
BUN: 17 mg/dL (ref 6–20)
CO2: 24 mmol/L (ref 22–32)
Calcium: 8.9 mg/dL (ref 8.9–10.3)
Chloride: 108 mmol/L (ref 98–111)
Creatinine, Ser: 0.61 mg/dL (ref 0.61–1.24)
GFR calc Af Amer: >60 mL/min (ref >60)
GFR calc non Af Amer: >60 mL/min (ref >60)
Glucose, Bld: 91 mg/dL (ref 70–99)
Potassium: 4 mmol/L (ref 3.5–5.1)
Sodium: 140 mmol/L (ref 135–145)

## 2019-03-13 MED ORDER — CARVEDILOL 25 MG PO TABS: 25.0000 mg | ORAL_TABLET | Freq: Two times a day (BID) | ORAL | 5 refills | Status: DC

## 2019-03-13 NOTE — Patient Instructions (Signed)
INCREASE Coreg (Carvedilol) to 25mg  (1 tab) twice a day    Labs today We will only contact you if something comes back abnormal or we need to make some changes. Otherwise no news is good news!   Your physician recommends that you schedule a follow-up appointment in: 3 weeks with the Pharmacist Lauren   Your physician has requested that you have an echocardiogram. Echocardiography is a painless test that uses sound waves to create images of your heart. It provides your doctor with information about the size and shape of your heart and how well your heart's chambers and valves are working. This procedure takes approximately one hour. There are no restrictions for this procedure.   Your physician recommends that you schedule a follow-up appointment in: 2 month with Dr with an ECHO   Please call office at [phone] option [phone] if you have any questions or concerns.    At the [facility], you and your health needs are our priority. As part of our continuing mission to provide you with exceptional heart care, we have created designated Provider Care Teams. These Care Teams include your primary Cardiologist (physician) and Advanced Practice Providers (APPs- Physician Assistants and Nurse Practitioners) who all work together to provide you with the care you need, when you need it.   You may see any of the following providers on your designated Care Team at your next follow up: [phone] Dr Marland Kitchen . Dr Arvilla Meres . Marca Ancona, NP . Tonye Becket, PA . Robbie Lis, PharmD   Please be sure to bring in all your medications bottles to every appointment.

## 2019-03-14 NOTE — Progress Notes (Signed)
PCP: Wynn Banker, MD Cardiology: Dr. Eden Emms HF Cardiology: Dr. Shirlee Latch  54 y.o. with history of chronic systolic CHF and CAD returns for followup of CHF.  Patient was in his usual state of health until around 4/20.  He was admitted at that time with acute CHF, had been short of breath and orthopneic at home.  No chest pain.  At admission, he was found to be volume overloaded with echo showing EF 20%. He was started on cardiac meds and discharged.  Cardiac MRI in 10/20 showed EF 24%, moderately decreased RV systolic function, and full thickness LGE in the basal-mid inferior wall concerning for prior MI. He then had right and left heart cath in 12/20. This showed normal filling pressures and cardiac output.  He had DES to mid LCx/OM2 and distal RCA.   He is doing well today.  He is walking 2-4 miles/day.  No significant exertional dyspnea or chest pain.  No lightheadedness.  Tolerating all meds.  Weight up 2 lbs.  Working full time.   Labs (11/20): K 4.4, creatinine 0.68, hgb 13.5 Labs (1/21): K 3.9, creatinine 0.77   PMH: 1. H/o Guillain Barre syndrome with recovery in 1984.  2. GERD 3. Nephrolithiasis 4. PUD 5. 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  Echo (4/20) with EF 20%, moderate to severe LV dilation, moderately decreased RV systolic function, mild-moderate MR.  - Cardiac MRI (10/20): EF 24%, severe LV dilation, diffuse hypokinesis, full thickness scar basal-mid inferolateral wall suggestive of prior MI.  - LHC/RHC (12/20): 90% mLCx/OM2 stenosis treated with DES, 80% distal RCA stenosis treated with DES.  Mean RA 1, mean PA 14, mean PCWP 9, CI 2.63.  6. Prior smoker.  7. CAD: LHC (12/20) with 90% mLCx/OM2 stenosis treated with DES, 80% distal RCA stenosis treated with DES.   FH: Mother with CHF, father with PCI, brother with PCI.   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  No ETOH. Married, lives in [place].  Plays in a classic  rock band.   ROS: All systems reviewed and negative except as per HPI.   Current Outpatient Medications  Medication Sig Dispense Refill  . acetaminophen (TYLENOL) 500 MG tablet Take 1,000 mg 2 (two) times daily as needed by mouth for moderate pain.    Marland Kitchen aspirin EC 81 MG tablet Take 1 tablet (81 mg total) by mouth daily. 30 tablet 11  . atorvastatin (LIPITOR) 80 MG tablet Take 1 tablet (80 mg total) by mouth daily. 30 tablet 11  . carvedilol (COREG) 25 MG tablet Take 1 tablet (25 mg total) by mouth 2 (two) times daily with a meal. 60 tablet 5  . clonazePAM (KLONOPIN) 0.5 MG tablet Take 0.5-1 tablets (0.25-0.5 mg total) by mouth 2 (two) times daily as needed for anxiety. 60 tablet 0  . MELATONIN PO Take 1 tablet by mouth at bedtime as needed (sleep).    Marland Kitchen omeprazole (PRILOSEC OTC) 20 MG tablet Take 20 mg by mouth daily.    . sacubitril-valsartan (ENTRESTO) 97-103 MG Take 1 tablet by mouth 2 (two) times daily. 60 tablet 11  . spironolactone (ALDACTONE) 25 MG tablet Take 1 tablet (25 mg total) by mouth daily. 90 tablet 3  . ticagrelor (BRILINTA) 90 MG TABS tablet Take 1 tablet (90 mg total) by mouth 2 (two) times daily. 60 tablet 6   No current facility-administered medications for this encounter.   BP (!) 148/90   Pulse 75   Wt 83.5  kg (184 lb)   SpO2 99%   BMI 25.48 kg/m  General: NAD Neck: No JVD, no thyromegaly or thyroid nodule.  Lungs: Clear to auscultation bilaterally with normal respiratory effort. CV: Nondisplaced PMI.  Heart regular S1/S2, no S3/S4, no murmur.  No peripheral edema.  No carotid bruit.  Normal pedal pulses.  Abdomen: Soft, nontender, no hepatosplenomegaly, no distention.  Skin: Intact without lesions or rashes.  Neurologic: Alert and oriented x 3.  Psych: Normal affect. Extremities: No clubbing or cyanosis.  HEENT: Normal.   Assessment/Plan: 1. Chronic systolic CHF: Suspect mixed ischemic/nonischemic cardiomyopathy.  I do not think that his CAD can explain  the extent of the cardiomyopathy. Possible co-existing viral myocarditis though signs of this were not seen on MRI (cannot rule out).  No heavy ETOH or drug use.  Mother with CHF at an advanced age.  NYHA class II symptoms.  Not volume overloaded on exam.  - Increase Coreg to 25 mg bid.  - Continue Entresto 97/103 bid.  - Continue spironolactone 25 mg daily.  BMET today.  - Followup in 3 wks with HF pharmacist to start dapagliflozin.  - Repeat echo at followup appt in 2 months to decide on need for ICD.  Narrow QRS so not CRT candidate.  2. CAD: 12/20 PCI to mid LCx/OM2 and distal RCA.  As above, I do not think CAD alone can account for his cardiomyopathy (though suspect it plays a role).   - Continue ASA 81 daily.  - Continue statin, check lipids/LFTs next appt.   Followup in 3 wks with HF pharmacist and 2 months with me.   Loralie Champagne 03/14/2019

## 2019-03-18 ENCOUNTER — Encounter (HOSPITAL_COMMUNITY)
Admission: RE | Admit: 2019-03-18 | Discharge: 2019-03-18 | Disposition: A | Discharge status: Home / Self Care | Payer: Managed Care, Other (non HMO) | Source: Ambulatory Visit | Attending: Cardiovascular Disease | Admitting: Cardiovascular Disease

## 2019-03-18 DIAGNOSIS — Z955 Presence of coronary angioplasty implant and graft: Secondary | ICD-10-CM | POA: Insufficient documentation

## 2019-03-18 NOTE — Progress Notes (Signed)
Virtual Visit: Cardiac Rehab  Spoke with patient today to assess progress with exercise. Patient is walking consistently at home and logging his exercise in the [employer] app without any issues. Patient travels for work and uses the fitness center in the hotel he's staying at if there's one onsite. Patient asked about skipping exercise if he's not at a location with a fitness center and no safe area to walk, and I reassured patient that it would be completely acceptable.Patient doesn't have any questions/concerns about his exercise routine at this time. Will follow-up next week.  [name], MS, ACSM CEP

## 2019-03-20 ENCOUNTER — Inpatient Hospital Stay (HOSPITAL_COMMUNITY)
Admission: RE | Admit: 2019-03-20 | Discharge: 2019-03-20 | Disposition: A | Discharge status: Home / Self Care | Payer: Managed Care, Other (non HMO) | Source: Ambulatory Visit

## 2019-03-20 ENCOUNTER — Other Ambulatory Visit: Payer: Self-pay

## 2019-03-20 NOTE — Progress Notes (Signed)
Austin [name] 54 y.o. male Nutrition Note - VIRTUAL CARDIAC REHAB  Visit Diagnosis: Status post coronary artery stent placement   Nutrition Note  Spoke with pt. Nutrition Plan and Nutrition Survey goals reviewed with pt. Pt is trying to follow a heart healthy diet.  Reviewed components of heart healthy diet. Recommended increasing fiber, eating out and low sodium, heart healthy snacks on the road, and decreasing soda/sugary beverage intake. Suggested G2 gatorade and flavored water. [name] feels these would work fine. [name] says his wife does the cooking a grocery shopping. She typically cooks with a lot of veggies and beans as she is vegetarian. He travels 2-3 days/nights per week and eats out. We discussed healthy choices.  Pt with dx of CHF. Per discussion, pt does use canned/convenience foods often. Pt does not add salt to food. Pt does eat out frequently.   Pt expressed understanding of the information reviewed.   Nutrition Diagnosis Food-and nutrition-related knowledge deficit related to lack of exposure to information as related to diagnosis of: ? CVD ?  Nutrition Intervention ? Pt's individual nutrition plan reviewed with pt. ? Benefits of adopting Heart Healthy diet discussed when [employer] reviewed.   ? Pt given handouts for: ? Nutrition I class  ? Continue client-centered nutrition education by RD, as part of interdisciplinary care.  Goal(s) ? Pt to build a healthy plate including vegetables, fruits, whole grains, and low-fat dairy products in a heart healthy meal plan.  Plan:    Will provide client-centered nutrition education as part of interdisciplinary care  Monitor and evaluate progress toward nutrition goal with team.   [name], MS, RDN, LDN

## 2019-03-27 ENCOUNTER — Other Ambulatory Visit: Payer: Self-pay

## 2019-03-27 ENCOUNTER — Encounter (HOSPITAL_COMMUNITY)
Admission: RE | Admit: 2019-03-27 | Discharge: 2019-03-27 | Disposition: A | Discharge status: Home / Self Care | Payer: Managed Care, Other (non HMO) | Source: Ambulatory Visit | Attending: Cardiovascular Disease | Admitting: Cardiovascular Disease

## 2019-03-27 ENCOUNTER — Telehealth (HOSPITAL_COMMUNITY): Payer: Self-pay | Admitting: *Deleted

## 2019-03-27 DIAGNOSIS — Z955 Presence of coronary angioplasty implant and graft: Secondary | ICD-10-CM | POA: Insufficient documentation

## 2019-03-27 NOTE — Telephone Encounter (Signed)
Spoke to [employer] regarding documenting of experiencing slight shortness of breath on the virtual better hearts APP that came on after a stressful situation at work. Austin [name] denies any symptoms today. Vital signs documented in the APP are [name]. Will continue to monitor on the better hearts APP.Gladstone Lighter, RN,BSN 03/27/2019 10:53 AM

## 2019-03-30 ENCOUNTER — Other Ambulatory Visit: Payer: Self-pay

## 2019-03-30 ENCOUNTER — Ambulatory Visit (HOSPITAL_COMMUNITY)
Admission: RE | Admit: 2019-03-30 | Discharge: 2019-03-30 | Disposition: A | Discharge status: Home / Self Care | Payer: Managed Care, Other (non HMO) | Source: Ambulatory Visit | Attending: Internal Medicine | Admitting: Internal Medicine

## 2019-03-30 ENCOUNTER — Telehealth (HOSPITAL_COMMUNITY): Payer: Self-pay | Admitting: Pharmacist

## 2019-03-30 ENCOUNTER — Encounter (HOSPITAL_COMMUNITY): Payer: Self-pay | Admitting: *Deleted

## 2019-03-30 ENCOUNTER — Encounter (HOSPITAL_COMMUNITY): Payer: Self-pay

## 2019-03-30 ENCOUNTER — Other Ambulatory Visit: Payer: Self-pay | Admitting: Family Medicine

## 2019-03-30 VITALS — BP 144/78 | HR 76 | Wt 184.6 lb

## 2019-03-30 DIAGNOSIS — Z79899 Other long term (current) drug therapy: Secondary | ICD-10-CM | POA: Insufficient documentation

## 2019-03-30 DIAGNOSIS — I5022 Chronic systolic (congestive) heart failure: Secondary | ICD-10-CM | POA: Diagnosis not present

## 2019-03-30 DIAGNOSIS — I2510 Atherosclerotic heart disease of native coronary artery without angina pectoris: Secondary | ICD-10-CM | POA: Insufficient documentation

## 2019-03-30 DIAGNOSIS — I428 Other cardiomyopathies: Secondary | ICD-10-CM | POA: Insufficient documentation

## 2019-03-30 DIAGNOSIS — R0602 Shortness of breath: Secondary | ICD-10-CM | POA: Insufficient documentation

## 2019-03-30 DIAGNOSIS — Z955 Presence of coronary angioplasty implant and graft: Secondary | ICD-10-CM

## 2019-03-30 DIAGNOSIS — I252 Old myocardial infarction: Secondary | ICD-10-CM | POA: Diagnosis not present

## 2019-03-30 DIAGNOSIS — Z7982 Long term (current) use of aspirin: Secondary | ICD-10-CM | POA: Insufficient documentation

## 2019-03-30 MED ORDER — FARXIGA 10 MG PO TABS: 10.0000 mg | ORAL_TABLET | Freq: Every day | ORAL | 11 refills | Status: DC

## 2019-03-30 NOTE — Progress Notes (Signed)
Cardiac Individual Treatment Plan  Patient Details  Name: Austin [name] MRN: [id number] Date of Birth: 09-Mar-1965 Referring Provider:     CARDIAC REHAB PHASE II ORIENTATION from 03/10/2019 in [facility] CARDIAC REHAB  Referring Provider  Charlton Haws, MD      Initial Encounter Date:    CARDIAC REHAB PHASE II ORIENTATION from 03/10/2019 in [facility] CARDIAC REHAB  Date  03/10/19      Visit Diagnosis: Status post coronary artery stent placement  Patient's Home Medications on Admission:  Current Outpatient Medications:  .  acetaminophen (TYLENOL) 500 MG tablet, Take 1,000 mg 2 (two) times daily as needed by mouth for moderate pain., Disp: , Rfl:  .  aspirin EC 81 MG tablet, Take 1 tablet (81 mg total) by mouth daily., Disp: 30 tablet, Rfl: 11 .  atorvastatin (LIPITOR) 80 MG tablet, Take 1 tablet (80 mg total) by mouth daily., Disp: 30 tablet, Rfl: 11 .  carvedilol (COREG) 25 MG tablet, Take 1 tablet (25 mg total) by mouth 2 (two) times daily with a meal., Disp: 60 tablet, Rfl: 5 .  clonazePAM (KLONOPIN) 0.5 MG tablet, Take 0.5-1 tablets (0.25-0.5 mg total) by mouth 2 (two) times daily as needed for anxiety., Disp: 60 tablet, Rfl: 1 .  dapagliflozin propanediol (FARXIGA) 10 MG TABS tablet, Take 10 mg by mouth daily before breakfast., Disp: 30 tablet, Rfl: 11 .  MELATONIN PO, Take 1 tablet by mouth at bedtime as needed (sleep)., Disp: , Rfl:  .  omeprazole (PRILOSEC OTC) 20 MG tablet, Take 20 mg by mouth daily., Disp: , Rfl:  .  sacubitril-valsartan (ENTRESTO) 97-103 MG, Take 1 tablet by mouth 2 (two) times daily., Disp: 60 tablet, Rfl: 11 .  spironolactone (ALDACTONE) 25 MG tablet, Take 1 tablet (25 mg total) by mouth daily., Disp: 90 tablet, Rfl: 3 .  ticagrelor (BRILINTA) 90 MG TABS tablet, Take 1 tablet (90 mg total) by mouth 2 (two) times daily., Disp: 60 tablet, Rfl: 6  Past Medical History: Past Medical History:  Diagnosis Date  .  Anxiety   . Gastric ulcer   . GERD (gastroesophageal reflux disease)   . Guillain Barr syndrome (HCC) 1984   COMPLETE RECOVERY; triggered from flu illness  . Headache(784.0)    hx of tension-none recent  . Hematemesis 09/07/2016  . History of kidney stones 09/19/12   left ureteral stone-SURGERY 8/6 URETEROSCOPY / STENT PLACEMENT   . Partial gastric outlet obstruction   . UGIB (upper gastrointestinal bleed) 09/07/2016    Tobacco Use: Social History   Tobacco Use  Smoking Status Former Smoker  . Packs/day: 1.50  . Years: 28.00  . Pack years: 42.00  . Types: Cigarettes  . Quit date: 05/02/2018  . Years since quitting: 0.9  Smokeless Tobacco Never Used    Labs: Recent Review Advice worker    Labs for ITP Cardiac and Pulmonary Rehab Latest Ref Rng & Units 06/02/2018 06/05/2018 01/21/2019 01/21/2019   Cholestrol 0 - 200 mg/dL 409(W) - - -   LDLCALC 0 - 99 mg/dL 119(J) - - -   HDL >47.82 mg/dL 95.62(Z) - - -   Trlycerides 0.0 - 149.0 mg/dL 308.6 - - -   PHART 5.784 - 7.450 - - 7.368 -   PCO2ART 32.0 - 48.0 mmHg - - 44.5 -   HCO3 20.0 - 28.0 mmol/L - 24.8 25.6 26.6   TCO2 22 - 32 mmol/L - - 27 28   O2SAT % - 95.2 94.0  70.0      Capillary Blood Glucose: No results found for: GLUCAP   Exercise Target Goals: Exercise Program Goal: Individual exercise prescription set using results from initial 6 min walk test and THRR while considering  patient's activity barriers and safety.   Exercise Prescription Goal: Starting with aerobic activity 30 plus minutes a day, 3 days per week for initial exercise prescription. Provide home exercise prescription and guidelines that participant acknowledges understanding prior to discharge.  Activity Barriers & Risk Stratification: Activity Barriers & Cardiac Risk Stratification - 03/10/19 1022      Activity Barriers & Cardiac Risk Stratification   Activity Barriers  None    Cardiac Risk Stratification  High       6 Minute Walk: 6 Minute  Walk    Row Name 03/10/19 1013         6 Minute Walk   Phase  Initial     Distance  1706 feet     Walk Time  6 minutes     # of Rest Breaks  0     MPH  3.23     METS  4.56     RPE  9     Perceived Dyspnea   0     VO2 Peak  15.97     Symptoms  No     Resting HR  78 bpm     Resting BP  106/70     Resting Oxygen Saturation   98 %     Exercise Oxygen Saturation  during 6 min walk  98 %     Max Ex. HR  87 bpm     Max Ex. BP  104/60     2 Minute Post BP  102/60        Oxygen Initial Assessment:   Oxygen Re-Evaluation:   Oxygen Discharge (Final Oxygen Re-Evaluation):   Initial Exercise Prescription: Initial Exercise Prescription - 03/10/19 1300      Date of Initial Exercise RX and Referring Provider   Date  03/10/19    Referring Provider  Charlton Haws, MD      Track   Minutes  60      Prescription Details   Frequency (times per week)  5-7    Duration  --   60 minutes of aerobic exercise without signs/symptoms     Intensity   THRR 40-80% of Max Heartrate  68-135    Ratings of Perceived Exertion  11-13    Perceived Dyspnea  0-4      Progression   Progression  Continue to progress workloads to maintain intensity without signs/symptoms of physical distress.      Resistance Training   Training Prescription  Yes    Reps  10-15       Perform Capillary Blood Glucose checks as needed.  Exercise Prescription Changes:  Exercise Prescription Changes    Row Name 03/29/19 1459             Response to Exercise   Blood Pressure (Admit)  102/63       Blood Pressure (Exit)  105/65       Heart Rate (Admit)  75 bpm       Heart Rate (Exercise)  110 bpm       Rating of Perceived Exertion (Exercise)  11       Symptoms  none       Comments  Virtual cardiac rehab session       Duration  Continue with 45  min of aerobic exercise without signs/symptoms of physical distress.       Intensity  THRR unchanged         Progression   Progression  Continue to progress  workloads to maintain intensity without signs/symptoms of physical distress.         Interval Training   Interval Training  No         Bike   Minutes  50         Track   Minutes  --         Home Exercise Plan   Plans to continue exercise at  Home (comment) Walking       Initial Home Exercises Provided  03/10/19          Exercise Comments:  Exercise Comments    Row Name 03/10/19 1022 03/30/19 1505         Exercise Comments  Reviewed home exercise plan.  Patient will be contacted to begin participation in the onsite cardiac rehab program after temporary closure due to COVID-19 pandemic.         Exercise Goals and Review:  Exercise Goals    Row Name 03/10/19 1025             Exercise Goals   Increase Physical Activity  Yes       Intervention  Provide advice, education, support and counseling about physical activity/exercise needs.;Develop an individualized exercise prescription for aerobic and resistive training based on initial evaluation findings, risk stratification, comorbidities and participant's personal goals.       Expected Outcomes  Short Term: Attend rehab on a regular basis to increase amount of physical activity.;Long Term: Exercising regularly at least 3-5 days a week.;Long Term: Add in home exercise to make exercise part of routine and to increase amount of physical activity.       Increase Strength and Stamina  Yes       Intervention  Provide advice, education, support and counseling about physical activity/exercise needs.;Develop an individualized exercise prescription for aerobic and resistive training based on initial evaluation findings, risk stratification, comorbidities and participant's personal goals.       Expected Outcomes  Short Term: Increase workloads from initial exercise prescription for resistance, speed, and METs.;Short Term: Perform resistance training exercises routinely during rehab and add in resistance training at home;Long Term: Improve  cardiorespiratory fitness, muscular endurance and strength as measured by increased METs and functional capacity ( )       Able to understand and use rate of perceived exertion (RPE) scale  Yes       Intervention  Provide education and explanation on how to use RPE scale       Expected Outcomes  Short Term: Able to use RPE daily in rehab to express subjective intensity level;Long Term:  Able to use RPE to guide intensity level when exercising independently       Knowledge and understanding of Target Heart Rate Range (THRR)  Yes       Intervention  Provide education and explanation of THRR including how the numbers were predicted and where they are located for reference       Expected Outcomes  Short Term: Able to state/look up THRR;Long Term: Able to use THRR to govern intensity when exercising independently;Short Term: Able to use daily as guideline for intensity in rehab       Able to check pulse independently  Yes       Intervention  Provide education and demonstration  on how to check pulse in carotid and radial arteries.;Review the importance of being able to check your own pulse for safety during independent exercise       Expected Outcomes  Short Term: Able to explain why pulse checking is important during independent exercise;Long Term: Able to check pulse independently and accurately       Understanding of Exercise Prescription  Yes       Intervention  Provide education, explanation, and written materials on patient's individual exercise prescription       Expected Outcomes  Short Term: Able to explain program exercise prescription;Long Term: Able to explain home exercise prescription to exercise independently          Exercise Goals Re-Evaluation : Exercise Goals Re-Evaluation    Row Name 03/30/19 1504             Exercise Goal Re-Evaluation   Comments  Patient has been actively participating in the virtual cardiac rehab program via the [facility] app and has been progressing  well. Patient is walking at home, and uses a fitness center, when available when he travels for work. Patient is exercising at least 30-60 minutes, 5-6 days/week.       Expected Outcomes  Patient will transition to the onsite cardiac rehab program if interested.           Discharge Exercise Prescription (Final Exercise Prescription Changes): Exercise Prescription Changes - 03/29/19 1459      Response to Exercise   Blood Pressure (Admit)  102/63    Blood Pressure (Exit)  105/65    Heart Rate (Admit)  75 bpm    Heart Rate (Exercise)  110 bpm    Rating of Perceived Exertion (Exercise)  11    Symptoms  none    Comments  Virtual cardiac rehab session    Duration  Continue with 45 min of aerobic exercise without signs/symptoms of physical distress.    Intensity  THRR unchanged      Progression   Progression  Continue to progress workloads to maintain intensity without signs/symptoms of physical distress.      Interval Training   Interval Training  No      Bike   Minutes  50      Track   Minutes  --      Home Exercise Plan   Plans to continue exercise at  Home (comment)   Walking   Initial Home Exercises Provided  03/10/19       Nutrition:  Target Goals: Understanding of nutrition guidelines, daily intake of sodium 1500mg , cholesterol 200mg , calories 30% from fat and 7% or less from saturated fats, daily to have 5 or more servings of fruits and vegetables.  Biometrics: Pre Biometrics - 03/10/19 1012      Pre Biometrics   Waist Circumference  38 inches    Hip Circumference  38.5 inches    Waist to Hip Ratio  0.99 %    Triceps Skinfold  13 mm    % Body Fat  24.3 %    Grip Strength  32.5 kg    Flexibility  13.75 in    Single Leg Stand  30 seconds        Nutrition Therapy Plan and Nutrition Goals:   Nutrition Assessments: Nutrition Assessments - 04/01/19 0934      MEDFICTS Scores   Pre Score  41       Nutrition Goals Re-Evaluation:   Nutrition Goals  Discharge (Final Nutrition Goals Re-Evaluation):  Psychosocial: Target Goals: Acknowledge presence or absence of significant depression and/or stress, maximize coping skills, provide positive support system. Participant is able to verbalize types and ability to use techniques and skills needed for reducing stress and depression.  Initial Review & Psychosocial Screening: Initial Psych Review & Screening - 03/10/19 1140      Initial Review   Current issues with  Current Anxiety/Panic      Family Dynamics   Good Support System?  Yes    Comments  Austin [name] feels confident in his recovery and has a positive attitude and outlook. He admits to mild anxiety related to his possible need for an ICD if his EF does not recover. He works out of town and eager to participate in the [facility] [place]. He is married and has multiple step-children that provide emotional support. He enjoys playing guitar and uses this as a stress outlet. Although he does have mild anxiety, this does not present a barrier to self care/health management. No interventions needed at this time.      Barriers   Psychosocial barriers to participate in program  There are no identifiable barriers or psychosocial needs.      Screening Interventions   Interventions  Encouraged to exercise       Quality of Life Scores: Quality of Life - 03/10/19 1323      Quality of Life   Select  Quality of Life      Quality of Life Scores   Health/Function Pre  26.8 %    Socioeconomic Pre  29.64 %    Psych/Spiritual Pre  28.29 %    Family Pre  30 %    GLOBAL Pre  28.16 %      Scores of 19 and below usually indicate a poorer quality of life in these areas.  A difference of  2-3 points is a clinically meaningful difference.  A difference of 2-3 points in the total score of the Quality of Life Index has been associated with significant improvement in overall quality of life, self-image, physical symptoms, and general health in  studies assessing change in quality of life.  PHQ-9: Recent Review Flowsheet Data    Depression screen [facility] 2/9 03/10/2019 12/19/2018   Decreased Interest 0 0   Down, Depressed, Hopeless 0 0   PHQ - 2 Score 0 0     Interpretation of Total Score  Total Score Depression Severity:  1-4 = Minimal depression, 5-9 = Mild depression, 10-14 = Moderate depression, 15-19 = Moderately severe depression, 20-27 = Severe depression   Psychosocial Evaluation and Intervention:   Psychosocial Re-Evaluation: Psychosocial Re-Evaluation    [place] Name 03/30/19 1256             Psychosocial Re-Evaluation   Current issues with  Current Anxiety/Panic       Interventions  Encouraged to attend Cardiac Rehabilitation for the exercise       Continue Psychosocial Services   No Follow up required          Psychosocial Discharge (Final Psychosocial Re-Evaluation): Psychosocial Re-Evaluation - 03/30/19 1256      Psychosocial Re-Evaluation   Current issues with  Current Anxiety/Panic    Interventions  Encouraged to attend Cardiac Rehabilitation for the exercise    Continue Psychosocial Services   No Follow up required       Vocational Rehabilitation: Provide vocational rehab assistance to qualifying candidates.   Vocational Rehab Evaluation & Intervention:   Education: Education Goals: Education classes  will be provided on a weekly basis, covering required topics. Participant will state understanding/return demonstration of topics presented.  Learning Barriers/Preferences:   Education Topics: Hypertension, Hypertension Reduction -Define heart disease and high blood pressure. Discus how high blood pressure affects the body and ways to reduce high blood pressure.   Exercise and Your Heart -Discuss why it is important to exercise, the FITT principles of exercise, normal and abnormal responses to exercise, and how to exercise safely.   Angina -Discuss definition of angina, causes of angina,  treatment of angina, and how to decrease risk of having angina.   Cardiac Medications -Review what the following cardiac medications are used for, how they affect the body, and side effects that may occur when taking the medications.  Medications include Aspirin, Beta blockers, calcium channel blockers, ACE Inhibitors, angiotensin receptor blockers, diuretics, digoxin, and antihyperlipidemics.   Congestive Heart Failure -Discuss the definition of CHF, how to live with CHF, the signs and symptoms of CHF, and how keep track of weight and sodium intake.   Heart Disease and Intimacy -Discus the effect sexual activity has on the heart, how changes occur during intimacy as we age, and safety during sexual activity.   Smoking Cessation / COPD -Discuss different methods to quit smoking, the health benefits of quitting smoking, and the definition of COPD.   Nutrition I: Fats -Discuss the types of cholesterol, what cholesterol does to the heart, and how cholesterol levels can be controlled.   Nutrition II: Labels -Discuss the different components of food labels and how to read food label   Heart Parts/Heart Disease and PAD -Discuss the anatomy of the heart, the pathway of blood circulation through the heart, and these are affected by heart disease.   Stress I: Signs and Symptoms -Discuss the causes of stress, how stress may lead to anxiety and depression, and ways to limit stress.   Stress II: Relaxation -Discuss different types of relaxation techniques to limit stress.   Warning Signs of Stroke / TIA -Discuss definition of a stroke, what the signs and symptoms are of a stroke, and how to identify when someone is having stroke.   Knowledge Questionnaire Score: Knowledge Questionnaire Score - 03/10/19 1323      Knowledge Questionnaire Score   Pre Score  22/24       Core Components/Risk Factors/Patient Goals at Admission: Personal Goals and Risk Factors at Admission - 03/30/19  1256      Core Components/Risk Factors/Patient Goals on Admission    Weight Management  Weight Maintenance;Yes    Intervention  Weight Management: Provide education and appropriate resources to help participant work on and attain dietary goals.    Heart Failure  Yes    Intervention  Provide a combined exercise and nutrition program that is supplemented with education, support and counseling about heart failure. Directed toward relieving symptoms such as shortness of breath, decreased exercise tolerance, and extremity edema.    Expected Outcomes  Improve functional capacity of life;Short term: Attendance in program 2-3 days a week with increased exercise capacity. Reported lower sodium intake. Reported increased fruit and vegetable intake. Reports medication compliance.;Short term: Daily weights obtained and reported for increase. Utilizing diuretic protocols set by physician.;Long term: Adoption of self-care skills and reduction of barriers for early signs and symptoms recognition and intervention leading to self-care maintenance.    Stress  Yes    Intervention  Offer individual and/or small group education and counseling on adjustment to heart disease, stress management and health-related lifestyle  change. Teach and support self-help strategies.    Expected Outcomes  Short Term: Participant demonstrates changes in health-related behavior, relaxation and other stress management skills, ability to obtain effective social support, and compliance with psychotropic medications if prescribed.;Long Term: Emotional wellbeing is indicated by absence of clinically significant psychosocial distress or social isolation.       Core Components/Risk Factors/Patient Goals Review:  Goals and Risk Factor Review    Row Name 03/30/19 1259             Core Components/Risk Factors/Patient Goals Review   Personal Goals Review  Weight Management/Obesity;Heart Failure;Stress       Review  [name] has been exercisng and  documenting his vital signs on the virtual cardiac rehab [facility] APP. Bill's vital signs have been stable. [name] travels a lot and tries to exercise at the hotel gym or in his room       Expected Outcomes  Patient will continue to partcipate in virtual cardiac rehab for exercise, nutrition and lifestyle modifications.          Core Components/Risk Factors/Patient Goals at Discharge (Final Review):  Goals and Risk Factor Review - 03/30/19 1259      Core Components/Risk Factors/Patient Goals Review   Personal Goals Review  Weight Management/Obesity;Heart Failure;Stress    Review  [name] has been exercisng and documenting his vital signs on the virtual cardiac rehab [facility] APP. Bill's vital signs have been stable. [name] travels a lot and tries to exercise at the hotel gym or in his room    Expected Outcomes  Patient will continue to partcipate in virtual cardiac rehab for exercise, nutrition and lifestyle modifications.       ITP Comments: ITP Comments    Row Name 03/10/19 1011 03/10/19 1342 03/30/19 1254       ITP Comments  Dr. Armanda Magic, Medical Director Cardiac Rehab [facility]  --  30 Day ITP Review. [name] is doing well with exercise and using the virtual [facility] APP without difficulty        Comments: See ITP comments.Gladstone Lighter, RN,BSN 04/01/2019 2:15 PM

## 2019-03-30 NOTE — Telephone Encounter (Signed)
Advanced Heart Failure Patient Advocate Encounter  Prior Authorization for Marcelline Deist has been approved.    PA# [id number] Effective dates: 03/30/2019;Coverage End Date:02/18/2098  Karle Plumber, PharmD, BCPS, BCCP, CPP Heart Failure Clinic Pharmacist [phone]

## 2019-03-30 NOTE — Progress Notes (Addendum)
PCP: Wynn Banker, MD Cardiology: Dr. Eden Emms HF Cardiology: Dr. Shirlee Latch  HPI:  Austin [name] is a 54 yo M with PMH significant for chronic systolic HF since 4/20 where he presented to hospital with SOB and orthopnea. He was found to have EF 20% and was initiated on low doses of carvedilol, Entresto, and spironolactone.  Cardiac MRI in 10/20 showed EF 24% with moderately decreased RV systolic function and full thickness LGE in basal-mid inferior wall concerning for prior MI. He then had R/LHC in 12/20 showing normal filling pressures and cardiac output. He had PCI with DES to mid LCx/OM2 and distal RCA.  He was last seen by Dr. Shirlee Latch on 03/13/19 for mixed ischemic/nonischemic cardiomyopathy follow up. His carvedilol was increased to 25 mg BID. He was continued on his Entresto 97/103 and spironolactone 25 daily. He will be following up for repeat ECHO in March to determine the need for ICD.   Pt presents to HF PharmD clinic for follow up and medication optimization. He reports feeling extremely well. He has been exercising almost every day of the week for 30-45 mins per day on the treadmill, exercise bike, or walking. He denies any lightheadedness, dizziness, and fatigue. He is able to complete all ADLs. He denies chest pain or palpitations. He is still experiencing some shortness of breath with his ticagrelor. He reports that this side effect went away initially, but came back after he missed about 3 days due to a delay in transferring his prescription refills. He says he has only had 2 episodes of shortness of breath - one when he experienced a stressful event at work, and the second when he was helping his son move furniture and thinks he lifted more than he should have. He weighs himself daily at home (dry weight = 178 lbs). He does not take furosemide or torsemide but is on spironolactone 25 mg daily. He denies PND/orthopnea/edema. Appetite is normal.   Shortness of breath/dyspnea on exertion?  yes - minimal  Orthopnea/PND? no  Edema? no  Lightheadedness/dizziness? no  Daily weights at home? Yes - 178 lbs  Blood pressure/heart rate monitoring at home? yes  Following low-sodium/fluid-restricted diet? Yes  HF Medications:  Entresto 97/103 mg BID  Carvedilol 25 mg BID  Sprionolactone 25 mg daily   Has the patient been experiencing any side effects to the medications prescribed? Yes - 2 episodes of SOB with ticagrelor. Would consider changing to prasugrel if recurs.    Does the patient have any problems obtaining medications due to transportation or finances? no   Understanding of regimen: excellent  Understanding of indications: excellent  Potential of compliance: excellent  Patient understands to avoid NSAIDs.  Patient understands to avoid decongestants.   Pertinent Lab Values (03/13/19):  Serum creatinine 0.61, BUN 17, Potassium 4, Sodium 140   Vital Signs:  Weight: 184 lbs (last clinic weight: 184 lbs; dry weight: 178 lbs - no shoes/clothes)  Blood pressure: 144/78 (BP at home 110-120/60-70s)  Heart rate: 76 (60-70s at home)  Assessment:  1. Chronic systolic CHF (EF 33%), due to mixed ischemic/nonischemic cardiomyopathy.  -NYHA class II symptoms, euvolemic on exam.  - Continue Entresto 97/103 mg BID  - Continue carvedilol 25 mg BID  - Continue spironolactone 25 mg daily  - Start Farxiga 10 mg daily - repeat BMET in 4 weeks (next visit). - Repeat ECHO next month to decide on need for ICD. Narrow QRS so not CRT candidate.  - Basic disease state pathophysiology, medication indication, mechanism  and side effects reviewed at length with patient and he verbalized understanding  2. CAD: 12/20 PCI to mid LCx/OM2 and distal RCA.  - Continue aspirin 81 mg daily  - Continue ticagrelor 90 mg BID  - Could switching ticagrelor to prasugrel if SOB persists or worsens - pt educated about P2Y12i alternatives and will reach out if he is wanting to switch agents due to SOB  -  Continue atorvastatin 80 mg daily  - Check lipids and LFTs during next visit   Plan:  1) Medication changes: Based on clinical presentation, vital signs and recent labs will start Farxiga 10 mg daily.  Patient provided with copay card. 2) Labs: BMET in 1 month  3) Follow-up: 05/11/19 with Dr. Johny Shock, PharmD, BCPS  Clinical Pharmacist  Phone: [phone]  03/30/2019 1:58 PM  Austin [name], PharmD, BCPS, BCCP, CPP Heart Failure Clinic Pharmacist [phone]

## 2019-03-30 NOTE — Patient Instructions (Addendum)
It was a pleasure seeing you today!  MEDICATIONS: -We are changing your medications today -Start Farxiga 1 tablet (10 mg) once daily -Continue Entresto 1 tablet (97/103 mg) twice daily -Continue carvedilol 1 tablet (25 mg) twice daily -Continue spironolactone 1 tablet (25 mg) once daily -Call if you have questions about your medications.  NEXT APPOINTMENT: Return to clinic in 1 month with Dr. Shirlee Latch  In general, to take care of your heart failure: -Limit your fluid intake to 2 Liters (half-gallon) per day.   -Limit your salt intake to ideally 2-3 grams (2000-3000 mg) per day. -Weigh yourself daily and record, and bring that "weight diary" to your next appointment.  (Weight gain of 2-3 pounds in 1 day typically means fluid weight.) -The medications for your heart are to help your heart and help you live longer.   -Please contact us before stopping any of your heart medications.  Call the clinic at [phone] with questions or to reschedule future appointments.

## 2019-03-30 NOTE — Addendum Note (Signed)
Encounter addended by: [name] on: 03/30/2019 3:06 PM  Actions taken: Flowsheet data copied forward, Flowsheet accepted

## 2019-03-31 ENCOUNTER — Telehealth (HOSPITAL_COMMUNITY): Payer: Self-pay

## 2019-03-31 NOTE — Telephone Encounter (Signed)
Pt insurance is active and benefits verified through [employer] Co-pay 0, DED $1,000/$272.92 met, out of pocket $4,500/$622.92 met, co-insurance 20%. no pre-authorization required. Passport, 03/31/2019@9:19am, REF# [id number]  Will contact patient to see if he is interested in the Cardiac Rehab Program. If interested, patient will need to complete follow up appt. Once completed, patient will be contacted for scheduling upon review by the RN Navigator. 

## 2019-04-01 ENCOUNTER — Encounter: Payer: Self-pay | Admitting: Family Medicine

## 2019-04-01 MED ORDER — CLONAZEPAM 0.5 MG PO TABS: 0.2500 mg | ORAL_TABLET | Freq: Two times a day (BID) | ORAL | 1 refills | Status: DC | PRN

## 2019-04-02 ENCOUNTER — Encounter (HOSPITAL_COMMUNITY): Payer: Self-pay

## 2019-04-02 NOTE — Telephone Encounter (Signed)
Spoke with the pt and scheduled a virtual visit for 3/19 at 9am.

## 2019-04-02 NOTE — Progress Notes (Signed)
Phone call to Pt to inquire about in person CR program. Pt stated he travels a lot for work and thinks that virtual CR would be a better fit for his schedule. Pt was encouraged to continue using the app for his CR program.

## 2019-04-02 NOTE — Telephone Encounter (Signed)
Please set up a follow-up virtual visit for him in 1 to 2 months.  This can be done at a time that is convenient for him.

## 2019-04-06 ENCOUNTER — Encounter (HOSPITAL_COMMUNITY)
Admission: RE | Admit: 2019-04-06 | Discharge: 2019-04-06 | Disposition: A | Discharge status: Home / Self Care | Payer: Managed Care, Other (non HMO) | Source: Ambulatory Visit | Attending: Cardiovascular Disease | Admitting: Cardiovascular Disease

## 2019-04-06 DIAGNOSIS — Z955 Presence of coronary angioplasty implant and graft: Secondary | ICD-10-CM | POA: Insufficient documentation

## 2019-04-06 NOTE — Progress Notes (Signed)
Cardiac Rehab: Virtual Visit  Called patient to check progress with exercise. Patient is exercising 6-7 days/week at least 30-50 minutes without any issues. Patient walks, rides exercise bike, treadmill or other equipment at fitness center when travelling for work. Patient is very consistent with his exercise routine and heart rate and blood pressure are well within normal limits. Patient is still having some shortness of breath from his blood thinner, which quickly resolves with rest, otherwise patient is doing well and feels like he's getting stronger. Patient will stay in the virtual cardiac rehab program versus the onsite program because he travels a lot for work. Will continue to follow.  [name], MS, ACSM CEP 04/06/2019 1312

## 2019-05-04 ENCOUNTER — Telehealth: Payer: Self-pay | Admitting: *Deleted

## 2019-05-04 ENCOUNTER — Encounter: Payer: Self-pay | Admitting: *Deleted

## 2019-05-04 NOTE — Telephone Encounter (Signed)
Left message for patient regarding appointment for Cardiac MRI scheduled Friday 05/29/19 at 8:00 am at [facility]---arrival time is 7:15 am 1st floor radiology.  Will mail information to patient and it is also available in My Chart.

## 2019-05-08 ENCOUNTER — Other Ambulatory Visit: Payer: Self-pay

## 2019-05-08 ENCOUNTER — Encounter: Payer: Self-pay | Admitting: Family Medicine

## 2019-05-08 ENCOUNTER — Telehealth (INDEPENDENT_AMBULATORY_CARE_PROVIDER_SITE_OTHER): Payer: Managed Care, Other (non HMO) | Admitting: Family Medicine

## 2019-05-08 DIAGNOSIS — I5022 Chronic systolic (congestive) heart failure: Secondary | ICD-10-CM | POA: Diagnosis not present

## 2019-05-08 DIAGNOSIS — Z13220 Encounter for screening for lipoid disorders: Secondary | ICD-10-CM | POA: Diagnosis not present

## 2019-05-08 DIAGNOSIS — R0602 Shortness of breath: Secondary | ICD-10-CM

## 2019-05-08 DIAGNOSIS — Z8669 Personal history of other diseases of the nervous system and sense organs: Secondary | ICD-10-CM

## 2019-05-08 DIAGNOSIS — F419 Anxiety disorder, unspecified: Secondary | ICD-10-CM | POA: Diagnosis not present

## 2019-05-08 DIAGNOSIS — Z1211 Encounter for screening for malignant neoplasm of colon: Secondary | ICD-10-CM

## 2019-05-08 NOTE — Progress Notes (Signed)
Virtual Visit via Video Note  I connected with Austin [name]  on 05/08/19 at  9:00 AM EDT by a video enabled telemedicine application and verified that I am speaking with the correct person using two identifiers.  Location patient: home Location provider:work or home office Persons participating in the virtual visit: patient, provider  I discussed the limitations of evaluation and management by telemedicine and the availability of in person appointments. The patient expressed understanding and agreed to proceed.   Austin [name] DOB: Jun 11, 1965 Encounter date: 05/08/2019  This is a 54 y.o. male who presents with No chief complaint on file.   History of present illness: At least exercising 5 days/week 30 min/day minimum. Getting stronger stamina wise. Started with walking, but on rainy days/hotel uses treadmill or bike.   Does need to go through with getting defibrillator. He is anxious about upcoming procedure.   Klonopin helps with anxiety, focus when needed - half tab in morning if needed and 1 tab in evening. Does feel like anxiety is worse with upcoming test. He has talked with a lot of people who have had similar procedures which has been reassuring. Now taking medication daily. Doesn't feel like he has issues with depressed mood. 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   No problems sleeping. Up twice nightly for restroom.   No Known Allergies No outpatient medications have been marked as taking for the 05/08/19 encounter (Appointment) with Wynn Banker, MD.    Review of Systems  Constitutional: Negative for chills, fatigue and fever.  Respiratory: Positive for shortness of breath. Negative for cough, chest tightness and wheezing.   Cardiovascular: Negative for chest pain, palpitations and leg swelling.    Objective:  There were no vitals taken for this visit.      BP Readings from Last 3 Encounters:  03/30/19 (!) 144/78  03/13/19 (!) 148/90   03/10/19 106/70   Wt Readings from Last 3 Encounters:  03/30/19 184 lb 9.6 oz (83.7 kg)  03/13/19 184 lb (83.5 kg)  03/10/19 181 lb 14.1 oz (82.5 kg)    EXAM:  GENERAL: alert, oriented, appears well and in no acute distress  HEENT: atraumatic, conjunctiva clear, no obvious abnormalities on inspection of external nose and ears  NECK: normal movements of the head and neck  LUNGS: on inspection no signs of respiratory distress, breathing rate appears normal, no obvious gross SOB, gasping or wheezing  CV: no obvious cyanosis  MS: moves all visible extremities without noticeable abnormality  PSYCH/NEURO: pleasant and cooperative, no obvious depression or anxiety, speech and thought processing grossly intact   Assessment/Plan  1. Screening for colon cancer - Cologuard  2. Chronic systolic congestive heart failure [facility]) He is working on cardiac rehab now; he has follow up next week and repeat echo in April. He does feel that he is improving with his regular exercise.  - CBC with Differential/Platelet; Future - Comprehensive metabolic panel; Future  3. Anxiety He has been using klonopin daily; I am going to have him update me in 2 weeks time. If still needing daily, we reviewed switching either to buspar or to SSRI. Reviewed both categories of medications. I would prefer buspar since he does not have depression and there are less overall side effects.  4. History of Guillain-Barre syndrome I advised him to call regarding hx of guillain-barre with flu shot (call [employer]) and see what they recommended. I have not seen guidelines specifically addressing this concern.  5. Lipid  screening He is on statin for CAD benefit. Recheck labs. - Lipid panel; Future  6. Shortness of breath Cardiology suspects is side effect of brilinta. They are aware. Stable.  - TSH; Future  Return in about 3 months (around 08/08/2019) for physical exam.     I discussed the assessment and treatment  plan with the patient. The patient was provided an opportunity to ask questions and all were answered. The patient agreed with the plan and demonstrated an understanding of the instructions.   The patient was advised to call back or seek an in-person evaluation if the symptoms worsen or if the condition fails to improve as anticipated.  I provided 30 minutes of non-face-to-face time during this encounter.   Micheline Rough, MD

## 2019-05-11 ENCOUNTER — Ambulatory Visit (HOSPITAL_COMMUNITY)
Admission: RE | Admit: 2019-05-11 | Discharge: 2019-05-11 | Disposition: A | Discharge status: Home / Self Care | Payer: Managed Care, Other (non HMO) | Source: Ambulatory Visit | Attending: Cardiology | Admitting: Cardiology

## 2019-05-11 ENCOUNTER — Encounter (HOSPITAL_COMMUNITY): Payer: Self-pay | Admitting: Cardiology

## 2019-05-11 ENCOUNTER — Ambulatory Visit (HOSPITAL_BASED_OUTPATIENT_CLINIC_OR_DEPARTMENT_OTHER)
Admission: RE | Admit: 2019-05-11 | Discharge: 2019-05-11 | Disposition: A | Discharge status: Home / Self Care | Payer: Managed Care, Other (non HMO) | Source: Ambulatory Visit | Attending: Cardiology | Admitting: Cardiology

## 2019-05-11 ENCOUNTER — Other Ambulatory Visit: Payer: Self-pay

## 2019-05-11 VITALS — BP 90/78 | HR 71 | Wt 179.0 lb

## 2019-05-11 DIAGNOSIS — Z7982 Long term (current) use of aspirin: Secondary | ICD-10-CM | POA: Diagnosis not present

## 2019-05-11 DIAGNOSIS — I5022 Chronic systolic (congestive) heart failure: Secondary | ICD-10-CM

## 2019-05-11 DIAGNOSIS — K219 Gastro-esophageal reflux disease without esophagitis: Secondary | ICD-10-CM | POA: Insufficient documentation

## 2019-05-11 DIAGNOSIS — Z7901 Long term (current) use of anticoagulants: Secondary | ICD-10-CM | POA: Insufficient documentation

## 2019-05-11 DIAGNOSIS — Z7984 Long term (current) use of oral hypoglycemic drugs: Secondary | ICD-10-CM | POA: Insufficient documentation

## 2019-05-11 DIAGNOSIS — I081 Rheumatic disorders of both mitral and tricuspid valves: Secondary | ICD-10-CM | POA: Diagnosis present

## 2019-05-11 DIAGNOSIS — Z79899 Other long term (current) drug therapy: Secondary | ICD-10-CM | POA: Diagnosis not present

## 2019-05-11 DIAGNOSIS — I2510 Atherosclerotic heart disease of native coronary artery without angina pectoris: Secondary | ICD-10-CM | POA: Diagnosis not present

## 2019-05-11 DIAGNOSIS — Z87891 Personal history of nicotine dependence: Secondary | ICD-10-CM | POA: Diagnosis not present

## 2019-05-11 DIAGNOSIS — Z8249 Family history of ischemic heart disease and other diseases of the circulatory system: Secondary | ICD-10-CM | POA: Diagnosis not present

## 2019-05-11 LAB — LIPID PANEL
Cholesterol: 111 mg/dL (ref 0–200)
HDL: 36 mg/dL — ABNORMAL LOW (ref >40)
LDL Cholesterol: 52 mg/dL (ref 0–99)
Total CHOL/HDL Ratio: 3.1 RATIO
Triglycerides: 114 mg/dL (ref <150)
VLDL: 23 mg/dL (ref 0–40)

## 2019-05-11 LAB — BASIC METABOLIC PANEL
Anion gap: 11 (ref 5–15)
BUN: 14 mg/dL (ref 6–20)
CO2: 22 mmol/L (ref 22–32)
Calcium: 9.1 mg/dL (ref 8.9–10.3)
Chloride: 108 mmol/L (ref 98–111)
Creatinine, Ser: 0.59 mg/dL — ABNORMAL LOW (ref 0.61–1.24)
GFR calc Af Amer: >60 mL/min (ref >60)
GFR calc non Af Amer: >60 mL/min (ref >60)
Glucose, Bld: 96 mg/dL (ref 70–99)
Potassium: 4.1 mmol/L (ref 3.5–5.1)
Sodium: 141 mmol/L (ref 135–145)

## 2019-05-11 NOTE — Progress Notes (Signed)
  Echocardiogram 2D Echocardiogram has been performed.  Austin [name] 05/11/2019, 9:52 AM

## 2019-05-11 NOTE — Progress Notes (Signed)
PCP: Wynn Banker, MD Cardiology: Dr. Eden Emms HF Cardiology: Dr. Shirlee Latch  54 y.o. with history of chronic systolic CHF and CAD returns for followup of CHF.  Patient was in his usual state of health until around 4/20.  He was admitted at that time with acute CHF, had been short of breath and orthopneic at home.  No chest pain.  At admission, he was found to be volume overloaded with echo showing EF 20%. He was started on cardiac meds and discharged.  Cardiac MRI in 10/20 showed EF 24%, moderately decreased RV systolic function, and full thickness LGE in the basal-mid inferior wall concerning for prior MI. He then had right and left heart cath in 12/20. This showed normal filling pressures and cardiac output.  He had DES to mid LCx/OM2 and distal RCA.    Echo was done today and reviewed, EF remains low at 20-25% with diffuse hypokinesis.  Mildly decreased RV systolic function.   He has been doing well symptomatically.  He exercises on a bike or treadmill, sometimes walks outside.  No significant exertional dyspnea.  No orthopnea/PND.  No chest pain. Weight is down 5 lbs.  BP low today but no lightheadedness.   Labs (11/20): K 4.4, creatinine 0.68, hgb 13.5 Labs (1/21): K 3.9, creatinine 0.77 => 0.61  ECG (personally reviewed): NSR normal  PMH: 1. H/o Guillain Barre syndrome with recovery in 1984.  2. GERD 3. Nephrolithiasis 4. PUD 5. 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  Echo (4/20) with EF 20%, moderate to severe LV dilation, moderately decreased RV systolic function, mild-moderate MR.  - Cardiac MRI (10/20): EF 24%, severe LV dilation, diffuse hypokinesis, full thickness scar basal-mid inferolateral wall suggestive of prior MI.  - LHC/RHC (12/20): 90% mLCx/OM2 stenosis treated with DES, 80% distal RCA stenosis treated with DES.  Mean RA 1, mean PA 14, mean PCWP 9, CI 2.63.  6. Prior smoker.  7. CAD: LHC (12/20) with 90% mLCx/OM2 stenosis treated with DES, 80%  distal RCA stenosis treated with DES.   FH: Mother with CHF developed in her 50s, father with PCI, brother with PCI.   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  No ETOH. Married, lives in [place].  Plays in a classic rock band.   ROS: All systems reviewed and negative except as per HPI.   Current Outpatient Medications  Medication Sig Dispense Refill  . acetaminophen (TYLENOL) 500 MG tablet Take 1,000 mg 2 (two) times daily as needed by mouth for moderate pain.    Marland Kitchen aspirin EC 81 MG tablet Take 1 tablet (81 mg total) by mouth daily. 30 tablet 11  . atorvastatin (LIPITOR) 80 MG tablet Take 1 tablet (80 mg total) by mouth daily. 30 tablet 11  . carvedilol (COREG) 25 MG tablet Take 1 tablet (25 mg total) by mouth 2 (two) times daily with a meal. 60 tablet 5  . clonazePAM (KLONOPIN) 0.5 MG tablet Take 0.5-1 tablets (0.25-0.5 mg total) by mouth 2 (two) times daily as needed for anxiety. 60 tablet 1  . dapagliflozin propanediol (FARXIGA) 10 MG TABS tablet Take 10 mg by mouth daily before breakfast. 30 tablet 11  . MELATONIN PO Take 1 tablet by mouth at bedtime as needed (sleep).    Marland Kitchen omeprazole (PRILOSEC OTC) 20 MG tablet Take 20 mg by mouth daily.    . sacubitril-valsartan (ENTRESTO) 97-103 MG Take 1 tablet by mouth 2 (two) times daily. 60 tablet 11  . spironolactone (ALDACTONE)  25 MG tablet Take 1 tablet (25 mg total) by mouth daily. 90 tablet 3  . ticagrelor (BRILINTA) 90 MG TABS tablet Take 1 tablet (90 mg total) by mouth 2 (two) times daily. 60 tablet 6   No current facility-administered medications for this encounter.   BP 90/78   Pulse 71   Wt 81.2 kg (179 lb)   SpO2 95%   BMI 24.79 kg/m  General: NAD Neck: No JVD, no thyromegaly or thyroid nodule.  Lungs: Clear to auscultation bilaterally with normal respiratory effort. CV: Nondisplaced PMI.  Heart regular S1/S2, no S3/S4, no murmur.  No peripheral edema.  No carotid bruit.  Normal pedal pulses.   Abdomen: Soft, nontender, no hepatosplenomegaly, no distention.  Skin: Intact without lesions or rashes.  Neurologic: Alert and oriented x 3.  Psych: Normal affect. Extremities: No clubbing or cyanosis.  HEENT: Normal.   Assessment/Plan: 1. Chronic systolic CHF: Suspect mixed ischemic/nonischemic cardiomyopathy.  I do not think that his CAD can explain the extent of the cardiomyopathy. Possible co-existing viral myocarditis though signs of this were not seen on MRI (cannot rule out).  No heavy ETOH or drug use.  Mother with CHF at age 9, uncertain of diagnosis => familial cardiomyopathy is a consideration.  Echo done today shows that EF remains 20-25%.  NYHA class II symptoms.  Not volume overloaded on exam. BP is soft today but no lightheadedness.  - Continue Coreg 25 mg bid.  - Continue Entresto 97/103 bid.  - Continue spironolactone 25 mg daily.  BMET today.  - Continue dapagliflozin.  - EF remains low, I will refer to EP for ICD.  Narrow QRS so not CRT candidate.  2. CAD: 12/20 PCI to mid LCx/OM2 and distal RCA.  As above, I do not think CAD alone can account for his cardiomyopathy (though suspect it plays a role).   - Continue ASA 81 daily.  - Continue statin, check lipids.   Followup in 3 months.   Loralie Champagne 05/11/2019

## 2019-05-11 NOTE — Patient Instructions (Signed)
No medication changes today!  Labs today We will only contact you if something comes back abnormal or we need to make some changes. Otherwise no news is good news!  Your physician recommends that you schedule a follow-up appointment in: 3 months with Dr Shirlee Latch.  Garage code:  June: [id number]  Please call office at [phone] option 2 if you have any questions or concerns.   At the [facility], you and your health needs are our priority. As part of our continuing mission to provide you with exceptional heart care, we have created designated Provider Care Teams. These Care Teams include your primary Cardiologist (physician) and Advanced Practice Providers (APPs- Physician Assistants and Nurse Practitioners) who all work together to provide you with the care you need, when you need it.   You may see any of the following providers on your designated Care Team at your next follow up: Marland Kitchen Dr Arvilla Meres . Dr Marca Ancona . Tonye Becket, NP . Robbie Lis, PA . Karle Plumber, PharmD   Please be sure to bring in all your medications bottles to every appointment.

## 2019-05-12 ENCOUNTER — Telehealth: Payer: Self-pay | Admitting: *Deleted

## 2019-05-12 NOTE — Telephone Encounter (Signed)
Spoke with the pt and scheduled appts as below.  

## 2019-05-12 NOTE — Telephone Encounter (Signed)
-----   Message from Wynn Banker, MD sent at 05/08/2019  9:24 AM EDT ----- Labs in 3 mo; followed by physical

## 2019-05-26 ENCOUNTER — Encounter: Payer: Self-pay | Admitting: Family Medicine

## 2019-05-28 ENCOUNTER — Encounter (HOSPITAL_COMMUNITY): Payer: Self-pay

## 2019-05-28 ENCOUNTER — Telehealth (HOSPITAL_COMMUNITY): Payer: Self-pay | Admitting: Emergency Medicine

## 2019-05-28 ENCOUNTER — Encounter: Payer: Self-pay | Admitting: Family Medicine

## 2019-05-28 NOTE — Telephone Encounter (Signed)
Attempted to call patient regarding upcoming cardiac CT appointment. °Left message on voicemail with name and callback number °Nabila Albarracin RN Navigator Cardiac Imaging °[facility] °[phone] Office °[phone] Cell ° °

## 2019-05-28 NOTE — Telephone Encounter (Signed)
Pt returning phone call regarding upcoming cardiac imaging study; pt verbalizes understanding of appt date/time, parking situation and where to check in, and verified current allergies; name and call back number provided for further questions should they arise Rockwell Alexandria RN Navigator Cardiac Imaging [facility] [phone] office [phone] cell  Pt has stents in heart from 01/2019; no other implants; states will take PO klonopin prior to appt if needed. (has had cardiac MR in past so knows what to expect) [place]

## 2019-05-29 ENCOUNTER — Ambulatory Visit (HOSPITAL_COMMUNITY)
Admission: RE | Admit: 2019-05-29 | Discharge: 2019-05-29 | Disposition: A | Discharge status: Home / Self Care | Payer: Managed Care, Other (non HMO) | Source: Ambulatory Visit | Attending: Cardiovascular Disease | Admitting: Cardiovascular Disease

## 2019-05-29 ENCOUNTER — Other Ambulatory Visit: Payer: Self-pay

## 2019-05-29 DIAGNOSIS — R931 Abnormal findings on diagnostic imaging of heart and coronary circulation: Secondary | ICD-10-CM | POA: Diagnosis present

## 2019-05-29 MED ORDER — GADOBUTROL 1 MMOL/ML IV SOLN
10.0000 mL | Freq: Once | INTRAVENOUS | Status: AC | PRN
  Administered 2019-05-29: 10 mL via INTRAVENOUS

## 2019-05-30 ENCOUNTER — Other Ambulatory Visit: Payer: Self-pay | Admitting: Family Medicine

## 2019-05-30 MED ORDER — BUSPIRONE HCL 15 MG PO TABS: ORAL_TABLET | ORAL | 2 refills | Status: DC

## 2019-06-01 ENCOUNTER — Other Ambulatory Visit: Payer: Self-pay

## 2019-06-01 ENCOUNTER — Ambulatory Visit: Payer: Managed Care, Other (non HMO) | Admitting: Internal Medicine

## 2019-06-01 ENCOUNTER — Encounter: Payer: Self-pay | Admitting: Internal Medicine

## 2019-06-01 VITALS — BP 138/88 | HR 64 | Ht 71.0 in | Wt 172.0 lb

## 2019-06-01 DIAGNOSIS — Z01812 Encounter for preprocedural laboratory examination: Secondary | ICD-10-CM

## 2019-06-01 DIAGNOSIS — I5022 Chronic systolic (congestive) heart failure: Secondary | ICD-10-CM | POA: Diagnosis not present

## 2019-06-01 DIAGNOSIS — I255 Ischemic cardiomyopathy: Secondary | ICD-10-CM

## 2019-06-01 NOTE — Patient Instructions (Addendum)
Medication Instructions:  Your physician recommends that you continue on your current medications as directed. Please refer to the Current Medication list given to you today.  *If you need a refill on your cardiac medications before your next appointment, please call your pharmacy*   Lab Work:   CBC, BMET and Covid testing to be scheduled   If you have labs (blood work) drawn today and your tests are completely normal, you will receive your results only by: Marland Kitchen MyChart Message (if you have MyChart) OR . A paper copy in the mail If you have any lab test that is abnormal or we need to change your treatment, we will call you to review the results.   Testing/Procedures: Monday, 07/06/2019 arrive at 530am at [facility] - Instruction letter to follow.  Your physician has recommended that you have a defibrillator inserted. An implantable cardioverter defibrillator (ICD) is a small device that is placed in your chest or, in rare cases, your abdomen. This device uses electrical pulses or shocks to help control life-threatening, irregular heartbeats that could lead the heart to suddenly stop beating (sudden cardiac arrest). Leads are attached to the ICD that goes into your heart. This is done in the hospital and usually requires an overnight stay. Please see the instruction sheet given to you today for more information.   Follow-Up: At [facility], you and your health needs are our priority.  As part of our continuing mission to provide you with exceptional heart care, we have created designated Provider Care Teams.  These Care Teams include your primary Cardiologist (physician) and Advanced Practice Providers (APPs -  Physician Assistants and Nurse Practitioners) who all work together to provide you with the care you need, when you need it.  We recommend signing up for the patient portal called "MyChart".  Sign up information is provided on this After Visit Summary.  MyChart is used to  connect with patients for Virtual Visits (Telemedicine).  Patients are able to view lab/test results, encounter notes, upcoming appointments, etc.  Non-urgent messages can be sent to your provider as well.   To learn more about what you can do with MyChart, go to [email or web address].    Your next appointment:    Dr Odessa Fleming scheduler will call you to schedule your follow up appointments

## 2019-06-01 NOTE — Progress Notes (Signed)
Patient Care Team: Wynn Banker, MD as PCP - General (Family Medicine) Wendall Stade, MD as PCP - Cardiology (Cardiology)   HPI  Austin [name] is a 54 y.o. male seen in followup for consideration of an ICD   Having presented with symptoms of congestive heart failure 4/20,  Evaluation ended up demonstrating left ventricular dysfunction.  Medical therapy was initiated.  Symptoms largely abated.   Further evaluation fall/2020 demonstrated a full-thickness scar by MRI scanning CTA was concerning for occlusive coronary disease and he underwent catheterization and subsequent stenting 12/20.  He has had some breathlessness which is gradually resolving following his stenting and the use of Brilinta.    FHx + CAD.  DATE TEST EF   4/20 Echo   20 % INF AK   10/20 cMRI   Full thickness Infer Scar  11/20 CTA  24 %   12/20 LHC  LADp20; OM2 90>>stent ;RCAd 80>Stent  3/21 Echo  20-25%   4/21 cMRI  27% 2/3 thickness scar     Date Cr K Hgb  3/21 0.59 4.1 15.0 (12/20)      Records and Results Reviewed   Past Medical History:  Diagnosis Date  . Anxiety   . Gastric ulcer   . GERD (gastroesophageal reflux disease)   . Guillain Barr syndrome (HCC) 1984   COMPLETE RECOVERY; triggered from flu illness  . Headache(784.0)    hx of tension-none recent  . Hematemesis 09/07/2016  . History of kidney stones 09/19/12   left ureteral stone-SURGERY 8/6 URETEROSCOPY / STENT PLACEMENT   . Partial gastric outlet obstruction   . UGIB (upper gastrointestinal bleed) 09/07/2016    Past Surgical History:  Procedure Laterality Date  . BALLOON DILATION N/A 11/15/2016   Procedure: BALLOON DILATION;  Surgeon: Benancio Deeds, MD;  Location: [facility];  Service: Gastroenterology;  Laterality: N/A;  . CORONARY STENT INTERVENTION N/A 01/21/2019   Procedure: CORONARY STENT INTERVENTION;  Surgeon: Kathleene Hazel, MD;  Location: [facility];  Service: Cardiovascular;   Laterality: N/A;  . CYSTO, LEFT RGP, DIAGNOSTIC URETEROSCOPY AND STENT PLACEMENT  09/24/2012  . CYSTOSCOPY WITH RETROGRADE PYELOGRAM, URETEROSCOPY AND STENT PLACEMENT Left 09/24/2012   Procedure: CYSTOSCOPY WITH RETROGRADE PYELOGRAM, DIAGNOSTIC URETEROSCOPY AND STENT PLACEMENT;  Surgeon: Sebastian Ache, MD;  Location: [facility];  Service: Urology;  Laterality: Left;  . CYSTOSCOPY WITH RETROGRADE PYELOGRAM, URETEROSCOPY AND STENT PLACEMENT Left 10/08/2012   Procedure: CYSTOSCOPY WITH RETROGRADE PYELOGRAM, URETEROSCOPY AND STENT PLACEMENT;  Surgeon: Sebastian Ache, MD;  Location: [facility];  Service: Urology;  Laterality: Left;  1 HR NEEDS DIGITAL URETEROSCOPE   . ESOPHAGOGASTRODUODENOSCOPY N/A 09/08/2016   Procedure: ESOPHAGOGASTRODUODENOSCOPY (EGD);  Surgeon: Ruffin Frederick, MD;  Location: [facility];  Service: Gastroenterology;  Laterality: N/A;  . ESOPHAGOGASTRODUODENOSCOPY N/A 11/15/2016   Procedure: ESOPHAGOGASTRODUODENOSCOPY (EGD);  Surgeon: Benancio Deeds, MD;  Location: [facility];  Service: Gastroenterology;  Laterality: N/A;  . ESOPHAGOGASTRODUODENOSCOPY N/A 12/31/2016   Procedure: ESOPHAGOGASTRODUODENOSCOPY (EGD);  Surgeon: Benancio Deeds, MD;  Location: [facility];  Service: Gastroenterology;  Laterality: N/A;  . ESOPHAGOGASTRODUODENOSCOPY (EGD) WITH PROPOFOL N/A 10/15/2016   Procedure: ESOPHAGOGASTRODUODENOSCOPY (EGD) WITH PROPOFOL; Duodenal dilation;  Surgeon: Ruffin Frederick, MD;  Location: [facility];  Service: Gastroenterology;  Laterality: N/A;  . HOLMIUM LASER APPLICATION Left 10/08/2012   Procedure: HOLMIUM LASER APPLICATION;  Surgeon: Sebastian Ache, MD;  Location: [facility];  Service: Urology;  Laterality: Left;  . RIGHT/LEFT HEART CATH AND CORONARY ANGIOGRAPHY  N/A 01/21/2019   Procedure: RIGHT/LEFT HEART CATH AND CORONARY ANGIOGRAPHY;  Surgeon: Burnell Blanks, MD;  Location: [place] [facility];  Service: Cardiovascular;  Laterality: N/A;  . SAVORY  DILATION N/A 12/31/2016   Procedure: POSSIBLE SAVORY DILATION;  Surgeon: Yetta Flock, MD;  Location: [facility];  Service: Gastroenterology;  Laterality: N/A;  . TONSILLECTOMY     t&a as a child  . wisdom teeth extracted      Current Meds  Medication Sig  . acetaminophen (TYLENOL) 500 MG tablet Take 1,000 mg 2 (two) times daily as needed by mouth for moderate pain.  Marland Kitchen aspirin EC 81 MG tablet Take 1 tablet (81 mg total) by mouth daily.  Marland Kitchen atorvastatin (LIPITOR) 80 MG tablet Take 1 tablet (80 mg total) by mouth daily.  . busPIRone (BUSPAR) 15 MG tablet Start with 1/2 tablet PO BID x 7 days, then increase to 1 tablet PO BID  . carvedilol (COREG) 25 MG tablet Take 1 tablet (25 mg total) by mouth 2 (two) times daily with a meal.  . clonazePAM (KLONOPIN) 0.5 MG tablet Take 0.5-1 tablets (0.25-0.5 mg total) by mouth 2 (two) times daily as needed for anxiety.  . dapagliflozin propanediol (FARXIGA) 10 MG TABS tablet Take 10 mg by mouth daily before breakfast.  . MELATONIN PO Take 1 tablet by mouth at bedtime as needed (sleep).  Marland Kitchen omeprazole (PRILOSEC OTC) 20 MG tablet Take 20 mg by mouth daily.  . sacubitril-valsartan (ENTRESTO) 97-103 MG Take 1 tablet by mouth 2 (two) times daily.  Marland Kitchen spironolactone (ALDACTONE) 25 MG tablet Take 1 tablet (25 mg total) by mouth daily.  . ticagrelor (BRILINTA) 90 MG TABS tablet Take 1 tablet (90 mg total) by mouth 2 (two) times daily.    No Known Allergies    Review of Systems negative except from HPI and PMH  Physical Exam BP 138/88   Pulse 64   Ht 5\' 11"  (1.803 m)   Wt 172 lb (78 kg)   SpO2 98%   BMI 23.99 kg/m  Well developed and well nourished in no acute distress HENT normal E scleral and icterus clear Neck Supple JVP flat; carotids brisk and full Clear to ausculation Regular rate and rhythm, no murmurs gallops or rub Soft with active bowel sounds No clubbing cyanosis  Edema Alert and oriented, grossly normal motor and sensory  function Skin Warm and Dry  ECG sinus at 64 Interval 17/11/40  CrCl cannot be calculated (Patient's most recent lab result is older than the maximum 21 days allowed.).        Assessment and Plan:  Ischemic and Nonischemic Cardiomyopathy  CAD s/p revascularization RCA and OM  Congestive heart failure-chronic-systolic class II    Persistent left ventricular dysfunction despite optimal medical therapy.  Appropriately considered for ICD for primary prevention.   We have discussed the relative benefits and merits of transvenous versus subcutaneous ICD implantation.  The advantages of the former including battery longevity, history derived from use in randomized controlled trials and perhaps a somewhat lower rate of inappropriate ICD discharges. Advantages of the latter  include the fact that it is extravascular,  Resulting different implications of device infection and it being non-transvalvular.    He would prefer a transvenous device with his extra longevity.  Have reviewed the potential benefits and risks of ICD implantation including but not limited to death, perforation of heart or lung, lead dislodgement, infection,  device malfunction and inappropriate shocks.  The patient and family express  understanding  and are willing to proceed.          Sherryl Manges

## 2019-06-05 ENCOUNTER — Telehealth: Payer: Self-pay

## 2019-06-05 NOTE — Telephone Encounter (Signed)
Spoke with pt and reviewed implant instruction letter with him. (See Instruction Letter)  Copy of letter sent through MyChart and hard copy mailed to residence.  Pt with no questions at this time.  Pt verbalizes understanding and agrees with current plan.

## 2019-06-17 ENCOUNTER — Encounter: Payer: Self-pay | Admitting: Family Medicine

## 2019-06-18 MED ORDER — SERTRALINE HCL 50 MG PO TABS: 50.0000 mg | ORAL_TABLET | Freq: Every day | ORAL | 1 refills | Status: DC

## 2019-07-02 ENCOUNTER — Other Ambulatory Visit (HOSPITAL_COMMUNITY)
Admission: RE | Admit: 2019-07-02 | Discharge: 2019-07-02 | Disposition: A | Discharge status: Home / Self Care | Payer: Managed Care, Other (non HMO) | Source: Ambulatory Visit | Attending: Internal Medicine | Admitting: Internal Medicine

## 2019-07-02 ENCOUNTER — Other Ambulatory Visit: Payer: Managed Care, Other (non HMO) | Admitting: *Deleted

## 2019-07-02 ENCOUNTER — Other Ambulatory Visit: Payer: Self-pay

## 2019-07-02 DIAGNOSIS — I5022 Chronic systolic (congestive) heart failure: Secondary | ICD-10-CM

## 2019-07-02 DIAGNOSIS — Z01812 Encounter for preprocedural laboratory examination: Secondary | ICD-10-CM | POA: Diagnosis not present

## 2019-07-02 DIAGNOSIS — Z20822 Contact with and (suspected) exposure to covid-19: Secondary | ICD-10-CM | POA: Insufficient documentation

## 2019-07-02 DIAGNOSIS — I255 Ischemic cardiomyopathy: Secondary | ICD-10-CM

## 2019-07-02 LAB — CBC
Hematocrit: 42.7 % (ref 37.5–51.0)
Hemoglobin: 14.9 g/dL (ref 13.0–17.7)
MCH: 34.1 pg — ABNORMAL HIGH (ref 26.6–33.0)
MCHC: 34.9 g/dL (ref 31.5–35.7)
MCV: 98 fL — ABNORMAL HIGH (ref 79–97)
Platelets: 246 10*3/uL (ref 150–450)
RBC: 4.37 x10E6/uL (ref 4.14–5.80)
RDW: 12.9 % (ref 11.6–15.4)
WBC: 8.1 10*3/uL (ref 3.4–10.8)

## 2019-07-02 LAB — BASIC METABOLIC PANEL
BUN/Creatinine Ratio: 20 (ref 9–20)
BUN: 16 mg/dL (ref 6–24)
CO2: 21 mmol/L (ref 20–29)
Calcium: 9.3 mg/dL (ref 8.7–10.2)
Chloride: 104 mmol/L (ref 96–106)
Creatinine, Ser: 0.79 mg/dL (ref 0.76–1.27)
GFR calc Af Amer: 119 mL/min/{1.73_m2} (ref >59)
GFR calc non Af Amer: 103 mL/min/{1.73_m2} (ref >59)
Glucose: 110 mg/dL — ABNORMAL HIGH (ref 65–99)
Potassium: 4 mmol/L (ref 3.5–5.2)
Sodium: 141 mmol/L (ref 134–144)

## 2019-07-02 LAB — SARS CORONAVIRUS 2 (TAT 6-24 HRS): SARS Coronavirus 2: NEGATIVE

## 2019-07-03 ENCOUNTER — Encounter (HOSPITAL_COMMUNITY)
Admission: RE | Admit: 2019-07-03 | Discharge: 2019-07-03 | Disposition: A | Discharge status: Home / Self Care | Payer: Managed Care, Other (non HMO) | Source: Ambulatory Visit | Attending: Cardiology | Admitting: Cardiology

## 2019-07-03 DIAGNOSIS — Z955 Presence of coronary angioplasty implant and graft: Secondary | ICD-10-CM

## 2019-07-03 NOTE — Progress Notes (Signed)
Cardiac Rehab: Virtual Visit  Patient participates in the virtual cardiac rehab program via the [employer] app. Patient's virtual program will be complete on 07/10/19, follow-up call completed to discuss progress with exercise. Patient has been very consistent with his exercise but was a little frustrated that his ejection fraction had not improved. Patient hasn't been logging exercise recently in the app, but he is still walking although not as much as he had been because of busy work schedule and some of the frustration regarding his EF. Patient does not have any questions or concerns about his exercise program. Patient is motivated and knows what he needs to do regarding exercise. Patient is scheduled for ICD implantation on 07/06/19 and plans to resume walking routine once cleared to do so after his procedure. Patient has had some SOB but feels that could be related to Brilinta or possibly related to stress. 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 Patient has been highly motivated and consistent with his exercise throughout the program, and I encouraged him to continue following his procedure with clearance from his cardiologist. Patient encouraged to call if he has any questions in the future.  [name], MS, ACSM CEP

## 2019-07-06 ENCOUNTER — Other Ambulatory Visit: Payer: Self-pay

## 2019-07-06 ENCOUNTER — Ambulatory Visit (HOSPITAL_COMMUNITY): Payer: Managed Care, Other (non HMO)

## 2019-07-06 ENCOUNTER — Ambulatory Visit (HOSPITAL_COMMUNITY)
Admission: RE | Disposition: A | Discharge status: Home / Self Care | Payer: Managed Care, Other (non HMO) | Source: Home / Self Care | Attending: Internal Medicine

## 2019-07-06 ENCOUNTER — Other Ambulatory Visit: Payer: Self-pay | Admitting: Physician Assistant

## 2019-07-06 ENCOUNTER — Ambulatory Visit (HOSPITAL_COMMUNITY)
Admission: RE | Admit: 2019-07-06 | Discharge: 2019-07-06 | Disposition: A | Discharge status: Home / Self Care | Payer: Managed Care, Other (non HMO) | Attending: Internal Medicine | Admitting: Internal Medicine

## 2019-07-06 DIAGNOSIS — K219 Gastro-esophageal reflux disease without esophagitis: Secondary | ICD-10-CM | POA: Insufficient documentation

## 2019-07-06 DIAGNOSIS — Z79899 Other long term (current) drug therapy: Secondary | ICD-10-CM | POA: Diagnosis not present

## 2019-07-06 DIAGNOSIS — I2510 Atherosclerotic heart disease of native coronary artery without angina pectoris: Secondary | ICD-10-CM | POA: Insufficient documentation

## 2019-07-06 DIAGNOSIS — Z8249 Family history of ischemic heart disease and other diseases of the circulatory system: Secondary | ICD-10-CM | POA: Diagnosis not present

## 2019-07-06 DIAGNOSIS — Z7982 Long term (current) use of aspirin: Secondary | ICD-10-CM | POA: Insufficient documentation

## 2019-07-06 DIAGNOSIS — Z95810 Presence of automatic (implantable) cardiac defibrillator: Secondary | ICD-10-CM

## 2019-07-06 DIAGNOSIS — Z7984 Long term (current) use of oral hypoglycemic drugs: Secondary | ICD-10-CM | POA: Insufficient documentation

## 2019-07-06 DIAGNOSIS — I428 Other cardiomyopathies: Secondary | ICD-10-CM | POA: Insufficient documentation

## 2019-07-06 DIAGNOSIS — I5022 Chronic systolic (congestive) heart failure: Secondary | ICD-10-CM | POA: Insufficient documentation

## 2019-07-06 DIAGNOSIS — F419 Anxiety disorder, unspecified: Secondary | ICD-10-CM | POA: Insufficient documentation

## 2019-07-06 DIAGNOSIS — J939 Pneumothorax, unspecified: Secondary | ICD-10-CM

## 2019-07-06 DIAGNOSIS — R918 Other nonspecific abnormal finding of lung field: Secondary | ICD-10-CM

## 2019-07-06 DIAGNOSIS — Z006 Encounter for examination for normal comparison and control in clinical research program: Secondary | ICD-10-CM | POA: Diagnosis not present

## 2019-07-06 DIAGNOSIS — Z87891 Personal history of nicotine dependence: Secondary | ICD-10-CM | POA: Insufficient documentation

## 2019-07-06 DIAGNOSIS — I255 Ischemic cardiomyopathy: Secondary | ICD-10-CM

## 2019-07-06 HISTORY — PX: ICD IMPLANT: EP1208

## 2019-07-06 SURGERY — ICD IMPLANT

## 2019-07-06 MED ORDER — IOHEXOL 350 MG/ML SOLN
INTRAVENOUS | Status: DC | PRN
  Administered 2019-07-06: 35 mL

## 2019-07-06 MED ORDER — HEPARIN (PORCINE) IN NACL 1000-0.9 UT/500ML-% IV SOLN
INTRAVENOUS | Status: DC | PRN
  Administered 2019-07-06: 500 mL

## 2019-07-06 MED ORDER — MIDAZOLAM HCL 5 MG/5ML IJ SOLN
INTRAMUSCULAR | Status: AC
  Filled 2019-07-06: qty 5

## 2019-07-06 MED ORDER — OXYCODONE-ACETAMINOPHEN 5-325 MG PO TABS
ORAL_TABLET | ORAL | Status: AC
  Filled 2019-07-06: qty 2

## 2019-07-06 MED ORDER — CEFAZOLIN SODIUM-DEXTROSE 2-4 GM/100ML-% IV SOLN
2.0000 g | INTRAVENOUS | Status: AC
  Administered 2019-07-06: 2 g via INTRAVENOUS

## 2019-07-06 MED ORDER — LIDOCAINE HCL 1 % IJ SOLN
INTRAMUSCULAR | Status: AC
  Filled 2019-07-06: qty 20

## 2019-07-06 MED ORDER — MIDAZOLAM HCL 5 MG/5ML IJ SOLN
INTRAMUSCULAR | Status: DC | PRN
  Administered 2019-07-06: 2 mg via INTRAVENOUS
  Administered 2019-07-06 (×3): 1 mg via INTRAVENOUS
  Administered 2019-07-06: 2 mg via INTRAVENOUS

## 2019-07-06 MED ORDER — FENTANYL CITRATE (PF) 100 MCG/2ML IJ SOLN
INTRAMUSCULAR | Status: DC | PRN
  Administered 2019-07-06 (×2): 25 ug via INTRAVENOUS
  Administered 2019-07-06: 50 ug via INTRAVENOUS
  Administered 2019-07-06: 25 ug via INTRAVENOUS
  Administered 2019-07-06: 12.5 ug via INTRAVENOUS

## 2019-07-06 MED ORDER — LIDOCAINE HCL (PF) 1 % IJ SOLN
INTRAMUSCULAR | Status: DC | PRN
  Administered 2019-07-06: 60 mL

## 2019-07-06 MED ORDER — ACETAMINOPHEN 325 MG PO TABS
ORAL_TABLET | ORAL | Status: AC
  Filled 2019-07-06: qty 2

## 2019-07-06 MED ORDER — SODIUM CHLORIDE 0.9 % IV SOLN: INTRAVENOUS | Status: DC

## 2019-07-06 MED ORDER — ACETAMINOPHEN 325 MG PO TABS
325.0000 mg | ORAL_TABLET | ORAL | Status: DC | PRN
  Administered 2019-07-06: 650 mg via ORAL

## 2019-07-06 MED ORDER — SODIUM CHLORIDE 0.9 % IV SOLN
80.0000 mg | INTRAVENOUS | Status: AC
  Administered 2019-07-06: 80 mg
  Filled 2019-07-06: qty 2

## 2019-07-06 MED ORDER — HEPARIN (PORCINE) IN NACL 1000-0.9 UT/500ML-% IV SOLN
INTRAVENOUS | Status: AC
  Filled 2019-07-06: qty 500

## 2019-07-06 MED ORDER — OXYCODONE-ACETAMINOPHEN 5-325 MG PO TABS
2.0000 | ORAL_TABLET | Freq: Once | ORAL | Status: AC
  Administered 2019-07-06: 2 via ORAL

## 2019-07-06 MED ORDER — FENTANYL CITRATE (PF) 100 MCG/2ML IJ SOLN
INTRAMUSCULAR | Status: AC
  Filled 2019-07-06: qty 2

## 2019-07-06 MED ORDER — SODIUM CHLORIDE 0.9 % IV SOLN
INTRAVENOUS | Status: AC
  Filled 2019-07-06: qty 2

## 2019-07-06 MED ORDER — ONDANSETRON HCL 4 MG/2ML IJ SOLN: 4.0000 mg | Freq: Four times a day (QID) | INTRAMUSCULAR | Status: DC | PRN

## 2019-07-06 MED ORDER — CEFAZOLIN SODIUM-DEXTROSE 2-4 GM/100ML-% IV SOLN
INTRAVENOUS | Status: AC
  Filled 2019-07-06: qty 100

## 2019-07-06 SURGICAL SUPPLY — 9 items
CABLE SURGICAL S-101-97-12 (CABLE) ×3 IMPLANT
HEMOSTAT SURGICEL 2X4 FIBR (HEMOSTASIS) ×3 IMPLANT
ICD MOMENTUM D120 (ICD Generator) ×3 IMPLANT
KIT MICROPUNCTURE NIT STIFF (SHEATH) ×9 IMPLANT
LEAD RELIANCE 0138-64 (Lead) ×3 IMPLANT
PAD PRO RADIOLUCENT 2001M-C (PAD) ×3 IMPLANT
SHEATH 8FR PRELUDE SNAP 13 (SHEATH) IMPLANT
SHEATH 9FR PRELUDE SNAP 13 (SHEATH) ×3 IMPLANT
TRAY PACEMAKER INSERTION (PACKS) ×3 IMPLANT

## 2019-07-06 NOTE — Discharge Instructions (Signed)
    Supplemental Discharge Instructions for  Pacemaker/Defibrillator Patients  Activity No heavy lifting or vigorous activity with your left/right arm for 6 to 8 weeks.  Do not raise your left/right arm above your head for one week.  Gradually raise your affected arm as drawn below.             07/10/2019                 07/11/2019                07/12/2019              07/13/2019 __  NO DRIVING for 1 week  ; you may begin driving on 8/46/9629   .  WOUND CARE - Keep the wound area clean and dry.  Do not get this area wet for 24 hours. No showers for one week; you may shower on  07/17/19   . - Dr. Graciela Husbands used DERMABOND (skin glue) on your incision, DO NOT peel this off - No bandage is needed on the site.  DO  NOT apply any creams, oils, or ointments to the wound area. - If you notice any drainage or discharge from the wound, any swelling or bruising at the site, or you develop a fever > 101? F after you are discharged home, call the office at once.  Special Instructions - You are still able to use cellular telephones; use the ear opposite the side where you have your pacemaker/defibrillator.  Avoid carrying your cellular phone near your device. - When traveling through airports, show security personnel your identification card to avoid being screened in the metal detectors.  Ask the security personnel to use the hand wand. - Avoid arc welding equipment, MRI testing (magnetic resonance imaging), TENS units (transcutaneous nerve stimulators).  Call the office for questions about other devices. - Avoid electrical appliances that are in poor condition or are not properly grounded. - Microwave ovens are safe to be near or to operate.  Additional information for defibrillator patients should your device go off: - If your device goes off ONCE and you feel fine afterward, notify the device clinic nurses. - If your device goes off ONCE and you do not feel well afterward, call 911. - If your device goes  off TWICE, call 911. - If your device goes off THREE times in one day, call 911.  DO NOT DRIVE YOURSELF OR A FAMILY MEMBER WITH A DEFIBRILLATOR TO THE HOSPITAL--CALL 911.

## 2019-07-06 NOTE — Progress Notes (Signed)
Paged Luster Landsberg, PA to notify of client with increased pain

## 2019-07-06 NOTE — Progress Notes (Signed)
Per Dr Graciela Husbands okay to d/c home

## 2019-07-06 NOTE — H&P (Signed)
Patient Care Team: Wynn Banker, MD as PCP - General (Family Medicine) Wendall Stade, MD as PCP - Cardiology (Cardiology)   HPI  Austin [name] is a 54 y.o. male admitted for ICD for primary prevention   2020  presented with symptoms of congestive heart failure  Evaluation ended up demonstrating left ventricular dysfunction.  Medical therapy was initiated.  Symptoms largely abated.    Further evaluation fall/2020 demonstrated a full-thickness scar by MRI scanning CTA was concerning for occlusive coronary disease and he underwent catheterization and subsequent stenting 12/20.  He has had some breathlessness which is gradually resolving following his stenting and the use of Brilinta.    FHx + CAD.  DATE TEST EF   4/20 Echo   20 % INF AK   10/20 cMRI   Full thickness Infer Scar  11/20 CTA  24 %   12/20 LHC  LADp20; OM2 90>>stent ;RCAd 80>Stent  3/21 Echo  20-25%   4/21 cMRI  27% 2/3 thickness scar     Date Cr K Hgb  3/21 0.59 4.1 15.0 (12/20)  5/21 0.79 4.0 14.9       Records and Results Reviewed   Past Medical History:  Diagnosis Date  . Anxiety   . Gastric ulcer   . GERD (gastroesophageal reflux disease)   . Guillain Barr syndrome (HCC) 1984   COMPLETE RECOVERY; triggered from flu illness  . Headache(784.0)    hx of tension-none recent  . Hematemesis 09/07/2016  . History of kidney stones 09/19/12   left ureteral stone-SURGERY 8/6 URETEROSCOPY / STENT PLACEMENT   . Partial gastric outlet obstruction   . UGIB (upper gastrointestinal bleed) 09/07/2016    Past Surgical History:  Procedure Laterality Date  . BALLOON DILATION N/A 11/15/2016   Procedure: BALLOON DILATION;  Surgeon: Benancio Deeds, MD;  Location: [facility];  Service: Gastroenterology;  Laterality: N/A;  . CORONARY STENT INTERVENTION N/A 01/21/2019   Procedure: CORONARY STENT INTERVENTION;  Surgeon: Kathleene Hazel, MD;  Location: [facility];   Service: Cardiovascular;  Laterality: N/A;  . CYSTO, LEFT RGP, DIAGNOSTIC URETEROSCOPY AND STENT PLACEMENT  09/24/2012  . CYSTOSCOPY WITH RETROGRADE PYELOGRAM, URETEROSCOPY AND STENT PLACEMENT Left 09/24/2012   Procedure: CYSTOSCOPY WITH RETROGRADE PYELOGRAM, DIAGNOSTIC URETEROSCOPY AND STENT PLACEMENT;  Surgeon: Sebastian Ache, MD;  Location: [facility];  Service: Urology;  Laterality: Left;  . CYSTOSCOPY WITH RETROGRADE PYELOGRAM, URETEROSCOPY AND STENT PLACEMENT Left 10/08/2012   Procedure: CYSTOSCOPY WITH RETROGRADE PYELOGRAM, URETEROSCOPY AND STENT PLACEMENT;  Surgeon: Sebastian Ache, MD;  Location: [facility];  Service: Urology;  Laterality: Left;  1 HR NEEDS DIGITAL URETEROSCOPE   . ESOPHAGOGASTRODUODENOSCOPY N/A 09/08/2016   Procedure: ESOPHAGOGASTRODUODENOSCOPY (EGD);  Surgeon: Ruffin Frederick, MD;  Location: [facility];  Service: Gastroenterology;  Laterality: N/A;  . ESOPHAGOGASTRODUODENOSCOPY N/A 11/15/2016   Procedure: ESOPHAGOGASTRODUODENOSCOPY (EGD);  Surgeon: Benancio Deeds, MD;  Location: [facility];  Service: Gastroenterology;  Laterality: N/A;  . ESOPHAGOGASTRODUODENOSCOPY N/A 12/31/2016   Procedure: ESOPHAGOGASTRODUODENOSCOPY (EGD);  Surgeon: Benancio Deeds, MD;  Location: [facility];  Service: Gastroenterology;  Laterality: N/A;  . ESOPHAGOGASTRODUODENOSCOPY (EGD) WITH PROPOFOL N/A 10/15/2016   Procedure: ESOPHAGOGASTRODUODENOSCOPY (EGD) WITH PROPOFOL; Duodenal dilation;  Surgeon: Ruffin Frederick, MD;  Location: [facility];  Service: Gastroenterology;  Laterality: N/A;  . HOLMIUM LASER APPLICATION Left 10/08/2012   Procedure: HOLMIUM LASER APPLICATION;  Surgeon: Sebastian Ache, MD;  Location: [facility];  Service: Urology;  Laterality: Left;  . RIGHT/LEFT HEART  CATH AND CORONARY ANGIOGRAPHY N/A 01/21/2019   Procedure: RIGHT/LEFT HEART CATH AND CORONARY ANGIOGRAPHY;  Surgeon: Burnell Blanks, MD;  Location: [facility] [facility];  Service: Cardiovascular;   Laterality: N/A;  . SAVORY DILATION N/A 12/31/2016   Procedure: POSSIBLE SAVORY DILATION;  Surgeon: Yetta Flock, MD;  Location: [facility];  Service: Gastroenterology;  Laterality: N/A;  . TONSILLECTOMY     t&a as a child  . wisdom teeth extracted      Current Facility-Administered Medications  Medication Dose Route Frequency Provider Last Rate Last Admin  . 0.9 %  sodium chloride infusion   Intravenous Continuous Deboraha Sprang, MD 50 mL/hr at 07/06/19 0635 New Bag at 07/06/19 0932  . 0.9 %  sodium chloride infusion   Intravenous Continuous Deboraha Sprang, MD 50 mL/hr at 07/06/19 0635 New Bag at 07/06/19 3557  . ceFAZolin (ANCEF) IVPB 2g/100 mL premix  2 g Intravenous On Call Deboraha Sprang, MD      . gentamicin (GARAMYCIN) 80 mg in sodium chloride 0.9 % 500 mL irrigation  80 mg Irrigation On Call Deboraha Sprang, MD        No Known Allergies    Social History   Tobacco Use  . Smoking status: Former Smoker    Packs/day: 1.50    Years: 28.00    Pack years: 42.00    Types: Cigarettes    Quit date: 05/02/2018    Years since quitting: 1.1  . Smokeless tobacco: Never Used  Substance Use Topics  . Alcohol use: Yes    Comment: occasional  . Drug use: No    Types: Marijuana    Comment: in the past     Family History  Problem Relation Age of Onset  . Heart disease Mother   . Congestive Heart Failure Mother   . Ulcerative colitis Father   . Hemachromatosis Father   . Rheum arthritis Father   . Heart disease Brother 48       stenting     Current Meds  Medication Sig  . acetaminophen (TYLENOL) 500 MG tablet Take 1,000 mg 2 (two) times daily as needed by mouth for moderate pain.  Marland Kitchen aspirin EC 81 MG tablet Take 1 tablet (81 mg total) by mouth daily.  Marland Kitchen atorvastatin (LIPITOR) 80 MG tablet Take 1 tablet (80 mg total) by mouth daily.  . carvedilol (COREG) 25 MG tablet Take 1 tablet (25 mg total) by mouth 2 (two) times daily with a meal.  . dapagliflozin propanediol  (FARXIGA) 10 MG TABS tablet Take 10 mg by mouth daily before breakfast.  . MELATONIN PO Take 1 tablet by mouth at bedtime as needed (sleep).  Marland Kitchen omeprazole (PRILOSEC OTC) 20 MG tablet Take 20 mg by mouth daily as needed (acid reflux).   . sacubitril-valsartan (ENTRESTO) 97-103 MG Take 1 tablet by mouth 2 (two) times daily.  . sertraline (ZOLOFT) 50 MG tablet Take 1 tablet (50 mg total) by mouth daily.  Marland Kitchen spironolactone (ALDACTONE) 25 MG tablet Take 1 tablet (25 mg total) by mouth daily.  . ticagrelor (BRILINTA) 90 MG TABS tablet Take 1 tablet (90 mg total) by mouth 2 (two) times daily.     Review of Systems negative except from HPI and PMH  Physical Exam BP 114/78   Pulse 70   Temp 97.6 F (36.4 C) (Skin)   Resp 18   Ht 6' (1.829 m)   Wt 80.3 kg   SpO2 100%   BMI 24.01  kg/m  Well developed and well nourished in no acute distress HENT normal E scleral and icterus clear Neck Supple JVP flat; carotids brisk and full Clear to ausculation Regular rate and rhythm, no murmurs gallops or rub Soft with active bowel sounds No clubbing cyanosis Edema Alert and oriented, grossly normal motor and sensory function Skin Warm and Dry    Assessment and  Plan Ischemic and Nonischemic Cardiomyopathy  CAD s/p revascularization RCA and OM  Congestive heart failure-chronic-systolic class II   Euvolemic continue current meds  Without symptoms of ischemia   Have reviewed the potential benefits and risks of ICD implantation including but not limited to death, perforation of heart or lung, lead dislodgement, infection,  device malfunction and inappropriate shocks.  The patient  express understanding  and is willing to proceed.

## 2019-07-06 NOTE — Progress Notes (Signed)
Device check and CXR reviewed by Dr. Graciela Husbands.  Asked that I review CXR with pulmonology LUL finding. Pt feels well, he has pocket/site tenderness, no SOB, no pleuritic discomfort in any way O2 sat on RA is 97%.  No cough, fever, or symptoms of illness Dr. Tonia Brooms recommended given no symptoms, no ptx, to repeat in a couple weeks.  Discussed cxr with the patient, reviewed symptoms to return for.  He has pocket tenderness, no hematoma, no bleeding. Recommend dry cold pack and Tylenol.  Let us know if any change, swelling, or escalation. D/w Dr. Graciela Husbands, no ASA for 2 days, pt understands We reviewed wound care and activity restrictions.  I have ordered f/u CXR, and messaged Dr. Odessa Fleming nurse to help with scheduling to be done same day as his wound check.  Discussed with dr. Graciela Husbands, OK to discharge.  Francis Dowse, PA-C

## 2019-07-06 NOTE — Progress Notes (Signed)
Client c/o worse pain left chest ICD site; Paged Dr Graciela Husbands

## 2019-07-07 ENCOUNTER — Telehealth: Payer: Self-pay

## 2019-07-07 DIAGNOSIS — R918 Other nonspecific abnormal finding of lung field: Secondary | ICD-10-CM

## 2019-07-07 MED FILL — Lidocaine HCl Local Inj 1%: INTRAMUSCULAR | Qty: 60 | Status: AC

## 2019-07-07 NOTE — Telephone Encounter (Signed)
Spoke with pt and advised per Francis Dowse PA-C please have Chest Xray on 07/16/2019 prior to wound check appointment at  [facility]  Pt advised he will go to [facility] at [address]  May walk in for CRX.  Order placed.  Pt verbalized understanding and agrees with current plan.

## 2019-07-10 ENCOUNTER — Telehealth: Payer: Self-pay | Admitting: *Deleted

## 2019-07-10 ENCOUNTER — Encounter (HOSPITAL_COMMUNITY): Payer: Self-pay | Admitting: *Deleted

## 2019-07-10 DIAGNOSIS — Z955 Presence of coronary angioplasty implant and graft: Secondary | ICD-10-CM

## 2019-07-10 NOTE — Telephone Encounter (Signed)
ICD transmission from 07/07/19 at 02:01 reviewed. No auto lead tests available. No VT/VF episodes. Presenting rhythm VS 60s. VP 0%.  Spoke with pt. Reviewed discharge instructions. He denies any questions or concerns about wound or ICD. Pt agrees to send manual transmission for review. Instructions provided. Will call back when received.  ICD transmission from 07/10/19 reviewed. Normal ICD function. Presenting rhythm VS 80s. Lead tests appropriate. No episodes.  LMOVM (DPR) advising of normal transmission results. Direct DC phone number provided for any questions/concerns.

## 2019-07-10 NOTE — Telephone Encounter (Signed)
-----   Message from Sheilah Pigeon, [place] sent at 07/10/2019  8:20 AM EDT ----- I forgot to send this pt to you !!  Was a [place] ICD implant 5/17 same day discharge.  Will you please check for his next day transmission?   So sorry!  Renee

## 2019-07-10 NOTE — Progress Notes (Signed)
Austin [name] has completed the virtual cardiac rehab program and will continue exercise independently at this time. Patient's virtual cardiac rehab report will be scanned to media for review. [name], MS, ACSM CEP

## 2019-07-16 ENCOUNTER — Ambulatory Visit (INDEPENDENT_AMBULATORY_CARE_PROVIDER_SITE_OTHER): Payer: Managed Care, Other (non HMO) | Admitting: Emergency Medicine

## 2019-07-16 ENCOUNTER — Other Ambulatory Visit: Payer: Self-pay

## 2019-07-16 DIAGNOSIS — I5022 Chronic systolic (congestive) heart failure: Secondary | ICD-10-CM | POA: Diagnosis not present

## 2019-07-16 DIAGNOSIS — I428 Other cardiomyopathies: Secondary | ICD-10-CM | POA: Diagnosis not present

## 2019-07-16 LAB — CUP PACEART INCLINIC DEVICE CHECK
Battery Remaining Longevity: 180 mo
Brady Statistic RV Percent Paced: 1 %
Date Time Interrogation Session: 20210527085940
Implantable Lead Implant Date: 20210517
Implantable Lead Location: 753860
Implantable Lead Model: 138
Implantable Lead Serial Number: 302699
Implantable Pulse Generator Implant Date: 20210517
Lead Channel Pacing Threshold Amplitude: 0.7 V
Lead Channel Pacing Threshold Pulse Width: 0.4 ms
Lead Channel Sensing Intrinsic Amplitude: 21.9 mV
Pulse Gen Serial Number: 209485

## 2019-07-16 NOTE — Progress Notes (Signed)
Wound check appointment. Derma bond removed. Wound without redness or edema. Incision edges approximated, wound well healed. Normal device function. Thresholds, sensing, and impedances consistent with implant measurements. Device programmed at 3.5V for extra safety margin until 3 month visit. Histogram distribution appropriate for patient and level of activity. No ventricular arrhythmias noted. Patient educated about wound care, arm mobility, lifting restrictions, shock plan. ROV with Dr Graciela Husbands on 10/06/19. Remote transmission 10/08/19 and every 3 months after. See scanned in report.

## 2019-07-27 ENCOUNTER — Other Ambulatory Visit: Payer: Self-pay | Admitting: Internal Medicine

## 2019-08-10 ENCOUNTER — Other Ambulatory Visit: Payer: Self-pay | Admitting: Cardiovascular Disease

## 2019-08-13 ENCOUNTER — Encounter (HOSPITAL_COMMUNITY): Payer: Self-pay | Admitting: Cardiology

## 2019-08-13 ENCOUNTER — Other Ambulatory Visit: Payer: Self-pay

## 2019-08-13 ENCOUNTER — Ambulatory Visit (HOSPITAL_COMMUNITY)
Admission: RE | Admit: 2019-08-13 | Discharge: 2019-08-13 | Disposition: A | Discharge status: Home / Self Care | Payer: Managed Care, Other (non HMO) | Source: Ambulatory Visit | Attending: Cardiology | Admitting: Cardiology

## 2019-08-13 VITALS — BP 115/70 | HR 67 | Wt 179.2 lb

## 2019-08-13 DIAGNOSIS — Z7984 Long term (current) use of oral hypoglycemic drugs: Secondary | ICD-10-CM | POA: Insufficient documentation

## 2019-08-13 DIAGNOSIS — Z7982 Long term (current) use of aspirin: Secondary | ICD-10-CM | POA: Diagnosis not present

## 2019-08-13 DIAGNOSIS — Z7901 Long term (current) use of anticoagulants: Secondary | ICD-10-CM | POA: Insufficient documentation

## 2019-08-13 DIAGNOSIS — Z95810 Presence of automatic (implantable) cardiac defibrillator: Secondary | ICD-10-CM | POA: Diagnosis not present

## 2019-08-13 DIAGNOSIS — I200 Unstable angina: Secondary | ICD-10-CM | POA: Diagnosis not present

## 2019-08-13 DIAGNOSIS — I5022 Chronic systolic (congestive) heart failure: Secondary | ICD-10-CM | POA: Insufficient documentation

## 2019-08-13 DIAGNOSIS — Z79899 Other long term (current) drug therapy: Secondary | ICD-10-CM | POA: Diagnosis not present

## 2019-08-13 DIAGNOSIS — Z87891 Personal history of nicotine dependence: Secondary | ICD-10-CM | POA: Diagnosis not present

## 2019-08-13 DIAGNOSIS — I2510 Atherosclerotic heart disease of native coronary artery without angina pectoris: Secondary | ICD-10-CM | POA: Insufficient documentation

## 2019-08-13 DIAGNOSIS — K219 Gastro-esophageal reflux disease without esophagitis: Secondary | ICD-10-CM | POA: Insufficient documentation

## 2019-08-13 DIAGNOSIS — Z7902 Long term (current) use of antithrombotics/antiplatelets: Secondary | ICD-10-CM | POA: Diagnosis not present

## 2019-08-13 DIAGNOSIS — Z8249 Family history of ischemic heart disease and other diseases of the circulatory system: Secondary | ICD-10-CM | POA: Diagnosis not present

## 2019-08-13 LAB — BASIC METABOLIC PANEL
Anion gap: 10 (ref 5–15)
BUN: 17 mg/dL (ref 6–20)
CO2: 23 mmol/L (ref 22–32)
Calcium: 9.1 mg/dL (ref 8.9–10.3)
Chloride: 107 mmol/L (ref 98–111)
Creatinine, Ser: 0.82 mg/dL (ref 0.61–1.24)
GFR calc Af Amer: >60 mL/min (ref >60)
GFR calc non Af Amer: >60 mL/min (ref >60)
Glucose, Bld: 82 mg/dL (ref 70–99)
Potassium: 4.1 mmol/L (ref 3.5–5.1)
Sodium: 140 mmol/L (ref 135–145)

## 2019-08-13 NOTE — Progress Notes (Signed)
PCP: Wynn Banker, MD Cardiology: Dr. Eden Emms HF Cardiology: Dr. Shirlee Latch  54 y.o. with history of chronic systolic CHF and CAD returns for followup of CHF.  Patient was in his usual state of health until around 4/20.  He was admitted at that time with acute CHF, had been short of breath and orthopneic at home.  No chest pain.  At admission, he was found to be volume overloaded with echo showing EF 20%. He was started on cardiac meds and discharged.  Cardiac MRI in 10/20 showed EF 24%, moderately decreased RV systolic function, and full thickness LGE in the basal-mid inferior wall concerning for prior MI. He then had right and left heart cath in 12/20. This showed normal filling pressures and cardiac output.  He had DES to mid LCx/OM2 and distal RCA.    Echo in 3/21 showed EF 20-25% with diffuse hypokinesis.  Mildly decreased RV systolic function. He had [employer] ICD placed in 5/21.   He has been doing well symptomatically.  Working full time in Airline [employer].  Occasional mild dyspnea right after taking Brilinta, this is mitigated by Brilinta.  Otherwise, no exertional dyspnea.  No chest pain.  No lightheadedness.  Walking for exercise. Weight stable.    Labs (11/20): K 4.4, creatinine 0.68, hgb 13.5 Labs (1/21): K 3.9, creatinine 0.77 => 0.61 Labs (3/21): LDL 52 Labs (5/21): K 4, creatinine 0.79  PMH: 1. H/o Guillain Barre syndrome with recovery in 1984.  2. GERD 3. Nephrolithiasis 4. PUD 5. 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  Echo (4/20) with EF 20%, moderate to severe LV dilation, moderately decreased RV systolic function, mild-moderate MR.  - Cardiac MRI (10/20): EF 24%, severe LV dilation, diffuse hypokinesis, full thickness scar basal-mid inferolateral wall suggestive of prior MI.  - LHC/RHC (12/20): 90% mLCx/OM2 stenosis treated with DES, 80% distal RCA stenosis treated with DES.  Mean RA 1, mean PA 14, mean PCWP 9, CI 2.63.  - Echo (3/21): EF 20-25% -  [employer] ICD 6. Prior smoker.  7. CAD: LHC (12/20) with 90% mLCx/OM2 stenosis treated with DES, 80% distal RCA stenosis treated with DES.   FH: Mother with CHF developed in her 34s, father with PCI, brother with PCI.   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  No ETOH. Married, lives in [place].  Plays in a classic rock band.   ROS: All systems reviewed and negative except as per HPI.   Current Outpatient Medications  Medication Sig Dispense Refill  . acetaminophen (TYLENOL) 500 MG tablet Take 1,000 mg 2 (two) times daily as needed by mouth for moderate pain.    Marland Kitchen aspirin EC 81 MG tablet Take 1 tablet (81 mg total) by mouth daily. 30 tablet 11  . atorvastatin (LIPITOR) 80 MG tablet Take 1 tablet (80 mg total) by mouth daily. 30 tablet 11  . BRILINTA 90 MG TABS tablet TAKE 1 TABLET BY MOUTH TWICE A DAY 60 tablet 3  . carvedilol (COREG) 25 MG tablet Take 1 tablet (25 mg total) by mouth 2 (two) times daily with a meal. 60 tablet 5  . dapagliflozin propanediol (FARXIGA) 10 MG TABS tablet Take 10 mg by mouth daily before breakfast. 30 tablet 11  . MELATONIN PO Take 1 tablet by mouth at bedtime as needed (sleep).    Marland Kitchen omeprazole (PRILOSEC OTC) 20 MG tablet Take 20 mg by mouth daily as needed (acid reflux).     . sacubitril-valsartan (ENTRESTO) 97-103 MG  Take 1 tablet by mouth 2 (two) times daily. 60 tablet 11  . sertraline (ZOLOFT) 50 MG tablet Take 1 tablet (50 mg total) by mouth daily. 90 tablet 1  . spironolactone (ALDACTONE) 25 MG tablet Take 1 tablet (25 mg total) by mouth daily. 90 tablet 3   No current facility-administered medications for this encounter.   BP 115/70   Pulse 67   Wt 81.3 kg (179 lb 3.2 oz)   SpO2 97%   BMI 24.30 kg/m  General: NAD Neck: No JVD, no thyromegaly or thyroid nodule.  Lungs: Clear to auscultation bilaterally with normal respiratory effort. CV: Nondisplaced PMI.  Heart regular S1/S2, no S3/S4, no murmur.  No  peripheral edema.  No carotid bruit.  Normal pedal pulses.  Abdomen: Soft, nontender, no hepatosplenomegaly, no distention.  Skin: Intact without lesions or rashes.  Neurologic: Alert and oriented x 3.  Psych: Normal affect. Extremities: No clubbing or cyanosis.  HEENT: Normal.   Assessment/Plan: 1. Chronic systolic CHF: Suspect mixed ischemic/nonischemic cardiomyopathy.  I do not think that his CAD can explain the extent of the cardiomyopathy. Possible co-existing viral myocarditis though signs of this were not seen on MRI (cannot rule out).  No heavy ETOH or drug use.  Mother with CHF at age 72, uncertain of diagnosis => familial cardiomyopathy is a consideration.  Echo in 3/21 showed that EF remained 20-25% so [place] was placed.  NYHA class II symptoms.  Not volume overloaded on exam.  - Continue Coreg 25 mg bid.  - Continue Entresto 97/103 bid.  - Continue spironolactone 25 mg daily.  BMET today.  - Continue dapagliflozin.  2. CAD: 12/20 PCI to mid LCx/OM2 and distal RCA.  As above, I do not think CAD alone can account for his cardiomyopathy (though suspect it plays a role).   - Continue ASA 81 daily.  - Continue statin, good lipids in 3/21.  - Continue Brilinta to 12/21.  He has mild dyspnea after taking Brilinta but manageable.   BMET 3 months, followup in 6 months.   Loralie Champagne 08/13/2019

## 2019-08-13 NOTE — Patient Instructions (Signed)
Labs done today, your results will be available in MyChart, we will contact you for abnormal readings.  Labs needed in 3 months  Please call our office in December to schedule your follow up appoinment  If you have any questions or concerns before your next appointment please send [place] a message through D[place] or call our office at [phone].    TO LEAVE A MESSAGE FOR THE NURSE SELECT OPTION 2, PLEASE LEAVE A MESSAGE INCLUDING: . YOUR NAME . DATE OF BIRTH . CALL BACK NUMBER . REASON FOR CALL**this is important as we prioritize the call backs  YOU WILL RECEIVE A CALL BACK THE SAME DAY AS LONG AS YOU CALL BEFORE 4:00 PM  At the [facility], you and your health needs are our priority. As part of our continuing mission to provide you with exceptional heart care, we have created designated Provider Care Teams. These Care Teams include your primary Cardiologist (physician) and Advanced Practice Providers (APPs- Physician Assistants and Nurse Practitioners) who all work together to provide you with the care you need, when you need it.   You may see any of the following providers on your designated Care Team at your next follow up: Marland Kitchen Dr Arvilla Meres . Dr Marca Ancona . Tonye Becket, NP . Robbie Lis, PA . Karle Plumber, PharmD   Please be sure to bring in all your medications bottles to every appointment.

## 2019-08-14 ENCOUNTER — Other Ambulatory Visit: Payer: Self-pay

## 2019-08-17 ENCOUNTER — Other Ambulatory Visit (INDEPENDENT_AMBULATORY_CARE_PROVIDER_SITE_OTHER): Payer: Managed Care, Other (non HMO)

## 2019-08-17 ENCOUNTER — Other Ambulatory Visit: Payer: Self-pay

## 2019-08-17 DIAGNOSIS — Z13220 Encounter for screening for lipoid disorders: Secondary | ICD-10-CM

## 2019-08-17 DIAGNOSIS — R0602 Shortness of breath: Secondary | ICD-10-CM

## 2019-08-17 DIAGNOSIS — I5022 Chronic systolic (congestive) heart failure: Secondary | ICD-10-CM | POA: Diagnosis not present

## 2019-08-17 DIAGNOSIS — R918 Other nonspecific abnormal finding of lung field: Secondary | ICD-10-CM

## 2019-08-17 LAB — LIPID PANEL
Cholesterol: 125 mg/dL (ref 0–200)
HDL: 38.6 mg/dL — ABNORMAL LOW (ref >39.00)
LDL Cholesterol: 61 mg/dL (ref 0–99)
NonHDL: 86.74
Total CHOL/HDL Ratio: 3
Triglycerides: 128 mg/dL (ref 0.0–149.0)
VLDL: 25.6 mg/dL (ref 0.0–40.0)

## 2019-08-17 LAB — COMPREHENSIVE METABOLIC PANEL
ALT: 20 U/L (ref 0–53)
AST: 19 U/L (ref 0–37)
Albumin: 4.5 g/dL (ref 3.5–5.2)
Alkaline Phosphatase: 70 U/L (ref 39–117)
BUN: 21 mg/dL (ref 6–23)
CO2: 26 mEq/L (ref 19–32)
Calcium: 9.2 mg/dL (ref 8.4–10.5)
Chloride: 105 mEq/L (ref 96–112)
Creatinine, Ser: 0.73 mg/dL (ref 0.40–1.50)
GFR: 112.77 mL/min (ref >60.00)
Glucose, Bld: 101 mg/dL — ABNORMAL HIGH (ref 70–99)
Potassium: 4 mEq/L (ref 3.5–5.1)
Sodium: 140 mEq/L (ref 135–145)
Total Bilirubin: 0.5 mg/dL (ref 0.2–1.2)
Total Protein: 7.2 g/dL (ref 6.0–8.3)

## 2019-08-17 LAB — BASIC METABOLIC PANEL
BUN: 21 mg/dL (ref 6–23)
CO2: 26 mEq/L (ref 19–32)
Calcium: 9.2 mg/dL (ref 8.4–10.5)
Chloride: 105 mEq/L (ref 96–112)
Creatinine, Ser: 0.72 mg/dL (ref 0.40–1.50)
GFR: 114.58 mL/min (ref >60.00)
Glucose, Bld: 101 mg/dL — ABNORMAL HIGH (ref 70–99)
Potassium: 4 mEq/L (ref 3.5–5.1)
Sodium: 140 mEq/L (ref 135–145)

## 2019-08-17 LAB — CBC WITH DIFFERENTIAL/PLATELET
Basophils Absolute: 0.1 10*3/uL (ref 0.0–0.1)
Basophils Relative: 0.7 % (ref 0.0–3.0)
Eosinophils Absolute: 0.1 10*3/uL (ref 0.0–0.7)
Eosinophils Relative: 1.7 % (ref 0.0–5.0)
HCT: 40 % (ref 39.0–52.0)
Hemoglobin: 14 g/dL (ref 13.0–17.0)
Lymphocytes Relative: 20.8 % (ref 12.0–46.0)
Lymphs Abs: 1.5 10*3/uL (ref 0.7–4.0)
MCHC: 35 g/dL (ref 30.0–36.0)
MCV: 100 fl (ref 78.0–100.0)
Monocytes Absolute: 0.5 10*3/uL (ref 0.1–1.0)
Monocytes Relative: 7 % (ref 3.0–12.0)
Neutro Abs: 5.2 10*3/uL (ref 1.4–7.7)
Neutrophils Relative %: 69.8 % (ref 43.0–77.0)
Platelets: 218 10*3/uL (ref 150.0–400.0)
RBC: 4 Mil/uL — ABNORMAL LOW (ref 4.22–5.81)
RDW: 13 % (ref 11.5–15.5)
WBC: 7.5 10*3/uL (ref 4.0–10.5)

## 2019-08-17 LAB — TSH: TSH: 1.02 u[IU]/mL (ref 0.35–4.50)

## 2019-08-17 NOTE — Addendum Note (Signed)
Addended by: Lerry Liner on: 08/17/2019 08:42 AM   Modules accepted: Orders

## 2019-08-20 ENCOUNTER — Other Ambulatory Visit: Payer: Self-pay | Admitting: Cardiovascular Disease

## 2019-08-31 ENCOUNTER — Ambulatory Visit (INDEPENDENT_AMBULATORY_CARE_PROVIDER_SITE_OTHER): Payer: Managed Care, Other (non HMO) | Admitting: Family Medicine

## 2019-08-31 ENCOUNTER — Encounter: Payer: Self-pay | Admitting: Family Medicine

## 2019-08-31 ENCOUNTER — Other Ambulatory Visit: Payer: Self-pay

## 2019-08-31 VITALS — BP 100/80 | HR 71 | Temp 97.6°F | Ht 71.75 in | Wt 183.5 lb

## 2019-08-31 DIAGNOSIS — Z23 Encounter for immunization: Secondary | ICD-10-CM | POA: Diagnosis not present

## 2019-08-31 DIAGNOSIS — Z0000 Encounter for general adult medical examination without abnormal findings: Secondary | ICD-10-CM | POA: Diagnosis not present

## 2019-08-31 DIAGNOSIS — F419 Anxiety disorder, unspecified: Secondary | ICD-10-CM | POA: Diagnosis not present

## 2019-08-31 DIAGNOSIS — I5022 Chronic systolic (congestive) heart failure: Secondary | ICD-10-CM

## 2019-08-31 MED ORDER — ESCITALOPRAM OXALATE 10 MG PO TABS
10.0000 mg | ORAL_TABLET | Freq: Every day | ORAL | 1 refills | Status: DC
Start: 2019-08-31 — End: 2020-03-17

## 2019-08-31 NOTE — Progress Notes (Addendum)
Austin [name] DOB: Jul 26, 1965 Encounter date: 08/31/2019  This is a 54 y.o. male who presents for complete physical   History of present illness/Additional concerns:  Systolic HF s/p ICD placement 06/8525. CAD with DES to mid LCx/OM2 and distal RCA. This went well; he feels he is recovering pretty well from this. Slight left shoulder soreness, but improving.  Anxiety: has some diarrhea from zoloft. Overall feels like anxiety is better.   Colon cancer screening: (cologuard ordered 04/2019). Hasn't done it yet but is planning on this.   Past Medical History:  Diagnosis Date  . Anxiety   . Gastric ulcer   . GERD (gastroesophageal reflux disease)   . Guillain Barr syndrome (HCC) 1984   COMPLETE RECOVERY; triggered from flu illness  . Headache(784.0)    hx of tension-none recent  . Hematemesis 09/07/2016  . History of kidney stones 09/19/12   left ureteral stone-SURGERY 8/6 URETEROSCOPY / STENT PLACEMENT   . Partial gastric outlet obstruction   . UGIB (upper gastrointestinal bleed) 09/07/2016   Past Surgical History:  Procedure Laterality Date  . BALLOON DILATION N/A 11/15/2016   Procedure: BALLOON DILATION;  Surgeon: Benancio Deeds, MD;  Location: [facility];  Service: Gastroenterology;  Laterality: N/A;  . CORONARY STENT INTERVENTION N/A 01/21/2019   Procedure: CORONARY STENT INTERVENTION;  Surgeon: Kathleene Hazel, MD;  Location: [facility];  Service: Cardiovascular;  Laterality: N/A;  . CYSTO, LEFT RGP, DIAGNOSTIC URETEROSCOPY AND STENT PLACEMENT  09/24/2012  . CYSTOSCOPY WITH RETROGRADE PYELOGRAM, URETEROSCOPY AND STENT PLACEMENT Left 09/24/2012   Procedure: CYSTOSCOPY WITH RETROGRADE PYELOGRAM, DIAGNOSTIC URETEROSCOPY AND STENT PLACEMENT;  Surgeon: Sebastian Ache, MD;  Location: [facility];  Service: Urology;  Laterality: Left;  . CYSTOSCOPY WITH RETROGRADE PYELOGRAM, URETEROSCOPY AND STENT PLACEMENT Left 10/08/2012   Procedure: CYSTOSCOPY WITH RETROGRADE  PYELOGRAM, URETEROSCOPY AND STENT PLACEMENT;  Surgeon: Sebastian Ache, MD;  Location: [facility];  Service: Urology;  Laterality: Left;  1 HR NEEDS DIGITAL URETEROSCOPE   . ESOPHAGOGASTRODUODENOSCOPY N/A 09/08/2016   Procedure: ESOPHAGOGASTRODUODENOSCOPY (EGD);  Surgeon: Ruffin Frederick, MD;  Location: [facility];  Service: Gastroenterology;  Laterality: N/A;  . ESOPHAGOGASTRODUODENOSCOPY N/A 11/15/2016   Procedure: ESOPHAGOGASTRODUODENOSCOPY (EGD);  Surgeon: Benancio Deeds, MD;  Location: [facility];  Service: Gastroenterology;  Laterality: N/A;  . ESOPHAGOGASTRODUODENOSCOPY N/A 12/31/2016   Procedure: ESOPHAGOGASTRODUODENOSCOPY (EGD);  Surgeon: Benancio Deeds, MD;  Location: [facility];  Service: Gastroenterology;  Laterality: N/A;  . ESOPHAGOGASTRODUODENOSCOPY (EGD) WITH PROPOFOL N/A 10/15/2016   Procedure: ESOPHAGOGASTRODUODENOSCOPY (EGD) WITH PROPOFOL; Duodenal dilation;  Surgeon: Ruffin Frederick, MD;  Location: [facility];  Service: Gastroenterology;  Laterality: N/A;  . HOLMIUM LASER APPLICATION Left 10/08/2012   Procedure: HOLMIUM LASER APPLICATION;  Surgeon: Sebastian Ache, MD;  Location: [facility];  Service: Urology;  Laterality: Left;  . ICD IMPLANT N/A 07/06/2019   Procedure: ICD IMPLANT;  Surgeon: Duke Salvia, MD;  Location: [facility] INVASIVE CV LAB;  Service: Cardiovascular;  Laterality: N/A;  . RIGHT/LEFT HEART CATH AND CORONARY ANGIOGRAPHY N/A 01/21/2019   Procedure: RIGHT/LEFT HEART CATH AND CORONARY ANGIOGRAPHY;  Surgeon: Kathleene Hazel, MD;  Location: [facility];  Service: Cardiovascular;  Laterality: N/A;  . SAVORY DILATION N/A 12/31/2016   Procedure: POSSIBLE SAVORY DILATION;  Surgeon: Benancio Deeds, MD;  Location: [facility];  Service: Gastroenterology;  Laterality: N/A;  . TONSILLECTOMY     t&a as a child  . wisdom teeth extracted     No Known Allergies Current Meds  Medication  Sig  . acetaminophen (TYLENOL) 500 MG tablet  Take 1,000 mg 2 (two) times daily as needed by mouth for moderate pain.  Marland Kitchen aspirin EC 81 MG tablet Take 1 tablet (81 mg total) by mouth daily.  Marland Kitchen atorvastatin (LIPITOR) 80 MG tablet Take 1 tablet (80 mg total) by mouth daily.  Marland Kitchen BRILINTA 90 MG TABS tablet TAKE 1 TABLET BY MOUTH TWICE A DAY  . carvedilol (COREG) 25 MG tablet Take 1 tablet (25 mg total) by mouth 2 (two) times daily with a meal.  . dapagliflozin propanediol (FARXIGA) 10 MG TABS tablet Take 10 mg by mouth daily before breakfast.  . MELATONIN PO Take 1 tablet by mouth at bedtime as needed (sleep).  Marland Kitchen omeprazole (PRILOSEC OTC) 20 MG tablet Take 20 mg by mouth daily as needed (acid reflux).   . sacubitril-valsartan (ENTRESTO) 97-103 MG Take 1 tablet by mouth 2 (two) times daily.  Marland Kitchen spironolactone (ALDACTONE) 25 MG tablet Take 1 tablet (25 mg total) by mouth daily.  . [DISCONTINUED] sertraline (ZOLOFT) 50 MG tablet Take 1 tablet (50 mg total) by mouth daily.   Social History   Tobacco Use  . Smoking status: Former Smoker    Packs/day: 1.50    Years: 28.00    Pack years: 42.00    Types: Cigarettes    Quit date: 05/02/2018    Years since quitting: 1.3  . Smokeless tobacco: Never Used  Substance Use Topics  . Alcohol use: Yes    Comment: occasional   Family History  Problem Relation Age of Onset  . Heart disease Mother   . Congestive Heart Failure Mother   . Ulcerative colitis Father   . Hemachromatosis Father   . Rheum arthritis Father   . Heart disease Brother 48       stenting     Review of Systems  Constitutional: Negative for activity change, appetite change, chills, fatigue, fever and unexpected weight change.  HENT: Negative for congestion, ear pain, hearing loss, sinus pressure, sinus pain, sore throat and trouble swallowing.   Eyes: Negative for pain and visual disturbance.  Respiratory: Negative for cough, chest tightness, shortness of breath and wheezing.   Cardiovascular: Negative for chest pain,  palpitations and leg swelling.  Gastrointestinal: Negative for abdominal distention, abdominal pain, blood in stool, constipation, diarrhea, nausea and vomiting.  Genitourinary: Negative for decreased urine volume, difficulty urinating, dysuria, penile pain and testicular pain.  Musculoskeletal: Negative for arthralgias, back pain and joint swelling.  Skin: Negative for rash.  Neurological: Negative for dizziness, weakness, numbness and headaches.  Hematological: Negative for adenopathy. Does not bruise/bleed easily.  Psychiatric/Behavioral: Negative for agitation, sleep disturbance and suicidal ideas. The patient is not nervous/anxious.     CBC:  Lab Results  Component Value Date   WBC 7.5 08/17/2019   HGB 14.0 08/17/2019   HGB 14.9 07/02/2019   HCT 40.0 08/17/2019   HCT 42.7 07/02/2019   MCH 34.1 (H) 07/02/2019   MCH 33.4 06/07/2018   MCHC 35.0 08/17/2019   RDW 13.0 08/17/2019   RDW 12.9 07/02/2019   PLT 218.0 08/17/2019   PLT 246 07/02/2019   MPV 8.4 09/11/2012   CMP: Lab Results  Component Value Date   NA 140 08/17/2019   NA 141 07/02/2019   K 4.0 08/17/2019   CL 105 08/17/2019   CO2 26 08/17/2019   ANIONGAP 10 08/13/2019   GLUCOSE 101 (H) 08/17/2019   BUN 21 08/17/2019   BUN 16 07/02/2019   CREATININE 0.72  08/17/2019   CREATININE 1.23 09/11/2012   GFRAA >60 08/13/2019   CALCIUM 9.2 08/17/2019   PROT 7.2 08/17/2019   BILITOT 0.5 08/17/2019   ALKPHOS 70 08/17/2019   ALT 20 08/17/2019   AST 19 08/17/2019   LIPID: Lab Results  Component Value Date   CHOL 125 08/17/2019   TRIG 128.0 08/17/2019   HDL 38.60 (L) 08/17/2019   LDLCALC 61 08/17/2019    Objective:  BP 100/80 (BP Location: Left Arm, Patient Position: Sitting, Cuff Size: Normal)   Pulse 71   Temp 97.6 F (36.4 C) (Oral)   Ht 5' 11.75" (1.822 m)   Wt 183 lb 8 oz (83.2 kg)   SpO2 97%   BMI 25.06 kg/m   Weight: 183 lb 8 oz (83.2 kg)   BP Readings from Last 3 Encounters:  08/31/19 100/80   08/13/19 115/70  07/06/19 116/64   Wt Readings from Last 3 Encounters:  08/31/19 183 lb 8 oz (83.2 kg)  08/13/19 179 lb 3.2 oz (81.3 kg)  07/06/19 177 lb (80.3 kg)    Physical Exam Constitutional:      General: He is not in acute distress.    Appearance: He is well-developed.  HENT:     Head: Normocephalic and atraumatic.     Right Ear: External ear normal.     Left Ear: External ear normal.     Nose: Nose normal.     Mouth/Throat:     Tongue: Tongue deviates from midline (left deviation).     Pharynx: No oropharyngeal exudate.     Comments: Soft pallate and tongue deviation to left Eyes:     Conjunctiva/sclera: Conjunctivae normal.     Pupils: Pupils are equal, round, and reactive to light.  Neck:     Thyroid: No thyromegaly.  Cardiovascular:     Rate and Rhythm: Normal rate and regular rhythm.     Heart sounds: Normal heart sounds. No murmur heard.  No friction rub. No gallop.   Pulmonary:     Effort: Pulmonary effort is normal. No respiratory distress.     Breath sounds: Normal breath sounds. No stridor. No wheezing or rales.  Abdominal:     General: Bowel sounds are normal.     Palpations: Abdomen is soft.  Musculoskeletal:        General: Normal range of motion.     Cervical back: Neck supple.  Skin:    General: Skin is warm and dry.  Neurological:     Mental Status: He is alert and oriented to person, place, and time.  Psychiatric:        Behavior: Behavior normal.        Thought Content: Thought content normal.        Judgment: Judgment normal.     Assessment/Plan: Health Maintenance Due  Topic Date Due  . Hepatitis C Screening  Never done  . COVID-19 Vaccine (1) Never done  . COLONOSCOPY  Never done   Health Maintenance reviewed - Tdap given today; will  Use some hesitance with other recommended vaccines due to hx of guillain barre. Prefer doing one vaccine at a time.   1. Preventative health care We discussed Shingrix vaccine as well today, but  we will start with just the tetanus.  He is going to read more about the Shingrix vaccine.  Keep up with healthy eating.  Restart exercise routine. - Hepatitis C antibody; Future - Tdap vaccine greater than or equal to 7yo IM; Future  2. Anxiety  Anxiety is much better controlled on the Zoloft, but he does have some loose stools with this.  We are to switch over to Lexapro 10 mg daily.  Let me know if any problems with this medication.   3. Chronic systolic congestive heart failure [facility]) Following with cardiology.  Has been stable.  He has done well with recent ICD implantation. - Comprehensive metabolic panel; Future - CBC with Differential/Platelet; Future - Lipid panel; Future  Return in about 6 months (around 03/02/2020) for labwork in december-jan prior to appointment.  Theodis Shove, MD

## 2019-08-31 NOTE — Patient Instructions (Addendum)
Zoster Vaccine, Recombinant injection What is this medicine? ZOSTER VACCINE (ZOS ter vak SEEN) is used to prevent shingles in adults 52 years old and over. This vaccine is not used to treat shingles or nerve pain from shingles. This medicine may be used for other purposes; ask your health care provider or pharmacist if you have questions. COMMON BRAND NAME(S): SHINGRIX What should I tell my health care provider before I take this medicine? They need to know if you have any of these conditions:  blood disorders or disease  cancer like leukemia or lymphoma  immune system problems or therapy  an unusual or allergic reaction to vaccines, other medications, foods, dyes, or preservatives  pregnant or trying to get pregnant  breast-feeding How should I use this medicine? This vaccine is for injection in a muscle. It is given by a health care professional. Talk to your pediatrician regarding the use of this medicine in children. This medicine is not approved for use in children. Overdosage: If you think you have taken too much of this medicine contact a poison control center or emergency room at once. NOTE: This medicine is only for you. Do not share this medicine with others. What if I miss a dose? Keep appointments for follow-up (booster) doses as directed. It is important not to miss your dose. Call your doctor or health care professional if you are unable to keep an appointment. What may interact with this medicine?  medicines that suppress your immune system  medicines to treat cancer  steroid medicines like prednisone or cortisone This list may not describe all possible interactions. Give your health care provider a list of all the medicines, herbs, non-prescription drugs, or dietary supplements you use. Also tell them if you smoke, drink alcohol, or use illegal drugs. Some items may interact with your medicine. What should I watch for while using this medicine? Visit your doctor for  regular check ups. This vaccine, like all vaccines, may not fully protect everyone. What side effects may I notice from receiving this medicine? Side effects that you should report to your doctor or health care professional as soon as possible:  allergic reactions like skin rash, itching or hives, swelling of the face, lips, or tongue  breathing problems Side effects that usually do not require medical attention (report these to your doctor or health care professional if they continue or are bothersome):  chills  headache  fever  nausea, vomiting  redness, warmth, pain, swelling or itching at site where injected  tiredness This list may not describe all possible side effects. Call your doctor for medical advice about side effects. You may report side effects to FDA at 1-800-FDA-1088. Where should I keep my medicine? This vaccine is only given in a clinic, pharmacy, doctor's office, or other health care setting and will not be stored at home. NOTE: This sheet is a summary. It may not cover all possible information. If you have questions about this medicine, talk to your doctor, pharmacist, or health care provider.  2020 Elsevier/Gold Standard (2016-09-17 13:20:30)  

## 2019-08-31 NOTE — Addendum Note (Signed)
Addended by: Johnella Moloney on: 08/31/2019 09:42 AM   Modules accepted: Orders

## 2019-09-23 LAB — COLOGUARD

## 2019-09-23 LAB — EXTERNAL GENERIC LAB PROCEDURE

## 2019-10-01 LAB — COLOGUARD: Cologuard: NEGATIVE

## 2019-10-06 ENCOUNTER — Encounter: Payer: Managed Care, Other (non HMO) | Admitting: Internal Medicine

## 2019-10-07 LAB — COLOGUARD: COLOGUARD: NEGATIVE

## 2019-10-07 LAB — EXTERNAL GENERIC LAB PROCEDURE: COLOGUARD: NEGATIVE

## 2019-10-08 ENCOUNTER — Encounter: Payer: Self-pay | Admitting: Family Medicine

## 2019-10-08 ENCOUNTER — Ambulatory Visit (INDEPENDENT_AMBULATORY_CARE_PROVIDER_SITE_OTHER): Payer: Managed Care, Other (non HMO) | Admitting: *Deleted

## 2019-10-08 DIAGNOSIS — I428 Other cardiomyopathies: Secondary | ICD-10-CM

## 2019-10-08 LAB — CUP PACEART REMOTE DEVICE CHECK
Battery Remaining Longevity: 180 mo
Battery Remaining Percentage: 100 %
Brady Statistic RV Percent Paced: 0 %
Date Time Interrogation Session: 20210819151600
HighPow Impedance: 78 Ohm
Implantable Lead Implant Date: 20210517
Implantable Lead Location: 753860
Implantable Lead Model: 138
Implantable Lead Serial Number: 302699
Implantable Pulse Generator Implant Date: 20210517
Lead Channel Impedance Value: 801 Ohm
Lead Channel Setting Pacing Amplitude: 3.5 V
Lead Channel Setting Pacing Pulse Width: 0.4 ms
Lead Channel Setting Sensing Sensitivity: 0.6 mV
Pulse Gen Serial Number: 209485

## 2019-10-09 NOTE — Progress Notes (Signed)
Remote ICD transmission.   

## 2019-10-22 ENCOUNTER — Other Ambulatory Visit: Payer: Self-pay | Admitting: Cardiovascular Disease

## 2019-10-31 DIAGNOSIS — I255 Ischemic cardiomyopathy: Secondary | ICD-10-CM | POA: Insufficient documentation

## 2019-10-31 DIAGNOSIS — I428 Other cardiomyopathies: Secondary | ICD-10-CM | POA: Insufficient documentation

## 2019-10-31 DIAGNOSIS — Z95810 Presence of automatic (implantable) cardiac defibrillator: Secondary | ICD-10-CM | POA: Insufficient documentation

## 2019-11-02 ENCOUNTER — Encounter: Payer: Self-pay | Admitting: Internal Medicine

## 2019-11-02 ENCOUNTER — Other Ambulatory Visit: Payer: Self-pay

## 2019-11-02 ENCOUNTER — Ambulatory Visit (INDEPENDENT_AMBULATORY_CARE_PROVIDER_SITE_OTHER): Payer: Managed Care, Other (non HMO) | Admitting: Internal Medicine

## 2019-11-02 VITALS — BP 125/86 | HR 71 | Ht 72.0 in | Wt 187.8 lb

## 2019-11-02 DIAGNOSIS — I5022 Chronic systolic (congestive) heart failure: Secondary | ICD-10-CM | POA: Diagnosis not present

## 2019-11-02 DIAGNOSIS — I428 Other cardiomyopathies: Secondary | ICD-10-CM | POA: Diagnosis not present

## 2019-11-02 DIAGNOSIS — Z95810 Presence of automatic (implantable) cardiac defibrillator: Secondary | ICD-10-CM | POA: Diagnosis not present

## 2019-11-02 DIAGNOSIS — I255 Ischemic cardiomyopathy: Secondary | ICD-10-CM

## 2019-11-02 NOTE — Progress Notes (Signed)
Patient Care Team: Wynn Banker, MD as PCP - General (Family Medicine) Wendall Stade, MD as PCP - Cardiology (Cardiology)   HPI  Austin [name] is a 54 y.o. male seen in followup for an ICD implanted for primary prevention in the setting of ischemic heart disease and modest congestive heart failure.    Further evaluation fall/2020 demonstrated a full-thickness scar by MRI scanning CTA was concerning for occlusive coronary disease and he underwent catheterization and subsequent stenting 12/20.  He has had some breathlessness which is gradually resolving following his stenting and the use of Brilinta.  Breathlessness is better. No chest pain. No edema  Mostly unaware of the device except when driving    FHx + CAD.  DATE TEST EF   4/20 Echo   20 % INF AK   10/20 cMRI   Full thickness Infer Scar  11/20 CTA  24 %   12/20 LHC  LADp20; OM2 90>>stent ;RCAd 80>Stent  3/21 Echo  20-25%   4/21 cMRI  27% 2/3 thickness scar     Date Cr K Hgb  3/21 0.59 4.1 15.0 (12/20)      Records and Results Reviewed   Past Medical History:  Diagnosis Date  . Anxiety   . Gastric ulcer   . GERD (gastroesophageal reflux disease)   . Guillain Barr syndrome (HCC) 1984   COMPLETE RECOVERY; triggered from flu illness  . Headache(784.0)    hx of tension-none recent  . Hematemesis 09/07/2016  . History of kidney stones 09/19/12   left ureteral stone-SURGERY 8/6 URETEROSCOPY / STENT PLACEMENT   . Partial gastric outlet obstruction   . UGIB (upper gastrointestinal bleed) 09/07/2016    Past Surgical History:  Procedure Laterality Date  . BALLOON DILATION N/A 11/15/2016   Procedure: BALLOON DILATION;  Surgeon: Benancio Deeds, MD;  Location: [facility];  Service: Gastroenterology;  Laterality: N/A;  . CORONARY STENT INTERVENTION N/A 01/21/2019   Procedure: CORONARY STENT INTERVENTION;  Surgeon: Kathleene Hazel, MD;  Location: [facility];  Service:  Cardiovascular;  Laterality: N/A;  . CYSTO, LEFT RGP, DIAGNOSTIC URETEROSCOPY AND STENT PLACEMENT  09/24/2012  . CYSTOSCOPY WITH RETROGRADE PYELOGRAM, URETEROSCOPY AND STENT PLACEMENT Left 09/24/2012   Procedure: CYSTOSCOPY WITH RETROGRADE PYELOGRAM, DIAGNOSTIC URETEROSCOPY AND STENT PLACEMENT;  Surgeon: Sebastian Ache, MD;  Location: [facility];  Service: Urology;  Laterality: Left;  . CYSTOSCOPY WITH RETROGRADE PYELOGRAM, URETEROSCOPY AND STENT PLACEMENT Left 10/08/2012   Procedure: CYSTOSCOPY WITH RETROGRADE PYELOGRAM, URETEROSCOPY AND STENT PLACEMENT;  Surgeon: Sebastian Ache, MD;  Location: [facility];  Service: Urology;  Laterality: Left;  1 HR NEEDS DIGITAL URETEROSCOPE   . ESOPHAGOGASTRODUODENOSCOPY N/A 09/08/2016   Procedure: ESOPHAGOGASTRODUODENOSCOPY (EGD);  Surgeon: Ruffin Frederick, MD;  Location: [facility];  Service: Gastroenterology;  Laterality: N/A;  . ESOPHAGOGASTRODUODENOSCOPY N/A 11/15/2016   Procedure: ESOPHAGOGASTRODUODENOSCOPY (EGD);  Surgeon: Benancio Deeds, MD;  Location: [facility];  Service: Gastroenterology;  Laterality: N/A;  . ESOPHAGOGASTRODUODENOSCOPY N/A 12/31/2016   Procedure: ESOPHAGOGASTRODUODENOSCOPY (EGD);  Surgeon: Benancio Deeds, MD;  Location: [facility];  Service: Gastroenterology;  Laterality: N/A;  . ESOPHAGOGASTRODUODENOSCOPY (EGD) WITH PROPOFOL N/A 10/15/2016   Procedure: ESOPHAGOGASTRODUODENOSCOPY (EGD) WITH PROPOFOL; Duodenal dilation;  Surgeon: Ruffin Frederick, MD;  Location: [facility]OSCOPY;  Service: Gastroenterology;  Laterality: N/A;  . HOLMIUM LASER APPLICATION Left 10/08/2012   Procedure: HOLMIUM LASER APPLICATION;  Surgeon: Sebastian Ache, MD;  Location: [facility];  Service: Urology;  Laterality: Left;  . ICD  IMPLANT N/A 07/06/2019   Procedure: ICD IMPLANT;  Surgeon: Duke Salvia, MD;  Location: [facility] INVASIVE CV LAB;  Service: Cardiovascular;  Laterality: N/A;  . RIGHT/LEFT HEART CATH AND CORONARY ANGIOGRAPHY N/A 01/21/2019    Procedure: RIGHT/LEFT HEART CATH AND CORONARY ANGIOGRAPHY;  Surgeon: Kathleene Hazel, MD;  Location: [facility];  Service: Cardiovascular;  Laterality: N/A;  . SAVORY DILATION N/A 12/31/2016   Procedure: POSSIBLE SAVORY DILATION;  Surgeon: Benancio Deeds, MD;  Location: [facility];  Service: Gastroenterology;  Laterality: N/A;  . TONSILLECTOMY     t&a as a child  . wisdom teeth extracted      Current Meds  Medication Sig  . acetaminophen (TYLENOL) 500 MG tablet Take 1,000 mg 2 (two) times daily as needed by mouth for moderate pain.  Marland Kitchen aspirin EC 81 MG tablet Take 1 tablet (81 mg total) by mouth daily.  Marland Kitchen atorvastatin (LIPITOR) 80 MG tablet Take 1 tablet (80 mg total) by mouth daily.  Marland Kitchen BRILINTA 90 MG TABS tablet TAKE 1 TABLET BY MOUTH TWICE A DAY  . carvedilol (COREG) 25 MG tablet Take 1 tablet (25 mg total) by mouth 2 (two) times daily with a meal.  . dapagliflozin propanediol (FARXIGA) 10 MG TABS tablet Take 10 mg by mouth daily before breakfast.  . ENTRESTO 97-103 MG TAKE 1 TABLET BY MOUTH TWICE A DAY  . escitalopram (LEXAPRO) 10 MG tablet Take 1 tablet (10 mg total) by mouth daily.  Marland Kitchen MELATONIN PO Take 1 tablet by mouth at bedtime as needed (sleep).  Marland Kitchen omeprazole (PRILOSEC OTC) 20 MG tablet Take 20 mg by mouth daily as needed (acid reflux).   Marland Kitchen spironolactone (ALDACTONE) 25 MG tablet Take 1 tablet (25 mg total) by mouth daily.    No Known Allergies    Review of Systems negative except from HPI and PMH  Physical Exam BP 125/86   Pulse 71   Ht 6' (1.829 m)   Wt 187 lb 12.8 oz (85.2 kg)   SpO2 94%   BMI 25.47 kg/m  Well developed and well nourished in no acute distress HENT normal Neck supple with JVP-flat Clear Device pocket well healed; without hematoma or erythema.  There is no tethering  Regular rate and rhythm, no   murmur Abd-soft with active BS No Clubbing cyanosis   edema Skin-warm and dry A & Oriented  Grossly normal sensory and motor  function  ECG sinus at 68 Interval 16/10/39 Nonspecific T wave changes  CrCl cannot be calculated (Patient's most recent lab result is older than the maximum 21 days allowed.).        Assessment and Plan:  Ischemic and Nonischemic Cardiomyopathy  CAD s/p revascularization RCA and OM  Congestive heart failure-chronic-systolic class II  Implantable defibrillator-[employer]  Vaccination status negative  Guillain-Barr history   Euvolemic continue current meds  Without symptoms of ischemia  Lengthy discussion regarding Covid vaccination in patients with prior Guillain-Barr; a brief literature search found more Covid infection associations and vaccine association; however could not find anything in patients with prior GI bradycardia. Will reach out to a friend at the [facility].  Device function is normal.        Sherryl Manges

## 2019-11-02 NOTE — Patient Instructions (Signed)
Medication Instructions:  Your physician recommends that you continue on your current medications as directed. Please refer to the Current Medication list given to you today.  *If you need a refill on your cardiac medications before your next appointment, please call your pharmacy*   Lab Work: None ordered.  If you have labs (blood work) drawn today and your tests are completely normal, you will receive your results only by: . MyChart Message (if you have MyChart) OR . A paper copy in the mail If you have any lab test that is abnormal or we need to change your treatment, we will call you to review the results.   Testing/Procedures: None ordered.    Follow-Up: At [facility], you and your health needs are our priority.  As part of our continuing mission to provide you with exceptional heart care, we have created designated Provider Care Teams.  These Care Teams include your primary Cardiologist (physician) and Advanced Practice Providers (APPs -  Physician Assistants and Nurse Practitioners) who all work together to provide you with the care you need, when you need it.  We recommend signing up for the patient portal called "MyChart".  Sign up information is provided on this After Visit Summary.  MyChart is used to connect with patients for Virtual Visits (Telemedicine).  Patients are able to view lab/test results, encounter notes, upcoming appointments, etc.  Non-urgent messages can be sent to your provider as well.   To learn more about what you can do with MyChart, go to [email or web address].    Your next appointment:   9 months  The format for your next appointment:   In Person  Provider:   Steven Klein, MD     

## 2019-11-03 LAB — CUP PACEART INCLINIC DEVICE CHECK
Brady Statistic RV Percent Paced: 1 % — CL
Date Time Interrogation Session: 20210913000000
HighPow Impedance: 76 Ohm
Implantable Lead Implant Date: 20210517
Implantable Lead Location: 753860
Implantable Lead Model: 138
Implantable Lead Serial Number: 302699
Implantable Pulse Generator Implant Date: 20210517
Lead Channel Impedance Value: 803 Ohm
Lead Channel Pacing Threshold Amplitude: 0.8 V
Lead Channel Pacing Threshold Pulse Width: 0.4 ms
Lead Channel Sensing Intrinsic Amplitude: 21.8 mV
Lead Channel Setting Pacing Amplitude: 2.5 V
Lead Channel Setting Pacing Pulse Width: 0.4 ms
Lead Channel Setting Sensing Sensitivity: 0.6 mV
Pulse Gen Serial Number: 209485

## 2019-11-09 ENCOUNTER — Other Ambulatory Visit: Payer: Self-pay | Admitting: Cardiovascular Disease

## 2019-11-12 ENCOUNTER — Other Ambulatory Visit (HOSPITAL_COMMUNITY): Payer: Self-pay

## 2019-11-12 DIAGNOSIS — I5022 Chronic systolic (congestive) heart failure: Secondary | ICD-10-CM

## 2019-11-12 DIAGNOSIS — E785 Hyperlipidemia, unspecified: Secondary | ICD-10-CM

## 2019-11-13 ENCOUNTER — Other Ambulatory Visit: Payer: Self-pay

## 2019-11-13 ENCOUNTER — Ambulatory Visit (HOSPITAL_COMMUNITY)
Admission: RE | Admit: 2019-11-13 | Discharge: 2019-11-13 | Disposition: A | Discharge status: Home / Self Care | Payer: Managed Care, Other (non HMO) | Source: Ambulatory Visit | Attending: Cardiology | Admitting: Cardiology

## 2019-11-13 DIAGNOSIS — E785 Hyperlipidemia, unspecified: Secondary | ICD-10-CM | POA: Insufficient documentation

## 2019-11-13 DIAGNOSIS — I5022 Chronic systolic (congestive) heart failure: Secondary | ICD-10-CM | POA: Insufficient documentation

## 2019-11-13 LAB — BASIC METABOLIC PANEL
Anion gap: 10 (ref 5–15)
BUN: 20 mg/dL (ref 6–20)
CO2: 25 mmol/L (ref 22–32)
Calcium: 9.2 mg/dL (ref 8.9–10.3)
Chloride: 107 mmol/L (ref 98–111)
Creatinine, Ser: 0.9 mg/dL (ref 0.61–1.24)
GFR calc Af Amer: >60 mL/min (ref >60)
GFR calc non Af Amer: >60 mL/min (ref >60)
Glucose, Bld: 117 mg/dL — ABNORMAL HIGH (ref 70–99)
Potassium: 4.1 mmol/L (ref 3.5–5.1)
Sodium: 142 mmol/L (ref 135–145)

## 2019-11-13 LAB — LIPID PANEL
Cholesterol: 133 mg/dL (ref 0–200)
HDL: 41 mg/dL (ref >40)
LDL Cholesterol: 68 mg/dL (ref 0–99)
Total CHOL/HDL Ratio: 3.2 RATIO
Triglycerides: 121 mg/dL (ref <150)
VLDL: 24 mg/dL (ref 0–40)

## 2019-11-13 NOTE — Addendum Note (Signed)
Encounter addended by: Chinita Pester, CMA on: 11/13/2019 12:26 PM  Actions taken: Charge Capture section accepted

## 2019-12-15 ENCOUNTER — Other Ambulatory Visit: Payer: Self-pay | Admitting: Cardiovascular Disease

## 2020-01-07 ENCOUNTER — Ambulatory Visit (INDEPENDENT_AMBULATORY_CARE_PROVIDER_SITE_OTHER): Payer: Managed Care, Other (non HMO)

## 2020-01-07 DIAGNOSIS — I428 Other cardiomyopathies: Secondary | ICD-10-CM

## 2020-01-08 LAB — CUP PACEART REMOTE DEVICE CHECK
Battery Remaining Longevity: 180 mo
Battery Remaining Percentage: 100 %
Brady Statistic RV Percent Paced: 0 %
Date Time Interrogation Session: 20211118163200
HighPow Impedance: 77 Ohm
Implantable Lead Implant Date: 20210517
Implantable Lead Location: 753860
Implantable Lead Model: 138
Implantable Lead Serial Number: 302699
Implantable Pulse Generator Implant Date: 20210517
Lead Channel Impedance Value: 696 Ohm
Lead Channel Setting Pacing Amplitude: 2.5 V
Lead Channel Setting Pacing Pulse Width: 0.4 ms
Lead Channel Setting Sensing Sensitivity: 0.6 mV
Pulse Gen Serial Number: 209485

## 2020-01-11 NOTE — Progress Notes (Signed)
Remote ICD transmission.   

## 2020-02-01 ENCOUNTER — Other Ambulatory Visit: Payer: Managed Care, Other (non HMO)

## 2020-02-01 ENCOUNTER — Other Ambulatory Visit: Payer: Self-pay

## 2020-02-01 DIAGNOSIS — Z0000 Encounter for general adult medical examination without abnormal findings: Secondary | ICD-10-CM

## 2020-02-01 DIAGNOSIS — I5022 Chronic systolic (congestive) heart failure: Secondary | ICD-10-CM

## 2020-02-02 LAB — CBC WITH DIFFERENTIAL/PLATELET
Absolute Monocytes: 571 cells/uL (ref 200–950)
Basophils Absolute: 42 cells/uL (ref 0–200)
Basophils Relative: 0.5 %
Eosinophils Absolute: 134 cells/uL (ref 15–500)
Eosinophils Relative: 1.6 %
HCT: 45.3 % (ref 38.5–50.0)
Hemoglobin: 15.5 g/dL (ref 13.2–17.1)
Lymphs Abs: 1638 cells/uL (ref 850–3900)
MCH: 33.3 pg — ABNORMAL HIGH (ref 27.0–33.0)
MCHC: 34.2 g/dL (ref 32.0–36.0)
MCV: 97.2 fL (ref 80.0–100.0)
MPV: 9.7 fL (ref 7.5–12.5)
Monocytes Relative: 6.8 %
Neutro Abs: 6014 cells/uL (ref 1500–7800)
Neutrophils Relative %: 71.6 %
Platelets: 240 10*3/uL (ref 140–400)
RBC: 4.66 10*6/uL (ref 4.20–5.80)
RDW: 12.9 % (ref 11.0–15.0)
Total Lymphocyte: 19.5 %
WBC: 8.4 10*3/uL (ref 3.8–10.8)

## 2020-02-02 LAB — COMPREHENSIVE METABOLIC PANEL
AG Ratio: 1.5 (calc) (ref 1.0–2.5)
ALT: 24 U/L (ref 9–46)
AST: 21 U/L (ref 10–35)
Albumin: 4.5 g/dL (ref 3.6–5.1)
Alkaline phosphatase (APISO): 67 U/L (ref 35–144)
BUN: 19 mg/dL (ref 7–25)
CO2: 26 mmol/L (ref 20–32)
Calcium: 10.1 mg/dL (ref 8.6–10.3)
Chloride: 104 mmol/L (ref 98–110)
Creat: 0.86 mg/dL (ref 0.70–1.33)
Globulin: 3.1 g/dL (calc) (ref 1.9–3.7)
Glucose, Bld: 105 mg/dL — ABNORMAL HIGH (ref 65–99)
Potassium: 5 mmol/L (ref 3.5–5.3)
Sodium: 142 mmol/L (ref 135–146)
Total Bilirubin: 0.4 mg/dL (ref 0.2–1.2)
Total Protein: 7.6 g/dL (ref 6.1–8.1)

## 2020-02-02 LAB — HEPATITIS C ANTIBODY
Hepatitis C Ab: NONREACTIVE
SIGNAL TO CUT-OFF: 0.03 (ref <1.00)

## 2020-02-02 LAB — LIPID PANEL
Cholesterol: 149 mg/dL (ref <200)
HDL: 47 mg/dL (ref >=40)
LDL Cholesterol (Calc): 78 mg/dL (calc)
Non-HDL Cholesterol (Calc): 102 mg/dL (calc) (ref <130)
Total CHOL/HDL Ratio: 3.2 (calc) (ref <5.0)
Triglycerides: 141 mg/dL (ref <150)

## 2020-02-11 ENCOUNTER — Other Ambulatory Visit: Payer: Self-pay | Admitting: Family Medicine

## 2020-03-14 ENCOUNTER — Encounter: Payer: Self-pay | Admitting: Family Medicine

## 2020-03-14 ENCOUNTER — Other Ambulatory Visit (HOSPITAL_COMMUNITY): Payer: Self-pay | Admitting: *Deleted

## 2020-03-14 MED ORDER — CARVEDILOL 25 MG PO TABS
25.0000 mg | ORAL_TABLET | Freq: Two times a day (BID) | ORAL | 5 refills | Status: DC
Start: 2020-03-14 — End: 2020-09-05

## 2020-03-17 ENCOUNTER — Other Ambulatory Visit: Payer: Self-pay | Admitting: Family Medicine

## 2020-03-17 MED ORDER — CLONAZEPAM 0.5 MG PO TABS
0.2500 mg | ORAL_TABLET | Freq: Every day | ORAL | 2 refills | Status: DC | PRN
Start: 2020-03-17 — End: 2020-09-02

## 2020-03-18 ENCOUNTER — Telehealth: Payer: Self-pay | Admitting: *Deleted

## 2020-03-18 NOTE — Telephone Encounter (Signed)
-----   Message from Wynn Banker, MD sent at 03/17/2020  6:32 PM EST ----- Please set up follow up visit for him. If feeling well; no rush; he was just coming off of anxiety medication; so wanted to set up follow up for this.

## 2020-03-18 NOTE — Telephone Encounter (Signed)
Spoke with the pt and he stated he is feeling fine, is going out of town and preferred to await an appt for next week.  Appt scheduled for 1/31 at 10am.

## 2020-03-21 ENCOUNTER — Encounter: Payer: Self-pay | Admitting: Family Medicine

## 2020-03-21 ENCOUNTER — Telehealth (INDEPENDENT_AMBULATORY_CARE_PROVIDER_SITE_OTHER): Payer: Managed Care, Other (non HMO) | Admitting: Family Medicine

## 2020-03-21 VITALS — BP 100/68 | HR 74 | Wt 187.0 lb

## 2020-03-21 DIAGNOSIS — F419 Anxiety disorder, unspecified: Secondary | ICD-10-CM | POA: Diagnosis not present

## 2020-03-21 NOTE — Progress Notes (Signed)
Virtual Visit via Video Note  I connected with Austin [name]  on 03/21/20 at 10:00 AM EST by a video enabled telemedicine application and verified that I am speaking with the correct person using two identifiers.  Location patient: home Location provider: [facility]  [address] [place], [place] [address] Persons participating in the virtual visit: patient, provider  I discussed the limitations of evaluation and management by telemedicine and the availability of in person appointments. The patient expressed understanding and agreed to proceed.   Austin [name] DOB: [birth year] Encounter date: 03/21/2020  This is a 55 y.o. male who presents with No chief complaint on file.   History of present illness: Really wants to get away from taking every day medications - he is stuck with so many medications for his heart that he has to take.   Anxiety seems to be doing better - has come to terms with defibrillator and heart health. Usually not even noticing it. Hasn't gone off which he states is also good.   He had more anxiety when he was still figuring out what to do with heart and whether he would need defibrillator. Now just an occasional stress from work that gets to him.   Pharmacy had filled buspar for him and is weaning off this (was accidentally filled by pharmacy) and he is off lexapro.   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 He isn't walking as much as he used to, but some of this is that he is traveling more for work. Just not as much time for walking in evening.    No Known Allergies No outpatient medications have been marked as taking for the 03/21/20 encounter (Appointment) with Wynn Banker, MD.    Review of Systems  Constitutional: Negative for chills, fatigue and fever.  Respiratory: Negative for cough, chest tightness, shortness of breath and wheezing.   Cardiovascular: Negative for chest pain, palpitations and  leg swelling.    Objective:  There were no vitals taken for this visit.      BP Readings from Last 3 Encounters:  11/02/19 125/86  08/31/19 100/80  08/13/19 115/70   Wt Readings from Last 3 Encounters:  11/02/19 187 lb 12.8 oz (85.2 kg)  08/31/19 183 lb 8 oz (83.2 kg)  08/13/19 179 lb 3.2 oz (81.3 kg)    EXAM:  GENERAL: alert, oriented, appears well and in no acute distress  HEENT: atraumatic, conjunctiva clear, no obvious abnormalities on inspection of external nose and ears  NECK: normal movements of the head and neck  LUNGS: on inspection no signs of respiratory distress, breathing rate appears normal, no obvious gross SOB, gasping or wheezing  CV: no obvious cyanosis  MS: moves all visible extremities without noticeable abnormality  PSYCH/NEURO: pleasant and cooperative, no obvious depression or anxiety, speech and thought processing grossly intact. He is much less anxious than last time we talked. He feels better regarding cardiology status at this point.  Assessment/Plan  1. Anxiety He is going to stop the buspar and just will use prn klonopin. He will let me know if anxiety worsening.   follow up for physical in 6 months time.  I discussed the assessment and treatment plan with the patient. The patient was provided an opportunity to ask questions and all were answered. The patient agreed with the plan and demonstrated an understanding of the instructions.   The patient was advised to call back or seek an  in-person evaluation if the symptoms worsen or if the condition fails to improve as anticipated.  I provided 12 minutes of non-face-to-face time during this encounter.   Theodis Shove, MD

## 2020-04-07 ENCOUNTER — Ambulatory Visit (INDEPENDENT_AMBULATORY_CARE_PROVIDER_SITE_OTHER): Payer: Managed Care, Other (non HMO)

## 2020-04-07 DIAGNOSIS — I255 Ischemic cardiomyopathy: Secondary | ICD-10-CM

## 2020-04-08 LAB — CUP PACEART REMOTE DEVICE CHECK
Battery Remaining Longevity: 180 mo
Battery Remaining Percentage: 100 %
Brady Statistic RV Percent Paced: 0 %
Date Time Interrogation Session: 20220217020100
HighPow Impedance: 74 Ohm
Implantable Lead Implant Date: 20210517
Implantable Lead Location: 753860
Implantable Lead Model: 138
Implantable Lead Serial Number: 302699
Implantable Pulse Generator Implant Date: 20210517
Lead Channel Impedance Value: 700 Ohm
Lead Channel Setting Pacing Amplitude: 2.5 V
Lead Channel Setting Pacing Pulse Width: 0.4 ms
Lead Channel Setting Sensing Sensitivity: 0.6 mV
Pulse Gen Serial Number: 209485

## 2020-04-13 NOTE — Progress Notes (Signed)
Remote ICD transmission.   

## 2020-04-19 ENCOUNTER — Other Ambulatory Visit (HOSPITAL_COMMUNITY): Payer: Self-pay

## 2020-04-19 MED ORDER — DAPAGLIFLOZIN PROPANEDIOL 10 MG PO TABS
10.0000 mg | ORAL_TABLET | Freq: Every day | ORAL | 3 refills | Status: DC
Start: 2020-04-19 — End: 2020-08-08

## 2020-04-19 MED ORDER — SPIRONOLACTONE 25 MG PO TABS
25.0000 mg | ORAL_TABLET | Freq: Every day | ORAL | 3 refills | Status: DC
Start: 2020-04-19 — End: 2020-07-19

## 2020-07-07 ENCOUNTER — Ambulatory Visit (INDEPENDENT_AMBULATORY_CARE_PROVIDER_SITE_OTHER): Payer: Managed Care, Other (non HMO)

## 2020-07-07 DIAGNOSIS — I255 Ischemic cardiomyopathy: Secondary | ICD-10-CM | POA: Diagnosis not present

## 2020-07-11 LAB — CUP PACEART REMOTE DEVICE CHECK
Battery Remaining Longevity: 180 mo
Battery Remaining Percentage: 100 %
Brady Statistic RV Percent Paced: 0 %
Date Time Interrogation Session: 20220521103700
HighPow Impedance: 68 Ohm
Implantable Lead Implant Date: 20210517
Implantable Lead Location: 753860
Implantable Lead Model: 138
Implantable Lead Serial Number: 302699
Implantable Pulse Generator Implant Date: 20210517
Lead Channel Impedance Value: 756 Ohm
Lead Channel Setting Pacing Amplitude: 2.5 V
Lead Channel Setting Pacing Pulse Width: 0.4 ms
Lead Channel Setting Sensing Sensitivity: 0.6 mV
Pulse Gen Serial Number: 209485

## 2020-07-16 ENCOUNTER — Other Ambulatory Visit (HOSPITAL_COMMUNITY): Payer: Self-pay | Admitting: Cardiology

## 2020-07-29 NOTE — Progress Notes (Signed)
Remote ICD transmission.   

## 2020-08-02 ENCOUNTER — Encounter: Payer: Self-pay | Admitting: Family Medicine

## 2020-08-03 ENCOUNTER — Telehealth (INDEPENDENT_AMBULATORY_CARE_PROVIDER_SITE_OTHER): Payer: Managed Care, Other (non HMO) | Admitting: Family Medicine

## 2020-08-03 ENCOUNTER — Other Ambulatory Visit: Payer: Self-pay

## 2020-08-03 DIAGNOSIS — U071 COVID-19: Secondary | ICD-10-CM

## 2020-08-03 DIAGNOSIS — R41840 Attention and concentration deficit: Secondary | ICD-10-CM

## 2020-08-03 NOTE — Progress Notes (Signed)
Covid 19 [name], male   DOB: 1965-06-08, 55 y.o.   MRN: [id number]  This visit type was conducted due to national recommendations for restrictions regarding the COVID-19 pandemic in an effort to limit this patient's exposure and mitigate transmission in our community.   Virtual Visit via Video Note  I connected with Austin [name] on 08/03/20 at  4:30 PM EDT by a video enabled telemedicine application and verified that I am speaking with the correct person using two identifiers.  Location patient: home Location provider:work or home office Persons participating in the virtual visit: patient, provider  I discussed the limitations of evaluation and management by telemedicine and the availability of in person appointments. The patient expressed understanding and agreed to proceed.   HPI: Austin [name] has COVID diagnosis.  He states he had onset last Wednesday of some low-grade fever along with body aches.  Positive home COVID test.  No fever after Friday.  Had some initial sore throat and this had improved by Monday.  Only rare cough.  No dyspnea.  He states his major concern has been "head fog ".  He has still been trying to work but has had some difficulties with concentration.  No persistent fever.  He does have history of nonischemic cardiomyopathy, chronic systolic heart failure, past history of Guillain- Barre syndrome.  Overall, feels somewhat improved with the exception of the brain fog that he mentions above.   ROS: See pertinent positives and negatives per HPI.  Past Medical History:  Diagnosis Date   Anxiety    Gastric ulcer    GERD (gastroesophageal reflux disease)    Guillain Barr syndrome (HCC) 1984   COMPLETE RECOVERY; triggered from flu illness   Headache(784.0)    hx of tension-none recent   Hematemesis 09/07/2016   History of kidney stones 09/19/12   left ureteral stone-SURGERY 8/6 URETEROSCOPY / STENT PLACEMENT    Partial gastric outlet  obstruction    UGIB (upper gastrointestinal bleed) 09/07/2016    Past Surgical History:  Procedure Laterality Date   BALLOON DILATION N/A 11/15/2016   Procedure: BALLOON DILATION;  Surgeon: Benancio Deeds, MD;  Location: [facility];  Service: Gastroenterology;  Laterality: N/A;   CORONARY STENT INTERVENTION N/A 01/21/2019   Procedure: CORONARY STENT INTERVENTION;  Surgeon: Kathleene Hazel, MD;  Location: [facility];  Service: Cardiovascular;  Laterality: N/A;   CYSTO, LEFT RGP, DIAGNOSTIC URETEROSCOPY AND STENT PLACEMENT  09/24/2012   CYSTOSCOPY WITH RETROGRADE PYELOGRAM, URETEROSCOPY AND STENT PLACEMENT Left 09/24/2012   Procedure: CYSTOSCOPY WITH RETROGRADE PYELOGRAM, DIAGNOSTIC URETEROSCOPY AND STENT PLACEMENT;  Surgeon: Sebastian Ache, MD;  Location: [facility];  Service: Urology;  Laterality: Left;   CYSTOSCOPY WITH RETROGRADE PYELOGRAM, URETEROSCOPY AND STENT PLACEMENT Left 10/08/2012   Procedure: CYSTOSCOPY WITH RETROGRADE PYELOGRAM, URETEROSCOPY AND STENT PLACEMENT;  Surgeon: Sebastian Ache, MD;  Location: [facility];  Service: Urology;  Laterality: Left;  1 HR NEEDS DIGITAL URETEROSCOPE    ESOPHAGOGASTRODUODENOSCOPY N/A 09/08/2016   Procedure: ESOPHAGOGASTRODUODENOSCOPY (EGD);  Surgeon: Ruffin Frederick, MD;  Location: [facility];  Service: Gastroenterology;  Laterality: N/A;   ESOPHAGOGASTRODUODENOSCOPY N/A 11/15/2016   Procedure: ESOPHAGOGASTRODUODENOSCOPY (EGD);  Surgeon: Benancio Deeds, MD;  Location: [facility];  Service: Gastroenterology;  Laterality: N/A;   ESOPHAGOGASTRODUODENOSCOPY N/A 12/31/2016   Procedure: ESOPHAGOGASTRODUODENOSCOPY (EGD);  Surgeon: Benancio Deeds, MD;  Location: [facility];  Service: Gastroenterology;  Laterality: N/A;   ESOPHAGOGASTRODUODENOSCOPY (EGD) WITH PROPOFOL N/A 10/15/2016   Procedure: ESOPHAGOGASTRODUODENOSCOPY (EGD) WITH PROPOFOL; Duodenal  dilation;  Surgeon: Ruffin Frederick, MD;  Location: [facility] ENDOSCOPY;   Service: Gastroenterology;  Laterality: N/A;   HOLMIUM LASER APPLICATION Left 10/08/2012   Procedure: HOLMIUM LASER APPLICATION;  Surgeon: Sebastian Ache, MD;  Location: [facility];  Service: Urology;  Laterality: Left;   ICD IMPLANT N/A 07/06/2019   Procedure: ICD IMPLANT;  Surgeon: Duke Salvia, MD;  Location: [facility] INVASIVE CV LAB;  Service: Cardiovascular;  Laterality: N/A;   RIGHT/LEFT HEART CATH AND CORONARY ANGIOGRAPHY N/A 01/21/2019   Procedure: RIGHT/LEFT HEART CATH AND CORONARY ANGIOGRAPHY;  Surgeon: Kathleene Hazel, MD;  Location: [facility]AS[facility] CV LAB;  Service: Cardiovascular;  Laterality: N/A;   SAVORY DILATION N/A 12/31/2016   Procedure: POSSIBLE SAVORY DILATION;  Surgeon: Benancio Deeds, MD;  Location: [facility];  Service: Gastroenterology;  Laterality: N/A;   TONSILLECTOMY     t&a as a child   wisdom teeth extracted      Family History  Problem Relation Age of Onset   Heart disease Mother    Congestive Heart Failure Mother    Ulcerative colitis Father    Hemachromatosis Father    Rheum arthritis Father    Heart disease Brother 51       stenting    SOCIAL HX: Non-smoker   Current Outpatient Medications:    acetaminophen (TYLENOL) 500 MG tablet, Take 1,000 mg 2 (two) times daily as needed by mouth for moderate pain., Disp: , Rfl:    atorvastatin (LIPITOR) 80 MG tablet, TAKE 1 TABLET BY MOUTH EVERY DAY, Disp: 90 tablet, Rfl: 3   BRILINTA 90 MG TABS tablet, TAKE 1 TABLET BY MOUTH TWICE A DAY, Disp: 180 tablet, Rfl: 3   carvedilol (COREG) 25 MG tablet, Take 1 tablet (25 mg total) by mouth 2 (two) times daily with a meal., Disp: 60 tablet, Rfl: 5   clonazePAM (KLONOPIN) 0.5 MG tablet, Take 0.5-1 tablets (0.25-0.5 mg total) by mouth daily as needed for anxiety., Disp: 30 tablet, Rfl: 2   dapagliflozin propanediol (FARXIGA) 10 MG TABS tablet, Take 1 tablet (10 mg total) by mouth daily before breakfast. PLEASE MAKE A FOLLOW UP APPOINTMENT, Disp: 30 tablet, Rfl: 3    ENTRESTO 97-103 MG, TAKE 1 TABLET BY MOUTH TWICE A DAY, Disp: 60 tablet, Rfl: 11   MELATONIN PO, Take 1 tablet by mouth at bedtime as needed (sleep)., Disp: , Rfl:    omeprazole (PRILOSEC OTC) 20 MG tablet, Take 20 mg by mouth daily as needed (acid reflux). , Disp: , Rfl:    spironolactone (ALDACTONE) 25 MG tablet, TAKE 1 TABLET (25 MG TOTAL) BY MOUTH DAILY. PLEASE MAKE A FOLLOW UP APPOINTMENT, Disp: 90 tablet, Rfl: 0  EXAM:  VITALS per patient if applicable:  GENERAL: alert, oriented, appears well and in no acute distress  HEENT: atraumatic, conjunttiva clear, no obvious abnormalities on inspection of external nose and ears  NECK: normal movements of the head and neck  LUNGS: on inspection no signs of respiratory distress, breathing rate appears normal, no obvious gross SOB, gasping or wheezing  CV: no obvious cyanosis  MS: moves all visible extremities without noticeable abnormality  PSYCH/NEURO: pleasant and cooperative, no obvious depression or anxiety, speech and thought processing grossly intact  ASSESSMENT AND PLAN:  Discussed the following assessment and plan:  COVID-19 diagnosis.  Patient is roughly on day 7.  We discussed potential antiviral therapy but given the fact these past day 5 would not recommend at this point.  We did discuss monoclonal antibody infusion but  he seems to doing relatively well at this point would observe.  We explained that we cannot give timeline with regard to potential brain fog but hopefully this will improve soon.  Follow-up immediately for any fever or increasing shortness of breath We discussed great variability both of severity of Covid symptoms and time to recovery. -If concern for ongoing cognitive concern in 2-3 weeks consider further neurocognitive testing- minimally [place] Cognitive assessment.      I discussed the assessment and treatment plan with the patient. The patient was provided an opportunity to ask questions and all were  answered. The patient agreed with the plan and demonstrated an understanding of the instructions.   The patient was advised to call back or seek an in-person evaluation if the symptoms worsen or if the condition fails to improve as anticipated.     Evelena Peat, MD

## 2020-08-08 ENCOUNTER — Other Ambulatory Visit (HOSPITAL_COMMUNITY): Payer: Self-pay | Admitting: Cardiology

## 2020-08-15 ENCOUNTER — Other Ambulatory Visit: Payer: Self-pay

## 2020-08-15 ENCOUNTER — Telehealth: Payer: Self-pay

## 2020-08-15 ENCOUNTER — Ambulatory Visit (INDEPENDENT_AMBULATORY_CARE_PROVIDER_SITE_OTHER): Payer: Managed Care, Other (non HMO) | Admitting: Internal Medicine

## 2020-08-15 DIAGNOSIS — I255 Ischemic cardiomyopathy: Secondary | ICD-10-CM

## 2020-08-15 NOTE — Progress Notes (Signed)
Pt in clinic for wound check d/t report of draining sore at ICD site. Per pt report sore has been present for a couple of months, started draining a couple of days ago.  Serous drainage as time requiring dressing change 2-3 times per day.  Pt sent photo of site via mychart (visible in media).  Dr. Graciela Husbands in to observe site.  Per Dr. Graciela Husbands, devioce will need to come out, he has reached out to Dr. Ladona Ridgel.

## 2020-08-15 NOTE — Telephone Encounter (Signed)
Patent device implanted 07/06/19 for ICM, primary prevention.    Pt reports several weeks ago he noticed redness/ discoloration around his ICD implant site.  He thought he had slept on it wrong.  He does note that the red area was sore at first and is now tender.  A couple of days ago the patient ntoed a fluid blister that popped nd has been draining serous fluid since that time.  He has needed to change the bandage over it as often as 2-3 x per day due to saturation.    Pt denies any current fever or chills, but does report a COVID infection a couple of weeks ago.   Pt sent pictures of site which can be seen in mycahrt message.   There is concern for pocket infection.  Adivsed pt should come in for assessment when Dr. Graciela Husbands in office.  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    Appt scheduled for 2:30pm.

## 2020-08-15 NOTE — Telephone Encounter (Signed)
Pt scheduled for device clinic wound check today at 230pm.  Pt is aware.

## 2020-08-25 ENCOUNTER — Other Ambulatory Visit: Payer: Self-pay

## 2020-08-25 ENCOUNTER — Ambulatory Visit (INDEPENDENT_AMBULATORY_CARE_PROVIDER_SITE_OTHER): Payer: Managed Care, Other (non HMO) | Admitting: Internal Medicine

## 2020-08-25 ENCOUNTER — Encounter: Payer: Self-pay | Admitting: Internal Medicine

## 2020-08-25 VITALS — BP 122/88 | HR 86 | Ht 72.0 in | Wt 187.0 lb

## 2020-08-25 DIAGNOSIS — T827XXA Infection and inflammatory reaction due to other cardiac and vascular devices, implants and grafts, initial encounter: Secondary | ICD-10-CM | POA: Diagnosis not present

## 2020-08-25 DIAGNOSIS — I428 Other cardiomyopathies: Secondary | ICD-10-CM

## 2020-08-25 NOTE — H&P (View-Only) (Signed)
HPI Austin [name] is referred today by Dr. Graciela Husbands for evaluation of a draining ICD pocket. He is a pleasant 55 yo man with a h/o chronic systolic heart failure, both an ischemic and non-ischemic CM, severe LV dysfunction but class 2A symptoms. He has been found to have drainage from the bottom of his ICD pocket. He denies fever/chills/night sweats or other systemic symptoms. He notes that the fluid is not purulent, mostly clear, brown tinged and does not smell. His EF has been 25% previously.  No Known Allergies   Current Outpatient Medications  Medication Sig Dispense Refill   acetaminophen (TYLENOL) 500 MG tablet Take 1,000 mg 2 (two) times daily as needed by mouth for moderate pain.     atorvastatin (LIPITOR) 80 MG tablet TAKE 1 TABLET BY MOUTH EVERY DAY 90 tablet 3   BRILINTA 90 MG TABS tablet TAKE 1 TABLET BY MOUTH TWICE A DAY 180 tablet 3   carvedilol (COREG) 25 MG tablet Take 1 tablet (25 mg total) by mouth 2 (two) times daily with a meal. 60 tablet 5   clonazePAM (KLONOPIN) 0.5 MG tablet Take 0.5-1 tablets (0.25-0.5 mg total) by mouth daily as needed for anxiety. 30 tablet 2   ENTRESTO 97-103 MG TAKE 1 TABLET BY MOUTH TWICE A DAY 60 tablet 11   FARXIGA 10 MG TABS tablet TAKE 1 TABLET (10 MG TOTAL) BY MOUTH DAILY BEFORE BREAKFAST. PLEASE MAKE A FOLLOW UP APPOINTMENT 90 tablet 0   MELATONIN PO Take 1 tablet by mouth at bedtime as needed (sleep).     omeprazole (PRILOSEC OTC) 20 MG tablet Take 20 mg by mouth daily as needed (acid reflux).      spironolactone (ALDACTONE) 25 MG tablet TAKE 1 TABLET (25 MG TOTAL) BY MOUTH DAILY. PLEASE MAKE A FOLLOW UP APPOINTMENT 90 tablet 0   No current facility-administered medications for this visit.     Past Medical History:  Diagnosis Date   Anxiety    Gastric ulcer    GERD (gastroesophageal reflux disease)    Guillain Barr syndrome (HCC) 1984   COMPLETE RECOVERY; triggered from flu illness   Headache(784.0)    hx of tension-none recent    Hematemesis 09/07/2016   History of kidney stones 09/19/12   left ureteral stone-SURGERY 8/6 URETEROSCOPY / STENT PLACEMENT    Partial gastric outlet obstruction    UGIB (upper gastrointestinal bleed) 09/07/2016    ROS:   All systems reviewed and negative except as noted in the HPI.   Past Surgical History:  Procedure Laterality Date   BALLOON DILATION N/A 11/15/2016   Procedure: BALLOON DILATION;  Surgeon: Benancio Deeds, MD;  Location: [facility];  Service: Gastroenterology;  Laterality: N/A;   CORONARY STENT INTERVENTION N/A 01/21/2019   Procedure: CORONARY STENT INTERVENTION;  Surgeon: Kathleene Hazel, MD;  Location: [facility];  Service: Cardiovascular;  Laterality: N/A;   CYSTO, LEFT RGP, DIAGNOSTIC URETEROSCOPY AND STENT PLACEMENT  09/24/2012   CYSTOSCOPY WITH RETROGRADE PYELOGRAM, URETEROSCOPY AND STENT PLACEMENT Left 09/24/2012   Procedure: CYSTOSCOPY WITH RETROGRADE PYELOGRAM, DIAGNOSTIC URETEROSCOPY AND STENT PLACEMENT;  Surgeon: Sebastian Ache, MD;  Location: [facility];  Service: Urology;  Laterality: Left;   CYSTOSCOPY WITH RETROGRADE PYELOGRAM, URETEROSCOPY AND STENT PLACEMENT Left 10/08/2012   Procedure: CYSTOSCOPY WITH RETROGRADE PYELOGRAM, URETEROSCOPY AND STENT PLACEMENT;  Surgeon: Sebastian Ache, MD;  Location: [facility];  Service: Urology;  Laterality: Left;  1 HR NEEDS DIGITAL URETEROSCOPE    ESOPHAGOGASTRODUODENOSCOPY N/A 09/08/2016  Procedure: ESOPHAGOGASTRODUODENOSCOPY (EGD);  Surgeon: Ruffin Frederick, MD;  Location: [facility];  Service: Gastroenterology;  Laterality: N/A;   ESOPHAGOGASTRODUODENOSCOPY N/A 11/15/2016   Procedure: ESOPHAGOGASTRODUODENOSCOPY (EGD);  Surgeon: Benancio Deeds, MD;  Location: [facility];  Service: Gastroenterology;  Laterality: N/A;   ESOPHAGOGASTRODUODENOSCOPY N/A 12/31/2016   Procedure: ESOPHAGOGASTRODUODENOSCOPY (EGD);  Surgeon: Benancio Deeds, MD;  Location: [facility];  Service: Gastroenterology;   Laterality: N/A;   ESOPHAGOGASTRODUODENOSCOPY (EGD) WITH PROPOFOL N/A 10/15/2016   Procedure: ESOPHAGOGASTRODUODENOSCOPY (EGD) WITH PROPOFOL; Duodenal dilation;  Surgeon: Ruffin Frederick, MD;  Location: [facility];  Service: Gastroenterology;  Laterality: N/A;   HOLMIUM LASER APPLICATION Left 10/08/2012   Procedure: HOLMIUM LASER APPLICATION;  Surgeon: Sebastian Ache, MD;  Location: [facility];  Service: Urology;  Laterality: Left;   ICD IMPLANT N/A 07/06/2019   Procedure: ICD IMPLANT;  Surgeon: Duke Salvia, MD;  Location: [facility] INVASIVE CV LAB;  Service: Cardiovascular;  Laterality: N/A;   RIGHT/LEFT HEART CATH AND CORONARY ANGIOGRAPHY N/A 01/21/2019   Procedure: RIGHT/LEFT HEART CATH AND CORONARY ANGIOGRAPHY;  Surgeon: Kathleene Hazel, MD;  Location: [facility];  Service: Cardiovascular;  Laterality: N/A;   SAVORY DILATION N/A 12/31/2016   Procedure: POSSIBLE SAVORY DILATION;  Surgeon: Benancio Deeds, MD;  Location: [facility];  Service: Gastroenterology;  Laterality: N/A;   TONSILLECTOMY     t&a as a child   wisdom teeth extracted       Family History  Problem Relation Age of Onset   Heart disease Mother    Congestive Heart Failure Mother    Ulcerative colitis Father    Hemachromatosis Father    Rheum arthritis Father    Heart disease Brother 18       stenting     Social History   Socioeconomic History   Marital status: Married    Spouse name: Not on file   Number of children: Not on file   Years of education: Not on file   Highest education level: Not on file  Occupational History   Not on file  Tobacco Use   Smoking status: Former    Packs/day: 1.50    Years: 28.00    Pack years: 42.00    Types: Cigarettes    Quit date: 05/02/2018    Years since quitting: 2.3   Smokeless tobacco: Never  Vaping Use   Vaping Use: Never used  Substance and Sexual Activity   Alcohol use: Yes    Comment: occasional   Drug use: No    Types: Marijuana     Comment: in the past   Sexual activity: Not on file  Other Topics Concern   Not on file  Social History Narrative   Not on file   Social Determinants of Health   Financial Resource Strain: Not on file  Food Insecurity: Not on file  Transportation Needs: Not on file  Physical Activity: Not on file  Stress: Not on file  Social Connections: Not on file  Intimate Partner Violence: Not on file     BP 122/88   Pulse 86   Ht 6' (1.829 m)   Wt 187 lb (84.8 kg)   SpO2 94%   BMI 25.36 kg/m   Physical Exam:  Well appearing NAD HEENT: Unremarkable Neck:  No JVD, no thyromegally Lymphatics:  No adenopathy Back:  No CVA tenderness Lungs:  Clear with no wheezes; draining sinus at bottom of  HEART:  Regular rate rhythm, no murmurs, no rubs, no clicks Abd:  soft, positive bowel sounds, no organomegally, no rebound, no guarding Ext:  2 plus pulses, no edema, no cyanosis, no clubbing Skin:  No rashes no nodules Neuro:  CN II through XII intact, motor grossly intact  EKG - nsr with non-specific ST wave abnormality.  DEVICE  Normal device function.  See PaceArt for details.   Assess/Plan:  ICD pocket infection - his pocket is contaminated. He is not physically ill. I have recommended that the device be removed. The indications/risks/benefits/goals/expectations of the procedure were reviewed and he wishes to proceed. I cautioned him that should he develop fever/chills/night sweats or systemic symptoms of infection that he should seek immediate medical attention.  Chronic systolic heart failure - he has class 2A symptoms despite severe LV dysfunction. I will decision about re-implant to Dr. Crissie Sickles.  CAD - he is s/p PCI. He denies anginal symptoms.  4. 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   Sharlot Gowda Tiant Peixoto,MD

## 2020-08-25 NOTE — Patient Instructions (Addendum)
Medication Instructions:  Your physician recommends that you continue on your current medications as directed. Please refer to the Current Medication list given to you today.  Labwork: You will get lab work today:  CBC and BMP  Testing/Procedures: None ordered.  Follow-Up:  Plan for procedure on September 12, 2020-arrival time at 12:00 pm to [facility].     Any Other Special Instructions Will Be Listed Below (If Applicable).  If you need a refill on your cardiac medications before your next appointment, please call your pharmacy.

## 2020-08-25 NOTE — Progress Notes (Signed)
HPI [name] is referred today by Dr. Graciela Husbands for evaluation of a draining ICD pocket. He is a pleasant 55 yo man with a h/o chronic systolic heart failure, both an ischemic and non-ischemic CM, severe LV dysfunction but class 2A symptoms. He has been found to have drainage from the bottom of his ICD pocket. He denies fever/chills/night sweats or other systemic symptoms. He notes that the fluid is not purulent, mostly clear, brown tinged and does not smell. His EF has been 25% previously.  No Known Allergies   Current Outpatient Medications  Medication Sig Dispense Refill   acetaminophen (TYLENOL) 500 MG tablet Take 1,000 mg 2 (two) times daily as needed by mouth for moderate pain.     atorvastatin (LIPITOR) 80 MG tablet TAKE 1 TABLET BY MOUTH EVERY DAY 90 tablet 3   BRILINTA 90 MG TABS tablet TAKE 1 TABLET BY MOUTH TWICE A DAY 180 tablet 3   carvedilol (COREG) 25 MG tablet Take 1 tablet (25 mg total) by mouth 2 (two) times daily with a meal. 60 tablet 5   clonazePAM (KLONOPIN) 0.5 MG tablet Take 0.5-1 tablets (0.25-0.5 mg total) by mouth daily as needed for anxiety. 30 tablet 2   ENTRESTO 97-103 MG TAKE 1 TABLET BY MOUTH TWICE A DAY 60 tablet 11   FARXIGA 10 MG TABS tablet TAKE 1 TABLET (10 MG TOTAL) BY MOUTH DAILY BEFORE BREAKFAST. PLEASE MAKE A FOLLOW UP APPOINTMENT 90 tablet 0   MELATONIN PO Take 1 tablet by mouth at bedtime as needed (sleep).     omeprazole (PRILOSEC OTC) 20 MG tablet Take 20 mg by mouth daily as needed (acid reflux).      spironolactone (ALDACTONE) 25 MG tablet TAKE 1 TABLET (25 MG TOTAL) BY MOUTH DAILY. PLEASE MAKE A FOLLOW UP APPOINTMENT 90 tablet 0   No current facility-administered medications for this visit.     Past Medical History:  Diagnosis Date   Anxiety    Gastric ulcer    GERD (gastroesophageal reflux disease)    Guillain Barr syndrome (HCC) 1984   COMPLETE RECOVERY; triggered from flu illness   Headache(784.0)    hx of tension-none recent    Hematemesis 09/07/2016   History of kidney stones 09/19/12   left ureteral stone-SURGERY 8/6 URETEROSCOPY / STENT PLACEMENT    Partial gastric outlet obstruction    UGIB (upper gastrointestinal bleed) 09/07/2016    ROS:   All systems reviewed and negative except as noted in the HPI.   Past Surgical History:  Procedure Laterality Date   BALLOON DILATION N/A 11/15/2016   Procedure: BALLOON DILATION;  Surgeon: Benancio Deeds, MD;  Location: [facility];  Service: Gastroenterology;  Laterality: N/A;   CORONARY STENT INTERVENTION N/A 01/21/2019   Procedure: CORONARY STENT INTERVENTION;  Surgeon: Kathleene Hazel, MD;  Location: [facility];  Service: Cardiovascular;  Laterality: N/A;   CYSTO, LEFT RGP, DIAGNOSTIC URETEROSCOPY AND STENT PLACEMENT  09/24/2012   CYSTOSCOPY WITH RETROGRADE PYELOGRAM, URETEROSCOPY AND STENT PLACEMENT Left 09/24/2012   Procedure: CYSTOSCOPY WITH RETROGRADE PYELOGRAM, DIAGNOSTIC URETEROSCOPY AND STENT PLACEMENT;  Surgeon: Sebastian Ache, MD;  Location: [facility];  Service: Urology;  Laterality: Left;   CYSTOSCOPY WITH RETROGRADE PYELOGRAM, URETEROSCOPY AND STENT PLACEMENT Left 10/08/2012   Procedure: CYSTOSCOPY WITH RETROGRADE PYELOGRAM, URETEROSCOPY AND STENT PLACEMENT;  Surgeon: Sebastian Ache, MD;  Location: [facility];  Service: Urology;  Laterality: Left;  1 HR NEEDS DIGITAL URETEROSCOPE    ESOPHAGOGASTRODUODENOSCOPY N/A 09/08/2016  Procedure: ESOPHAGOGASTRODUODENOSCOPY (EGD);  Surgeon: Ruffin Frederick, MD;  Location: [facility];  Service: Gastroenterology;  Laterality: N/A;   ESOPHAGOGASTRODUODENOSCOPY N/A 11/15/2016   Procedure: ESOPHAGOGASTRODUODENOSCOPY (EGD);  Surgeon: Benancio Deeds, MD;  Location: [facility];  Service: Gastroenterology;  Laterality: N/A;   ESOPHAGOGASTRODUODENOSCOPY N/A 12/31/2016   Procedure: ESOPHAGOGASTRODUODENOSCOPY (EGD);  Surgeon: Benancio Deeds, MD;  Location: [facility];  Service: Gastroenterology;   Laterality: N/A;   ESOPHAGOGASTRODUODENOSCOPY (EGD) WITH PROPOFOL N/A 10/15/2016   Procedure: ESOPHAGOGASTRODUODENOSCOPY (EGD) WITH PROPOFOL; Duodenal dilation;  Surgeon: Ruffin Frederick, MD;  Location: [facility]COPY;  Service: Gastroenterology;  Laterality: N/A;   HOLMIUM LASER APPLICATION Left 10/08/2012   Procedure: HOLMIUM LASER APPLICATION;  Surgeon: Sebastian Ache, MD;  Location: [facility];  Service: Urology;  Laterality: Left;   ICD IMPLANT N/A 07/06/2019   Procedure: ICD IMPLANT;  Surgeon: Duke Salvia, MD;  Location: [facility] INVASIVE CV LAB;  Service: Cardiovascular;  Laterality: N/A;   RIGHT/LEFT HEART CATH AND CORONARY ANGIOGRAPHY N/A 01/21/2019   Procedure: RIGHT/LEFT HEART CATH AND CORONARY ANGIOGRAPHY;  Surgeon: Kathleene Hazel, MD;  Location: [facility];  Service: Cardiovascular;  Laterality: N/A;   SAVORY DILATION N/A 12/31/2016   Procedure: POSSIBLE SAVORY DILATION;  Surgeon: Benancio Deeds, MD;  Location: [facility];  Service: Gastroenterology;  Laterality: N/A;   TONSILLECTOMY     t&a as a child   wisdom teeth extracted       Family History  Problem Relation Age of Onset   Heart disease Mother    Congestive Heart Failure Mother    Ulcerative colitis Father    Hemachromatosis Father    Rheum arthritis Father    Heart disease Brother 18       stenting     Social History   Socioeconomic History   Marital status: Married    Spouse name: Not on file   Number of children: Not on file   Years of education: Not on file   Highest education level: Not on file  Occupational History   Not on file  Tobacco Use   Smoking status: Former    Packs/day: 1.50    Years: 28.00    Pack years: 42.00    Types: Cigarettes    Quit date: 05/02/2018    Years since quitting: 2.3   Smokeless tobacco: Never  Vaping Use   Vaping Use: Never used  Substance and Sexual Activity   Alcohol use: Yes    Comment: occasional   Drug use: No    Types: Marijuana     Comment: in the past   Sexual activity: Not on file  Other Topics Concern   Not on file  Social History Narrative   Not on file   Social Determinants of Health   Financial Resource Strain: Not on file  Food Insecurity: Not on file  Transportation Needs: Not on file  Physical Activity: Not on file  Stress: Not on file  Social Connections: Not on file  Intimate Partner Violence: Not on file     BP 122/88   Pulse 86   Ht 6' (1.829 m)   Wt 187 lb (84.8 kg)   SpO2 94%   BMI 25.36 kg/m   Physical Exam:  Well appearing NAD HEENT: Unremarkable Neck:  No JVD, no thyromegally Lymphatics:  No adenopathy Back:  No CVA tenderness Lungs:  Clear with no wheezes; draining sinus at bottom of  HEART:  Regular rate rhythm, no murmurs, no rubs, no clicks Abd:  soft, positive bowel sounds, no organomegally, no rebound, no guarding Ext:  2 plus pulses, no edema, no cyanosis, no clubbing Skin:  No rashes no nodules Neuro:  CN II through XII intact, motor grossly intact  EKG - nsr with non-specific ST wave abnormality.  DEVICE  Normal device function.  See PaceArt for details.   Assess/Plan:  ICD pocket infection - his pocket is contaminated. He is not physically ill. I have recommended that the device be removed. The indications/risks/benefits/goals/expectations of the procedure were reviewed and he wishes to proceed. I cautioned him that should he develop fever/chills/night sweats or systemic symptoms of infection that he should seek immediate medical attention.  Chronic systolic heart failure - he has class 2A symptoms despite severe LV dysfunction. I will decision about re-implant to Dr. Crissie Sickles.  CAD - he is s/p PCI. He denies anginal symptoms.  4. 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   Sharlot Gowda Alliah Boulanger,MD

## 2020-08-26 ENCOUNTER — Telehealth: Payer: Self-pay

## 2020-08-26 LAB — CBC WITH DIFFERENTIAL/PLATELET
Basophils Absolute: 0.1 10*3/uL (ref 0.0–0.2)
Basos: 1 %
EOS (ABSOLUTE): 0.1 10*3/uL (ref 0.0–0.4)
Eos: 1 %
Hematocrit: 43.4 % (ref 37.5–51.0)
Hemoglobin: 14.8 g/dL (ref 13.0–17.7)
Immature Grans (Abs): 0 10*3/uL (ref 0.0–0.1)
Immature Granulocytes: 0 %
Lymphocytes Absolute: 2.2 10*3/uL (ref 0.7–3.1)
Lymphs: 20 %
MCH: 32.2 pg (ref 26.6–33.0)
MCHC: 34.1 g/dL (ref 31.5–35.7)
MCV: 95 fL (ref 79–97)
Monocytes Absolute: 0.9 10*3/uL (ref 0.1–0.9)
Monocytes: 8 %
Neutrophils Absolute: 7.5 10*3/uL — ABNORMAL HIGH (ref 1.4–7.0)
Neutrophils: 70 %
Platelets: 297 10*3/uL (ref 150–450)
RBC: 4.59 x10E6/uL (ref 4.14–5.80)
RDW: 12.5 % (ref 11.6–15.4)
WBC: 10.7 10*3/uL (ref 3.4–10.8)

## 2020-08-26 LAB — BASIC METABOLIC PANEL
BUN/Creatinine Ratio: 28 — ABNORMAL HIGH (ref 9–20)
BUN: 22 mg/dL (ref 6–24)
CO2: 19 mmol/L — ABNORMAL LOW (ref 20–29)
Calcium: 9.2 mg/dL (ref 8.7–10.2)
Chloride: 107 mmol/L — ABNORMAL HIGH (ref 96–106)
Creatinine, Ser: 0.78 mg/dL (ref 0.76–1.27)
Glucose: 98 mg/dL (ref 65–99)
Potassium: 4.2 mmol/L (ref 3.5–5.2)
Sodium: 143 mmol/L (ref 134–144)
eGFR: 107 mL/min/{1.73_m2} (ref >59)

## 2020-08-29 NOTE — Telephone Encounter (Signed)
Work up complete. 

## 2020-08-29 NOTE — Telephone Encounter (Signed)
Pt scheduled for system extraction on July 25,, 2022 at 3:00 pm  Labs obtained.  Pt advised via mychart.

## 2020-09-01 ENCOUNTER — Encounter: Payer: Self-pay | Admitting: Family Medicine

## 2020-09-02 ENCOUNTER — Other Ambulatory Visit (HOSPITAL_COMMUNITY): Payer: Self-pay | Admitting: Cardiology

## 2020-09-02 ENCOUNTER — Other Ambulatory Visit: Payer: Self-pay | Admitting: Family Medicine

## 2020-09-02 MED ORDER — CLONAZEPAM 0.5 MG PO TABS
0.2500 mg | ORAL_TABLET | Freq: Two times a day (BID) | ORAL | 1 refills | Status: DC
Start: 2020-09-02 — End: 2020-11-18

## 2020-09-09 ENCOUNTER — Telehealth: Payer: Self-pay | Admitting: Internal Medicine

## 2020-09-09 ENCOUNTER — Other Ambulatory Visit: Payer: Self-pay

## 2020-09-09 ENCOUNTER — Encounter (HOSPITAL_COMMUNITY): Payer: Self-pay | Admitting: Internal Medicine

## 2020-09-09 NOTE — Progress Notes (Signed)
Christiane Ha from [employer] called back. Notified that pt surgery has actually been rescheduled for 09/19/20 @ 1340-1740. Christiane Ha has updated their calendar and aware they will be notified if pt surgery date/time has any changes.

## 2020-09-09 NOTE — Anesthesia Preprocedure Evaluation (Addendum)
Anesthesia Evaluation  Patient identified by MRN, date of birth, ID band Patient awake    Reviewed: Allergy & Precautions, NPO status , Patient's Chart, lab work & pertinent test results, reviewed documented beta blocker date and time   Airway Mallampati: II  TM Distance: >3 FB Neck ROM: Full    Dental  (+) Teeth Intact, Dental Advisory Given   Pulmonary former smoker,    breath sounds clear to auscultation       Cardiovascular + angina +CHF  + Cardiac Defibrillator  Rhythm:Regular Rate:Normal     Neuro/Psych  Headaches, PSYCHIATRIC DISORDERS Anxiety    GI/Hepatic Neg liver ROS, PUD, GERD  Medicated,  Endo/Other  negative endocrine ROS  Renal/GU negative Renal ROS     Musculoskeletal negative musculoskeletal ROS (+)   Abdominal Normal abdominal exam  (+)   Peds  Hematology negative hematology ROS (+)   Anesthesia Other Findings   Reproductive/Obstetrics                           Anesthesia Physical Anesthesia Plan  ASA: 4  Anesthesia Plan: General   Post-op Pain Management:    Induction: Intravenous  PONV Risk Score and Plan: 3 and Ondansetron, Midazolam and Dexamethasone  Airway Management Planned: Oral ETT  Additional Equipment: Arterial line and TEE  Intra-op Plan:   Post-operative Plan: Extubation in OR  Informed Consent: I have reviewed the patients History and Physical, chart, labs and discussed the procedure including the risks, benefits and alternatives for the proposed anesthesia with the patient or authorized representative who has indicated his/her understanding and acceptance.     Dental advisory given  Plan Discussed with: CRNA  Anesthesia Plan Comments: (PAT note by Antionette Poles, PA-C: History of chronic systolic CHF and CAD. Patient was in his usual state of health until around 05/2018. He was admitted at that time with acute CHF, had been short of breath  and orthopneic at home. No chest pain. At admission, he was found to be volume overloaded with echo showing EF 20%. He was started on cardiac meds and discharged. Cardiac MRI in 10/20 showed EF 24%, moderately decreased RV systolic function, and full thickness LGE in the basal-mid inferior wall concerning for prior MI. He then had right and left heart cath in 12/20. This showed normal filling pressures and cardiac output. He had DES to mid LCx/OM2 and distal RCA.   Echoin 3/21 showed EF20-25% with diffuse hypokinesis. Mildly decreased RV systolic function. He had [employer] ICD placed in 5/21. Last seen by Dr. Ladona Ridgel 08/25/2020 and noted to have ICD pocket infection.  Noted to have class IIa heart failure symptoms despite severe LV dysfunction.  Removal of device was recommended.  Decision about reimplantation to be made at a later date.  History of Guillain-Barr syndrome, fully recovered.  BMP and CBC from 08/25/2020 reviewed, unremarkable.  EKG 08/25/2020: NSR.  Rate 86.  TTE 05/11/2019: 1. Left ventricular ejection fraction, by estimation, is 20 to 25%. The  left ventricle has severely decreased function. The left ventricle  demonstrates global hypokinesis. The left ventricular internal cavity size  was mildly dilated. There is mild left  ventricular hypertrophy. Left ventricular diastolic parameters are  consistent with Grade I diastolic dysfunction (impaired relaxation).  2. Right ventricular systolic function is mildly reduced. The right  ventricular size is normal. Tricuspid regurgitation signal is inadequate  for assessing PA pressure.  3. The mitral valve is normal in structure. Mild  mitral valve  regurgitation. No evidence of mitral stenosis.  4. The aortic valve is tricuspid. Aortic valve regurgitation is not  visualized. No aortic stenosis is present.  5. The inferior vena cava is normal in size with greater than 50%  respiratory variability, suggesting right  atrial pressure of 3 mmHg.   Cath and PCI 01/21/2019: 1. Severe stenosis mid Circumflex into the second obtuse marginal branch. Successful PTCA/DES x 1 mid Circumflex into OM2. 2. Mild LAD stenosis 3. Severe stenosis distal RCA into PLA. Successful PTCA/DES x 1 distal RCA into PLA 4. Normal filling pressures  Recommendations: ASA and Brilinta for at least six months. Will start Crestor 10 mg daily. Same day PCI discharge if stable.  )     Anesthesia Quick Evaluation

## 2020-09-09 NOTE — Progress Notes (Signed)
PCP - Dr. Hassan Rowan Cardiologist - Dr. Eden Emms EKG - 08/25/20 Chest x-ray -  ECHO -  Cardiac Cath -  CPAP -   Blood Thinner Instructions: pt last dose Brilinta 09/08/20 Aspirin Instructions:   ERAS Protcol - no  COVID TEST- DOS  Anesthesia review: yes  -------------  SDW INSTRUCTIONS:  Your procedure is scheduled on Monday 7/25. Please report to [facility] Main Entrance "A" at 1200 P.M., and check in at the Admitting office. Call this number if you have problems the morning of surgery: [phone]   Remember: Do not eat or drink after midnight the night before your surgery   Medications to take morning of surgery with a sip of water include: Coreg and Entresto  As of today, STOP taking any Aleve, Naproxen, Ibuprofen, Motrin, Advil, Goody's, BC's, all herbal medications, fish oil, and all vitamins.    The Morning of Surgery Do not wear jewelry Do not wear lotions, powders, colognes, or deodorant Men may shave face and neck. Do not bring valuables to the hospital. [facility] is not responsible for any belongings or valuables.  If you are a smoker, DO NOT Smoke 24 hours prior to surgery  If you wear a CPAP at night please bring your mask the morning of surgery   Remember that you must have someone to transport you home after your surgery, and remain with you for 24 hours if you are discharged the same day.  Please bring cases for contacts, glasses, hearing aids, dentures or bridgework because it cannot be worn into surgery.   Patients discharged the day of surgery will not be allowed to drive home.   Please shower the NIGHT BEFORE/MORNING OF SURGERY (use antibacterial soap like DIAL soap if possible). Wear comfortable clothes the morning of surgery. Oral Hygiene is also important to reduce your risk of infection.  Remember - BRUSH YOUR TEETH THE MORNING OF SURGERY WITH YOUR REGULAR TOOTHPASTE  Patient denies shortness of breath, fever, cough and chest pain.

## 2020-09-09 NOTE — Telephone Encounter (Signed)
Per discussion with [facility] precert-[employer] is denying inpatient procedure scheduled for September 12, 2020.  This nurse called [employer]: [phone] opt. [phone]  [phone]  Attempted to schedule peer to peer today-but [employer] refusing.  Stating they have no one available for urgent needs.  [employer] offered peer to peer on Monday.  Advised Jessica-[employer] rep that procedure was Monday and that time was not acceptable.  Advised Jessica-[employer] rep that Pt's procedure would need to be rescheduled and this was putting Pt's health at risk.   Will reschedule Pt procedure.

## 2020-09-09 NOTE — Telephone Encounter (Signed)
[place] from [facility] was calling regarding this patient's upcoming procedure

## 2020-09-09 NOTE — Progress Notes (Signed)
Anesthesia Chart Review: Same day workup  History of chronic systolic CHF and CAD.  Patient was in his usual state of health until around 05/2018.  He was admitted at that time with acute CHF, had been short of breath and orthopneic at home.  No chest pain.  At admission, he was found to be volume overloaded with echo showing EF 20%. He was started on cardiac meds and discharged.  Cardiac MRI in 10/20 showed EF 24%, moderately decreased RV systolic function, and full thickness LGE in the basal-mid inferior wall concerning for prior MI. He then had right and left heart cath in 12/20. This showed normal filling pressures and cardiac output.  He had DES to mid LCx/OM2 and distal RCA.     Echo in 3/21 showed EF 20-25% with diffuse hypokinesis.  Mildly decreased RV systolic function. He had [employer] ICD placed in 5/21.  Last seen by Dr. Ladona Ridgel 08/25/2020 and noted to have ICD pocket infection.  Noted to have class IIa heart failure symptoms despite severe LV dysfunction.  Removal of device was recommended.  Decision about reimplantation to be made at a later date.  History of Guillain-Barr syndrome, fully recovered.  BMP and CBC from 08/25/2020 reviewed, unremarkable.  EKG 08/25/2020: NSR.  Rate 86.  TTE 05/11/2019:  1. Left ventricular ejection fraction, by estimation, is 20 to 25%. The  left ventricle has severely decreased function. The left ventricle  demonstrates global hypokinesis. The left ventricular internal cavity size  was mildly dilated. There is mild left  ventricular hypertrophy. Left ventricular diastolic parameters are  consistent with Grade I diastolic dysfunction (impaired relaxation).   2. Right ventricular systolic function is mildly reduced. The right  ventricular size is normal. Tricuspid regurgitation signal is inadequate  for assessing PA pressure.   3. The mitral valve is normal in structure. Mild mitral valve  regurgitation. No evidence of mitral stenosis.   4. The  aortic valve is tricuspid. Aortic valve regurgitation is not  visualized. No aortic stenosis is present.   5. The inferior vena cava is normal in size with greater than 50%  respiratory variability, suggesting right atrial pressure of 3 mmHg.   Cath and PCI 01/21/2019: 1. Severe stenosis mid Circumflex into the second obtuse marginal branch. Successful PTCA/DES x 1 mid Circumflex into OM2. 2. Mild LAD stenosis 3. Severe stenosis distal RCA into PLA. Successful PTCA/DES x 1 distal RCA into PLA 4. Normal filling pressures   Recommendations: ASA and Brilinta for at least six months. Will start Crestor 10 mg daily. Same day PCI discharge if stable.  Zannie Cove [facility] Short Stay Center/Anesthesiology Phone [phone] 09/09/2020 3:02 PM

## 2020-09-15 ENCOUNTER — Telehealth: Payer: Self-pay | Admitting: Internal Medicine

## 2020-09-15 NOTE — Telephone Encounter (Signed)
Calling to get prior authorization code for procedure patient is have on Monday

## 2020-09-16 ENCOUNTER — Other Ambulatory Visit (HOSPITAL_COMMUNITY): Payer: Managed Care, Other (non HMO)

## 2020-09-16 ENCOUNTER — Other Ambulatory Visit (HOSPITAL_COMMUNITY)
Admission: RE | Admit: 2020-09-16 | Discharge: 2020-09-16 | Disposition: A | Discharge status: Home / Self Care | Payer: Managed Care, Other (non HMO) | Source: Ambulatory Visit | Attending: Internal Medicine | Admitting: Internal Medicine

## 2020-09-16 ENCOUNTER — Encounter (HOSPITAL_COMMUNITY): Payer: Self-pay | Admitting: Internal Medicine

## 2020-09-16 DIAGNOSIS — Z01812 Encounter for preprocedural laboratory examination: Secondary | ICD-10-CM | POA: Diagnosis not present

## 2020-09-16 DIAGNOSIS — Z20822 Contact with and (suspected) exposure to covid-19: Secondary | ICD-10-CM | POA: Diagnosis not present

## 2020-09-16 LAB — SARS CORONAVIRUS 2 (TAT 6-24 HRS): SARS Coronavirus 2: NEGATIVE

## 2020-09-16 NOTE — Telephone Encounter (Signed)
Dr. Ladona Ridgel completed peer to peer.    Will proceed with surgery.  Rosann Auerbach will cover outpatient procedure.  Pt aware.

## 2020-09-16 NOTE — Progress Notes (Addendum)
I see the letter sent to Austin [name] from Dr. Lubertha Basque office, the letter instructs patien to take Entresto and Coreg the morning of  OR. [facility] have instructed staff to have patient's hold ARB's on the morning. In Dr. Lubertha Basque orders there is an order to prepare 2 units of PRBC's for the day of surgery and to keep 2 units ahead.  I do not find an order to for attestation for blood transfusion.I sent Dr. Ladona Ridgel a staff message with information regarding holding Entresto on the morning of surgery and that we need an attestation in order to prepare PRBCs. Dr. Ladona Ridgel sent me a staff message to have patient take Coreg only on Monday am.  The note not did not mention an attestation for PRBCs. On July 22, Austin [name] from [employer] was notified by Tawanna Sat, RN of date and time of surgery.

## 2020-09-19 ENCOUNTER — Inpatient Hospital Stay (HOSPITAL_COMMUNITY): Payer: Managed Care, Other (non HMO)

## 2020-09-19 ENCOUNTER — Inpatient Hospital Stay (HOSPITAL_COMMUNITY): Payer: Managed Care, Other (non HMO) | Admitting: Physician Assistant

## 2020-09-19 ENCOUNTER — Encounter (HOSPITAL_COMMUNITY)
Admission: RE | Disposition: A | Discharge status: Home / Self Care | Payer: Self-pay | Source: Home / Self Care | Attending: Internal Medicine

## 2020-09-19 ENCOUNTER — Encounter (HOSPITAL_COMMUNITY): Payer: Self-pay | Admitting: Internal Medicine

## 2020-09-19 ENCOUNTER — Ambulatory Visit (HOSPITAL_COMMUNITY)
Admission: RE | Admit: 2020-09-19 | Discharge: 2020-09-20 | Disposition: A | Discharge status: Home / Self Care | Payer: Managed Care, Other (non HMO) | Attending: Internal Medicine | Admitting: Internal Medicine

## 2020-09-19 ENCOUNTER — Other Ambulatory Visit: Payer: Self-pay

## 2020-09-19 DIAGNOSIS — Z8711 Personal history of peptic ulcer disease: Secondary | ICD-10-CM | POA: Insufficient documentation

## 2020-09-19 DIAGNOSIS — Y839 Surgical procedure, unspecified as the cause of abnormal reaction of the patient, or of later complication, without mention of misadventure at the time of the procedure: Secondary | ICD-10-CM | POA: Insufficient documentation

## 2020-09-19 DIAGNOSIS — I5022 Chronic systolic (congestive) heart failure: Secondary | ICD-10-CM | POA: Insufficient documentation

## 2020-09-19 DIAGNOSIS — T827XXA Infection and inflammatory reaction due to other cardiac and vascular devices, implants and grafts, initial encounter: Principal | ICD-10-CM | POA: Insufficient documentation

## 2020-09-19 DIAGNOSIS — I2510 Atherosclerotic heart disease of native coronary artery without angina pectoris: Secondary | ICD-10-CM | POA: Insufficient documentation

## 2020-09-19 DIAGNOSIS — Z79899 Other long term (current) drug therapy: Secondary | ICD-10-CM | POA: Diagnosis not present

## 2020-09-19 DIAGNOSIS — Z7984 Long term (current) use of oral hypoglycemic drugs: Secondary | ICD-10-CM | POA: Insufficient documentation

## 2020-09-19 DIAGNOSIS — Z8249 Family history of ischemic heart disease and other diseases of the circulatory system: Secondary | ICD-10-CM | POA: Insufficient documentation

## 2020-09-19 DIAGNOSIS — T827XXD Infection and inflammatory reaction due to other cardiac and vascular devices, implants and grafts, subsequent encounter: Secondary | ICD-10-CM | POA: Diagnosis not present

## 2020-09-19 DIAGNOSIS — Z8669 Personal history of other diseases of the nervous system and sense organs: Secondary | ICD-10-CM | POA: Insufficient documentation

## 2020-09-19 DIAGNOSIS — Z87891 Personal history of nicotine dependence: Secondary | ICD-10-CM | POA: Insufficient documentation

## 2020-09-19 DIAGNOSIS — Z955 Presence of coronary angioplasty implant and graft: Secondary | ICD-10-CM | POA: Insufficient documentation

## 2020-09-19 HISTORY — DX: Heart failure, unspecified: I50.9

## 2020-09-19 HISTORY — DX: Presence of automatic (implantable) cardiac defibrillator: Z95.810

## 2020-09-19 HISTORY — PX: TEE WITHOUT CARDIOVERSION: SHX5443

## 2020-09-19 HISTORY — PX: ICD LEAD REMOVAL: SHX5855

## 2020-09-19 LAB — ECHO INTRAOPERATIVE TEE
Height: 72 in
Weight: 2976 oz

## 2020-09-19 LAB — PREPARE RBC (CROSSMATCH)

## 2020-09-19 SURGERY — REMOVAL, ELECTRODE LEAD, ICD
Anesthesia: General | Site: Chest

## 2020-09-19 MED ORDER — PANTOPRAZOLE SODIUM 20 MG PO TBEC
20.0000 mg | DELAYED_RELEASE_TABLET | Freq: Every day | ORAL | Status: DC
  Administered 2020-09-19 – 2020-09-20 (×2): 20 mg via ORAL
  Filled 2020-09-19 (×2): qty 1

## 2020-09-19 MED ORDER — FENTANYL CITRATE (PF) 100 MCG/2ML IJ SOLN
25.0000 ug | INTRAMUSCULAR | Status: DC | PRN
  Administered 2020-09-19: 25 ug via INTRAVENOUS
  Administered 2020-09-19: 50 ug via INTRAVENOUS
  Administered 2020-09-19: 25 ug via INTRAVENOUS

## 2020-09-19 MED ORDER — ROCURONIUM BROMIDE 10 MG/ML (PF) SYRINGE
PREFILLED_SYRINGE | INTRAVENOUS | Status: DC | PRN
  Administered 2020-09-19: 80 mg via INTRAVENOUS

## 2020-09-19 MED ORDER — CHLORHEXIDINE GLUCONATE 0.12 % MT SOLN
OROMUCOSAL | Status: AC
  Administered 2020-09-19: 15 mL via OROMUCOSAL
  Filled 2020-09-19: qty 15

## 2020-09-19 MED ORDER — HEPARIN 6000 UNIT IRRIGATION SOLUTION
Status: DC | PRN
  Administered 2020-09-19: 1

## 2020-09-19 MED ORDER — SACUBITRIL-VALSARTAN 97-103 MG PO TABS
1.0000 | ORAL_TABLET | Freq: Two times a day (BID) | ORAL | Status: DC
  Administered 2020-09-19 – 2020-09-20 (×2): 1 via ORAL
  Filled 2020-09-19 (×3): qty 1

## 2020-09-19 MED ORDER — DAPAGLIFLOZIN PROPANEDIOL 10 MG PO TABS
10.0000 mg | ORAL_TABLET | Freq: Every day | ORAL | Status: DC
  Administered 2020-09-20: 10 mg via ORAL
  Filled 2020-09-19: qty 1

## 2020-09-19 MED ORDER — ONDANSETRON HCL 4 MG/2ML IJ SOLN
INTRAMUSCULAR | Status: DC | PRN
  Administered 2020-09-19: 4 mg via INTRAVENOUS

## 2020-09-19 MED ORDER — SODIUM CHLORIDE 0.9 % IV SOLN
INTRAVENOUS | Status: AC
  Filled 2020-09-19: qty 2

## 2020-09-19 MED ORDER — CEFAZOLIN SODIUM-DEXTROSE 2-4 GM/100ML-% IV SOLN
INTRAVENOUS | Status: AC
  Filled 2020-09-19: qty 100

## 2020-09-19 MED ORDER — ACETAMINOPHEN 10 MG/ML IV SOLN: 1000.0000 mg | Freq: Once | INTRAVENOUS | Status: DC | PRN

## 2020-09-19 MED ORDER — ACETAMINOPHEN 325 MG PO TABS: 325.0000 mg | ORAL_TABLET | ORAL | Status: DC | PRN

## 2020-09-19 MED ORDER — GENTAMICIN IN SALINE 1.6-0.9 MG/ML-% IV SOLN
INTRAVENOUS | Status: AC
  Filled 2020-09-19: qty 50

## 2020-09-19 MED ORDER — CARVEDILOL 25 MG PO TABS
25.0000 mg | ORAL_TABLET | Freq: Two times a day (BID) | ORAL | Status: DC
  Administered 2020-09-20: 25 mg via ORAL
  Filled 2020-09-19: qty 1

## 2020-09-19 MED ORDER — SODIUM CHLORIDE 0.9 % IV SOLN
80.0000 mg | INTRAVENOUS | Status: DC
  Filled 2020-09-19: qty 2

## 2020-09-19 MED ORDER — FENTANYL CITRATE (PF) 100 MCG/2ML IJ SOLN
INTRAMUSCULAR | Status: AC
  Filled 2020-09-19: qty 2

## 2020-09-19 MED ORDER — SUGAMMADEX SODIUM 200 MG/2ML IV SOLN
INTRAVENOUS | Status: DC | PRN
  Administered 2020-09-19: 300 mg via INTRAVENOUS

## 2020-09-19 MED ORDER — ACETAMINOPHEN 500 MG PO TABS: 1000.0000 mg | ORAL_TABLET | Freq: Two times a day (BID) | ORAL | Status: DC | PRN

## 2020-09-19 MED ORDER — PROMETHAZINE HCL 25 MG/ML IJ SOLN: 6.2500 mg | INTRAMUSCULAR | Status: DC | PRN

## 2020-09-19 MED ORDER — CEFAZOLIN SODIUM-DEXTROSE 1-4 GM/50ML-% IV SOLN
1.0000 g | Freq: Four times a day (QID) | INTRAVENOUS | Status: AC
  Administered 2020-09-19 – 2020-09-20 (×3): 1 g via INTRAVENOUS
  Filled 2020-09-19 (×3): qty 50

## 2020-09-19 MED ORDER — CHLORHEXIDINE GLUCONATE 0.12 % MT SOLN: 15.0000 mL | Freq: Once | OROMUCOSAL | Status: AC

## 2020-09-19 MED ORDER — ORAL CARE MOUTH RINSE: 15.0000 mL | Freq: Once | OROMUCOSAL | Status: AC

## 2020-09-19 MED ORDER — DEXAMETHASONE SODIUM PHOSPHATE 10 MG/ML IJ SOLN
INTRAMUSCULAR | Status: DC | PRN
  Administered 2020-09-19: 5 mg via INTRAVENOUS

## 2020-09-19 MED ORDER — SPIRONOLACTONE 25 MG PO TABS
25.0000 mg | ORAL_TABLET | Freq: Every day | ORAL | Status: DC
  Administered 2020-09-19 – 2020-09-20 (×2): 25 mg via ORAL
  Filled 2020-09-19 (×2): qty 1

## 2020-09-19 MED ORDER — AMISULPRIDE (ANTIEMETIC) 5 MG/2ML IV SOLN: 10.0000 mg | Freq: Once | INTRAVENOUS | Status: DC | PRN

## 2020-09-19 MED ORDER — ACETAMINOPHEN 325 MG PO TABS
325.0000 mg | ORAL_TABLET | ORAL | Status: DC | PRN
  Administered 2020-09-19 – 2020-09-20 (×2): 650 mg via ORAL
  Filled 2020-09-19 (×2): qty 2

## 2020-09-19 MED ORDER — PHENYLEPHRINE HCL (PRESSORS) 10 MG/ML IV SOLN
INTRAVENOUS | Status: DC | PRN
  Administered 2020-09-19: 100 ug via INTRAVENOUS

## 2020-09-19 MED ORDER — OXYCODONE HCL 5 MG PO TABS
ORAL_TABLET | ORAL | Status: AC
  Filled 2020-09-19: qty 1

## 2020-09-19 MED ORDER — ACETAMINOPHEN 160 MG/5ML PO SOLN: 325.0000 mg | ORAL | Status: DC | PRN

## 2020-09-19 MED ORDER — SODIUM CHLORIDE 0.9 % IV SOLN: INTRAVENOUS | Status: DC | PRN

## 2020-09-19 MED ORDER — OXYCODONE HCL 5 MG/5ML PO SOLN: 5.0000 mg | Freq: Once | ORAL | Status: AC | PRN

## 2020-09-19 MED ORDER — POVIDONE-IODINE 10 % EX SWAB
2.0000 "application " | Freq: Once | CUTANEOUS | Status: AC
  Administered 2020-09-19: 2 via TOPICAL

## 2020-09-19 MED ORDER — SODIUM CHLORIDE 0.9 % IV SOLN: INTRAVENOUS | Status: DC

## 2020-09-19 MED ORDER — OXYCODONE HCL 5 MG PO TABS
5.0000 mg | ORAL_TABLET | Freq: Once | ORAL | Status: AC | PRN
  Administered 2020-09-19: 5 mg via ORAL

## 2020-09-19 MED ORDER — ONDANSETRON HCL 4 MG/2ML IJ SOLN
4.0000 mg | Freq: Four times a day (QID) | INTRAMUSCULAR | Status: DC | PRN
Start: 2020-09-19 — End: 2020-09-20

## 2020-09-19 MED ORDER — ADULT MULTIVITAMIN W/MINERALS CH: 1.0000 | ORAL_TABLET | ORAL | Status: DC

## 2020-09-19 MED ORDER — GENTAMICIN SULFATE 40 MG/ML IJ SOLN
INTRAVENOUS | Status: DC | PRN
  Administered 2020-09-19: 80 mg via INTRAVENOUS

## 2020-09-19 MED ORDER — MIDAZOLAM HCL 2 MG/2ML IJ SOLN
INTRAMUSCULAR | Status: AC
  Filled 2020-09-19: qty 2

## 2020-09-19 MED ORDER — CEFAZOLIN SODIUM-DEXTROSE 2-4 GM/100ML-% IV SOLN
2.0000 g | INTRAVENOUS | Status: AC
  Administered 2020-09-19: 2 g via INTRAVENOUS

## 2020-09-19 MED ORDER — CLONAZEPAM 0.25 MG PO TBDP
0.2500 mg | ORAL_TABLET | Freq: Every day | ORAL | Status: DC | PRN
  Administered 2020-09-19: 0.5 mg via ORAL
  Administered 2020-09-20: 0.25 mg via ORAL
  Filled 2020-09-19: qty 2
  Filled 2020-09-19: qty 1

## 2020-09-19 MED ORDER — FENTANYL CITRATE (PF) 100 MCG/2ML IJ SOLN
INTRAMUSCULAR | Status: DC | PRN
  Administered 2020-09-19: 100 ug via INTRAVENOUS
  Administered 2020-09-19: 50 ug via INTRAVENOUS
  Administered 2020-09-19: 100 ug via INTRAVENOUS

## 2020-09-19 MED ORDER — ATORVASTATIN CALCIUM 80 MG PO TABS
80.0000 mg | ORAL_TABLET | Freq: Every day | ORAL | Status: DC
  Administered 2020-09-19 – 2020-09-20 (×2): 80 mg via ORAL
  Filled 2020-09-19 (×2): qty 1

## 2020-09-19 MED ORDER — MIDAZOLAM HCL 5 MG/5ML IJ SOLN
INTRAMUSCULAR | Status: DC | PRN
  Administered 2020-09-19: 2 mg via INTRAVENOUS

## 2020-09-19 MED ORDER — SODIUM CHLORIDE 0.9% IV SOLUTION: Freq: Once | INTRAVENOUS | Status: DC

## 2020-09-19 MED ORDER — SODIUM CHLORIDE 0.9 % IV SOLN
INTRAVENOUS | Status: AC
  Filled 2020-09-19 (×2): qty 2

## 2020-09-19 MED ORDER — ETOMIDATE 2 MG/ML IV SOLN
INTRAVENOUS | Status: DC | PRN
  Administered 2020-09-19: 15 mg via INTRAVENOUS

## 2020-09-19 SURGICAL SUPPLY — 49 items
BAG BANDED W/RUBBER/TAPE 36X54 (MISCELLANEOUS) IMPLANT
BAG COUNTER SPONGE SURGICOUNT (BAG) ×3 IMPLANT
BAG DECANTER FOR FLEXI CONT (MISCELLANEOUS) IMPLANT
BLADE CLIPPER SURG (BLADE) IMPLANT
BLADE OSCILLATING /SAGITTAL (BLADE) IMPLANT
BLADE STERNUM SYSTEM 6 (BLADE) IMPLANT
BNDG COHESIVE 4X5 WHT NS (GAUZE/BANDAGES/DRESSINGS) ×3 IMPLANT
CANISTER SUCT 3000ML PPV (MISCELLANEOUS) ×3 IMPLANT
COIL ONE TIE COMPRESSION (VASCULAR PRODUCTS) ×3 IMPLANT
COVER BACK TABLE 60X90IN (DRAPES) ×3 IMPLANT
DERMABOND ADVANCED (GAUZE/BANDAGES/DRESSINGS) ×2
DERMABOND ADVANCED .7 DNX12 (GAUZE/BANDAGES/DRESSINGS) ×4 IMPLANT
DRAPE C-ARM 42X72 X-RAY (DRAPES) IMPLANT
DRAPE CARDIOVASCULAR INCISE (DRAPES) ×1
DRAPE HALF SHEET 40X57 (DRAPES) ×6 IMPLANT
DRAPE INCISE IOBAN 66X45 STRL (DRAPES) IMPLANT
DRAPE SRG 135X102X78XABS (DRAPES) ×2 IMPLANT
DRSG TEGADERM 4X4.75 (GAUZE/BANDAGES/DRESSINGS) ×3 IMPLANT
ELECT REM PT RETURN 9FT ADLT (ELECTROSURGICAL) ×6
ELECTRODE REM PT RTRN 9FT ADLT (ELECTROSURGICAL) ×4 IMPLANT
FELT TEFLON 1X6 (MISCELLANEOUS) IMPLANT
GAUZE 4X4 16PLY ~~LOC~~+RFID DBL (SPONGE) IMPLANT
GAUZE SPONGE 2X2 8PLY NS (GAUZE/BANDAGES/DRESSINGS) ×3 IMPLANT
GAUZE SPONGE 4X4 12PLY STRL (GAUZE/BANDAGES/DRESSINGS) ×3 IMPLANT
GLOVE SURG LTX SZ8 (GLOVE) ×3 IMPLANT
GLOVE SURG UNDER POLY LF SZ7.5 (GLOVE) ×3 IMPLANT
GOWN STRL REUS W/ TWL LRG LVL3 (GOWN DISPOSABLE) ×2 IMPLANT
GOWN STRL REUS W/ TWL XL LVL3 (GOWN DISPOSABLE) ×2 IMPLANT
GOWN STRL REUS W/TWL LRG LVL3 (GOWN DISPOSABLE) ×1
GOWN STRL REUS W/TWL XL LVL3 (GOWN DISPOSABLE) ×1
KIT TURNOVER KIT B (KITS) ×3 IMPLANT
NEEDLE PERC 18GX7CM (NEEDLE) ×3 IMPLANT
NS IRRIG 1000ML POUR BTL (IV SOLUTION) IMPLANT
PAD ARMBOARD 7.5X6 YLW CONV (MISCELLANEOUS) ×6 IMPLANT
PAD ELECT DEFIB RADIOL ZOLL (MISCELLANEOUS) ×3 IMPLANT
SHEATH EVOLUTION RL 11F (SHEATH) ×3 IMPLANT
SHEATH EVOLUTION SHORTE RL 11F (SHEATH) ×3 IMPLANT
SHEATH PINNACLE 6F 10CM (SHEATH) ×3 IMPLANT
STYLET LIBERATOR LOCKING (MISCELLANEOUS) ×3 IMPLANT
SUT PROLENE 2 0 CT2 30 (SUTURE) IMPLANT
SUT PROLENE 2 0 SH DA (SUTURE) ×3 IMPLANT
SUT VIC AB 2-0 CT2 18 VCP726D (SUTURE) IMPLANT
SUT VIC AB 3-0 X1 27 (SUTURE) IMPLANT
TAPE CLOTH SURG 4X10 WHT LF (GAUZE/BANDAGES/DRESSINGS) ×3 IMPLANT
TOWEL GREEN STERILE (TOWEL DISPOSABLE) ×6 IMPLANT
TOWEL GREEN STERILE FF (TOWEL DISPOSABLE) ×6 IMPLANT
TRAY FOLEY MTR SLVR 16FR STAT (SET/KITS/TRAYS/PACK) ×3 IMPLANT
TUBE CONNECTING 12X1/4 (SUCTIONS) IMPLANT
YANKAUER SUCT BULB TIP NO VENT (SUCTIONS) IMPLANT

## 2020-09-19 NOTE — Transfer of Care (Signed)
Immediate Anesthesia Transfer of Care Note  Patient: Austin [name]  Procedure(s) Performed: ICD SYSTEM EXTRACTION (Chest) TRANSESOPHAGEAL ECHOCARDIOGRAM (TEE)  Patient Location: PACU  Anesthesia Type:General  Level of Consciousness: awake, alert  and oriented  Airway & Oxygen Therapy: Patient Spontanous Breathing  Post-op Assessment: Report given to RN and Post -op Vital signs reviewed and stable  Post vital signs: Reviewed and stable  Last Vitals:  Vitals Value Taken Time  BP 117/83 09/19/20 1527  Temp    Pulse 85 09/19/20 1533  Resp 9 09/19/20 1533  SpO2 90 % 09/19/20 1533  Vitals shown include unvalidated device data.  Last Pain:  Vitals:   09/19/20 1201  TempSrc:   PainSc: 0-No pain         Complications: No notable events documented.

## 2020-09-19 NOTE — Progress Notes (Signed)
Pt arrived from PACU.  VSS, chg bath, bedrest till 19:30.  Pt oriented to unit and given menu and phone to place dinner order. White board updated with plan of care. Thomas Hoff, RN

## 2020-09-19 NOTE — Interval H&P Note (Signed)
History and Physical Interval Note:  09/19/2020 1:11 PM  Austin [name]  has presented today for surgery, with the diagnosis of SYSTEM LEAD INFECTION.  The various methods of treatment have been discussed with the patient and family. After consideration of risks, benefits and other options for treatment, the patient has consented to  Procedure(s): ICD SYSTEM EXTRACTION (N/A) as a surgical intervention.  The patient's history has been reviewed, patient examined, no change in status, stable for surgery.  I have reviewed the patient's chart and labs.  Questions were answered to the patient's satisfaction.     Lewayne Bunting

## 2020-09-19 NOTE — Progress Notes (Signed)
EP Attending  I discussed the patients admission status with Dr. Lenn Sink, MD for [facility]. Austin [name] is only willing to approve this procedure as an outpatient, assuming he is able to be discharged in 23 hours which is likely the case. If the procedure is complicated requiring a longer stay, Dr. Lenn Sink assures me that an inpatient status will be approved.   Austin Gowda [name],MD

## 2020-09-19 NOTE — Progress Notes (Signed)
  Echocardiogram Echocardiogram Transesophageal has been performed.  Augustine Radar 09/19/2020, 2:17 PM

## 2020-09-19 NOTE — CV Procedure (Signed)
EP procedure note  Procedure performed: Extraction of an infected single-chamber ICD  Preoperative diagnosis: Chronic systolic heart failure status post ICD insertion with the current device developing a draining fistula from the base of the patient's pocket  Postoperative diagnosis: Same as preoperative diagnosis  Description of the procedure: After informed consent was obtained, the patient was taken to the operating room in the fasting state.  The anesthesia service was utilized to provide general endotracheal anesthesia as well as invasive arterial hemodynamic monitoring with a catheter in the right radial artery.  A TEE was placed to monitor the patient's cardiac chambers and pericardium.  After the usual preparation draping, a 7 French sheath was inserted percutaneously in the right femoral vein for large-bore venous access.  Attention was then turned to ICD extraction.  A 6 cm incision was carried out.  A combination of blunt and sharp dissection was utilized to dissect down to the ICD generator.  Generator was removed with gentle traction.  The lead was disconnected from the defibrillator.  The lead was freed up of its fibrous scar tissue with blunt dissection.  A stylette was inserted into the body of the lead and the helix was retracted.  The lead was cut.  A Cook liberator locking stylette was inserted into the body of the lead and locked in place.  The Laurel 1 tie proximal suture was attached to the lead at the locking stylette junction.  The [facility] [address] [place] short RL dissection sheath was advanced into the subclavian vein and then onto the innominate vein.  The short dissection sheath was removed and the [facility] [address] [place] long RL dissection sheath was advanced over the lead and down into the SVC.  A combination of traction and countertraction, pressure and counterpressure was applied to the lead with the extraction sheath.  The sheath was advanced down to the distal portion of the shocking coil and  the lead remained fixed in place.  Gentle traction was placed on the lead and gentle pressure was placed on the sheath and the lead was removed in total.  There is no hemodynamic sequelae.  Hemostasis was achieved with pressure.  The pocket was irrigated with antibiotic irrigation.  The incision was closed with multiple Prolene mattress sutures.  At the site of the draining fistula a 3 cm ellipse incision was carried out and the incision was closed with Prolene mattress suture.  The pocket was irrigated with antibiotic irrigation.  A pressure dressing was placed.  The patient was returned to the recovery area after removal of his sheath.  Complications: There were no immediate procedure complications  Conclusion: Successful extraction of a [employer] single-chamber ICD in a patient with a draining fistula at the base of his ICD pocket.  Lewayne Bunting, MD

## 2020-09-20 ENCOUNTER — Ambulatory Visit (HOSPITAL_COMMUNITY): Payer: Managed Care, Other (non HMO)

## 2020-09-20 ENCOUNTER — Encounter (HOSPITAL_COMMUNITY): Payer: Self-pay | Admitting: Internal Medicine

## 2020-09-20 DIAGNOSIS — T827XXA Infection and inflammatory reaction due to other cardiac and vascular devices, implants and grafts, initial encounter: Secondary | ICD-10-CM | POA: Diagnosis not present

## 2020-09-20 NOTE — Plan of Care (Signed)

## 2020-09-20 NOTE — Progress Notes (Signed)
Patient given discharge instructions but waiting for his vest to be fitted before he can leave.

## 2020-09-20 NOTE — Discharge Summary (Addendum)
ELECTROPHYSIOLOGY PROCEDURE DISCHARGE SUMMARY    [name],  MRN: [id number], DOB/AGE: Jul 22, 1965 55 y.o.  Admit date: 09/19/2020 Discharge date: 09/20/2020  Primary Care Physician:Koberlein, Paris Lore, MD  Primary Cardiologist: Dr. Eden Emms AHF: Dr. Shirlee Latch Electrophysiologist: Dr. Graciela Husbands  Primary Discharge Diagnosis:  ICD pocket infection  Secondary Discharge Diagnosis:  CAD DES to Cx and RCA Dec 2020 2. ICM 3. Chronic CHF (systolic) Compensated currently 4. Hx of Guillain Barre syndrome  No Known Allergies   Procedures This Admission:  1.  ICD system extraction 09/19/20 Dr. Ladona Ridgel See procedure report for full details  Brief HPI: Austin [name] is a 55 y.o. male with above PMHx was referred to Dr. Ladona Ridgel by Dr. Graciela Husbands for ICD pocket drainage and consideration for ICD extraction  Hospital Course:  The patient had no systemic symptoms of illness, was planned to undergo removal of his device system.  He was admitted and underwent procedure as planned.  He was monitored on telemetry overnight which demonstrated SR.  Left chest was without hematoma or ecchymosis.  Sutures are in place, no bleeding or drainage, slightly swollen laterally, though no hematoma.   CXR was obtained and demonstrated no pneumothorax status post device implantation.  Wound care, arm mobility, and restrictions were reviewed with the patient.  TEE images were reviewed by Dr. Lalla Brothers, EF likely remains about 30%.  Discussed given age, scar on his MRI and persistent reduction in LVEF that he wear a life vest until follow up with Dr. Graciela Husbands and decision on re-implant are made. The patient is reluctant but agreeable for the vest, he feels well, denies any CP or SOB, minimal site discomfort, he was examined by Dr. Lalla Brothers and considered stable for discharge to home.   To resume his Brilinta on 09/23/20  Life vest has been ordered and industry rep is aware.  Physical Exam: Vitals:    09/20/20 0355 09/20/20 0400 09/20/20 0639 09/20/20 0753  BP: 112/87   111/82  Pulse: 87   92  Resp: 16 20  15   Temp: 98 F (36.7 C)   98.2 F (36.8 C)  TempSrc: Oral   Oral  SpO2: 92%   93%  Weight:   85 kg   Height:        GEN- The patient is well appearing, alert and oriented x 3 today.   HEENT: normocephalic, atraumatic; sclera clear, conjunctiva pink; hearing intact; oropharynx clear Lungs- CTA b/l, normal work of breathing.  No wheezes, rales, rhonchi Heart- RRR, no murmurs, rubs or gallops, PMI not laterally displaced GI- soft, non-tender, non-distended Extremities- no clubbing, cyanosis, or edema MS- no significant deformity or atrophy Skin- warm and dry, no rash or lesion, left chest, sutures are in place they are clean and dry, no hematoma/ecchymosis Psych- euthymic mood, full affect Neuro- no gross defecits  Labs:   Lab Results  Component Value Date   WBC 10.7 08/25/2020   HGB 14.8 08/25/2020   HCT 43.4 08/25/2020   MCV 95 08/25/2020   PLT 297 08/25/2020   No results for input(s): NA, K, CL, CO2, BUN, CREATININE, CALCIUM, PROT, BILITOT, ALKPHOS, ALT, AST, GLUCOSE in the last 168 hours.  Invalid input(s): LABALBU  Discharge Medications:  Allergies as of 09/20/2020   No Known Allergies      Medication List     TAKE these medications    acetaminophen 500 MG tablet Commonly known as: TYLENOL Take 1,000 mg 2 (two) times daily as needed by mouth  for moderate pain.   atorvastatin 80 MG tablet Commonly known as: LIPITOR TAKE 1 TABLET BY MOUTH EVERY DAY   Brilinta 90 MG Tabs tablet Generic drug: ticagrelor TAKE 1 TABLET BY MOUTH TWICE A DAY What changed: how much to take Notes to patient: Do not resume until Friday 09/23/20   carvedilol 25 MG tablet Commonly known as: COREG TAKE 1 TABLET (25 MG TOTAL) BY MOUTH 2 (TWO) TIMES DAILY WITH A MEAL.   clonazePAM 0.5 MG tablet Commonly known as: KLONOPIN Take 0.5-1 tablets (0.25-0.5 mg total) by mouth 2 (two)  times daily. What changed:  when to take this reasons to take this   Entresto 97-103 MG Generic drug: sacubitril-valsartan TAKE 1 TABLET BY MOUTH TWICE A DAY   Farxiga 10 MG Tabs tablet Generic drug: dapagliflozin propanediol TAKE 1 TABLET (10 MG TOTAL) BY MOUTH DAILY BEFORE BREAKFAST. PLEASE MAKE A FOLLOW UP APPOINTMENT What changed: See the new instructions.   MELATONIN PO Take 1 tablet by mouth at bedtime as needed (sleep).   multivitamin with minerals tablet Take 1 tablet by mouth 3 (three) times a week.   omeprazole 20 MG tablet Commonly known as: PRILOSEC OTC Take 20 mg by mouth daily.   spironolactone 25 MG tablet Commonly known as: ALDACTONE TAKE 1 TABLET (25 MG TOTAL) BY MOUTH DAILY. PLEASE MAKE A FOLLOW UP APPOINTMENT What changed: additional instructions               Durable Medical Equipment  (From admission, onward)           Start     Ordered   09/19/20 1605  For home use only DME Vest life vest  Once       Comments: S/p ICD extraction 2/2 pocket infection   09/19/20 1605              Discharge Care Instructions  (From admission, onward)           Start     Ordered   09/20/20 0000  Discharge wound care:       Comments: As noted in AVS   09/20/20 0915            Disposition: Home Discharge Instructions     Diet - low sodium heart healthy   Complete by: As directed    Discharge wound care:   Complete by: As directed    As noted in AVS   Increase activity slowly   Complete by: As directed        Follow-up Information     [facility] Follow up.   Specialty: Cardiology Why: 09/29/20 @ 10:00AM, wound check visit Contact information: [address], [address] [place] [address] [phone]        Duke Salvia, MD .   Specialty: Cardiology Why: you will be called by Dr. Odessa Fleming scheduler to make a 4 week follow up visit Contact information: [address] [place] [address] [phone]                 Duration of Discharge Encounter: Greater than 30 minutes including physician time.  Norma Fredrickson, PA-C 09/20/2020 9:18 AM

## 2020-09-20 NOTE — Discharge Instructions (Signed)
Post procedure wound care instructions Keep incision clean and dry, no showers until cleared to at your wound check visit No driving for 2 days.  Light/loose bandage/cover is OK on sites, no lotions, ointments or salves Call the office ([phone]) for redness, drainage, swelling, or fever.

## 2020-09-20 NOTE — Plan of Care (Signed)
  Problem: Education: Goal: Knowledge of General Education information will improve Description: Including pain rating scale, medication(s)/side effects and non-pharmacologic comfort measures 09/20/2020 1119 by Dallas Breeding, RN Outcome: Adequate for Discharge 09/20/2020 1119 by Dallas Breeding, RN Outcome: Progressing   Problem: Health Behavior/Discharge Planning: Goal: Ability to manage health-related needs will improve 09/20/2020 1119 by Dallas Breeding, RN Outcome: Adequate for Discharge 09/20/2020 1119 by Dallas Breeding, RN Outcome: Progressing   Problem: Clinical Measurements: Goal: Ability to maintain clinical measurements within normal limits will improve Outcome: Adequate for Discharge Goal: Will remain free from infection Outcome: Adequate for Discharge Goal: Diagnostic test results will improve Outcome: Adequate for Discharge Goal: Respiratory complications will improve Outcome: Adequate for Discharge Goal: Cardiovascular complication will be avoided Outcome: Adequate for Discharge   Problem: Activity: Goal: Risk for activity intolerance will decrease 09/20/2020 1119 by Dallas Breeding, RN Outcome: Adequate for Discharge 09/20/2020 1119 by Dallas Breeding, RN Outcome: Progressing   Problem: Nutrition: Goal: Adequate nutrition will be maintained 09/20/2020 1119 by Dallas Breeding, RN Outcome: Adequate for Discharge 09/20/2020 1119 by Dallas Breeding, RN Outcome: Progressing   Problem: Coping: Goal: Level of anxiety will decrease Outcome: Adequate for Discharge   Problem: Elimination: Goal: Will not experience complications related to bowel motility Outcome: Adequate for Discharge Goal: Will not experience complications related to urinary retention Outcome: Adequate for Discharge   Problem: Pain Managment: Goal: General experience of comfort will improve 09/20/2020 1119 by Dallas Breeding, RN Outcome:  Adequate for Discharge 09/20/2020 1119 by Dallas Breeding, RN Outcome: Progressing   Problem: Safety: Goal: Ability to remain free from injury will improve Outcome: Adequate for Discharge   Problem: Skin Integrity: Goal: Risk for impaired skin integrity will decrease Outcome: Adequate for Discharge

## 2020-09-20 NOTE — Anesthesia Postprocedure Evaluation (Signed)
Anesthesia Post Note  Patient: Austin [name]  Procedure(s) Performed: ICD SYSTEM EXTRACTION (Chest) TRANSESOPHAGEAL ECHOCARDIOGRAM (TEE)     Patient location during evaluation: PACU Anesthesia Type: General Level of consciousness: awake and alert Pain management: pain level controlled Vital Signs Assessment: post-procedure vital signs reviewed and stable Respiratory status: spontaneous breathing, nonlabored ventilation, respiratory function stable and patient connected to nasal cannula oxygen Cardiovascular status: blood pressure returned to baseline and stable Postop Assessment: no apparent nausea or vomiting Anesthetic complications: no   No notable events documented.  Last Vitals:  Vitals:   09/20/20 0400 09/20/20 0753  BP:  111/82  Pulse:  92  Resp: 20 15  Temp:  36.8 C  SpO2:  93%    Last Pain:  Vitals:   09/20/20 0753  TempSrc: Oral  PainSc: 0-No pain                 Shelton Silvas

## 2020-09-21 LAB — TYPE AND SCREEN
ABO/RH(D): A POS
Antibody Screen: NEGATIVE
Unit division: 0
Unit division: 0
Unit division: 0
Unit division: 0

## 2020-09-21 LAB — BPAM RBC
Blood Product Expiration Date: 202208302359
Blood Product Expiration Date: 202208302359
Blood Product Expiration Date: 202208312359
Blood Product Expiration Date: 202208312359
ISSUE DATE / TIME: 202208011329
ISSUE DATE / TIME: 202208011329
Unit Type and Rh: 6200
Unit Type and Rh: 6200
Unit Type and Rh: 6200
Unit Type and Rh: 6200

## 2020-09-22 ENCOUNTER — Ambulatory Visit: Payer: Managed Care, Other (non HMO)

## 2020-09-22 MED FILL — Gentamicin Sulfate Inj 40 MG/ML: INTRAMUSCULAR | Qty: 80 | Status: AC

## 2020-09-24 LAB — AEROBIC/ANAEROBIC CULTURE W GRAM STAIN (SURGICAL/DEEP WOUND): Culture: NO GROWTH

## 2020-09-29 ENCOUNTER — Other Ambulatory Visit: Payer: Self-pay

## 2020-09-29 ENCOUNTER — Ambulatory Visit (INDEPENDENT_AMBULATORY_CARE_PROVIDER_SITE_OTHER): Payer: Managed Care, Other (non HMO)

## 2020-09-29 DIAGNOSIS — I255 Ischemic cardiomyopathy: Secondary | ICD-10-CM

## 2020-09-29 NOTE — Patient Instructions (Signed)
Wash area with soap and warm water daily. Keep dry open to air. Do not put any lotions, oitments or creams on area until completely healed. Please monitor and call if you see any increased redness, warmth, drainage, bleeding, pain, fever or chills.  Device Clinic [phone]

## 2020-09-29 NOTE — Progress Notes (Signed)
Patient seen in device clinic today for suture removal. 8 sutures removed from 2 incisions located on left chest wall. Edges are well approximated. Some redness noted at suture sites. Wound appears well healed. No drainage noted at today's visit and per patient none at home.  Wound care education provided and to monitor to increased s/s of infection. Patient gave verbal understanding. Direct phone number provided.   Wash area with soap and warm water daily. Keep dry open to air. Do not put any lotions, oitments or creams on area until completely healed. Please monitor and call if you see any increased redness, warmth, drainage, bleeding, pain, fever or chills.

## 2020-10-06 ENCOUNTER — Other Ambulatory Visit: Payer: Self-pay | Admitting: Internal Medicine

## 2020-10-14 ENCOUNTER — Other Ambulatory Visit: Payer: Self-pay

## 2020-10-14 ENCOUNTER — Ambulatory Visit (HOSPITAL_COMMUNITY)
Admission: RE | Admit: 2020-10-14 | Discharge: 2020-10-14 | Disposition: A | Discharge status: Home / Self Care | Payer: Managed Care, Other (non HMO) | Source: Ambulatory Visit | Attending: Cardiology | Admitting: Cardiology

## 2020-10-14 ENCOUNTER — Encounter (HOSPITAL_COMMUNITY): Payer: Self-pay | Admitting: Cardiology

## 2020-10-14 VITALS — BP 110/78 | HR 66 | Wt 191.4 lb

## 2020-10-14 DIAGNOSIS — Z95810 Presence of automatic (implantable) cardiac defibrillator: Secondary | ICD-10-CM | POA: Diagnosis not present

## 2020-10-14 DIAGNOSIS — I5022 Chronic systolic (congestive) heart failure: Secondary | ICD-10-CM | POA: Diagnosis not present

## 2020-10-14 DIAGNOSIS — Z8249 Family history of ischemic heart disease and other diseases of the circulatory system: Secondary | ICD-10-CM | POA: Insufficient documentation

## 2020-10-14 DIAGNOSIS — E785 Hyperlipidemia, unspecified: Secondary | ICD-10-CM | POA: Diagnosis not present

## 2020-10-14 DIAGNOSIS — Z79899 Other long term (current) drug therapy: Secondary | ICD-10-CM | POA: Insufficient documentation

## 2020-10-14 DIAGNOSIS — I252 Old myocardial infarction: Secondary | ICD-10-CM | POA: Insufficient documentation

## 2020-10-14 DIAGNOSIS — Z7901 Long term (current) use of anticoagulants: Secondary | ICD-10-CM | POA: Insufficient documentation

## 2020-10-14 DIAGNOSIS — Z87891 Personal history of nicotine dependence: Secondary | ICD-10-CM | POA: Insufficient documentation

## 2020-10-14 DIAGNOSIS — Z7982 Long term (current) use of aspirin: Secondary | ICD-10-CM | POA: Diagnosis not present

## 2020-10-14 DIAGNOSIS — Z955 Presence of coronary angioplasty implant and graft: Secondary | ICD-10-CM | POA: Diagnosis not present

## 2020-10-14 DIAGNOSIS — Z7984 Long term (current) use of oral hypoglycemic drugs: Secondary | ICD-10-CM | POA: Diagnosis not present

## 2020-10-14 DIAGNOSIS — I2510 Atherosclerotic heart disease of native coronary artery without angina pectoris: Secondary | ICD-10-CM | POA: Diagnosis not present

## 2020-10-14 LAB — BASIC METABOLIC PANEL
Anion gap: 7 (ref 5–15)
BUN: 14 mg/dL (ref 6–20)
CO2: 28 mmol/L (ref 22–32)
Calcium: 9.4 mg/dL (ref 8.9–10.3)
Chloride: 105 mmol/L (ref 98–111)
Creatinine, Ser: 0.62 mg/dL (ref 0.61–1.24)
GFR, Estimated: >60 mL/min (ref >60)
Glucose, Bld: 91 mg/dL (ref 70–99)
Potassium: 3.8 mmol/L (ref 3.5–5.1)
Sodium: 140 mmol/L (ref 135–145)

## 2020-10-14 LAB — LIPID PANEL
Cholesterol: 139 mg/dL (ref 0–200)
HDL: 36 mg/dL — ABNORMAL LOW (ref >40)
LDL Cholesterol: 69 mg/dL (ref 0–99)
Total CHOL/HDL Ratio: 3.9 RATIO
Triglycerides: 170 mg/dL — ABNORMAL HIGH (ref <150)
VLDL: 34 mg/dL (ref 0–40)

## 2020-10-14 MED ORDER — ASPIRIN EC 81 MG PO TBEC: 81.0000 mg | DELAYED_RELEASE_TABLET | Freq: Every day | ORAL | 3 refills | Status: DC

## 2020-10-14 NOTE — Patient Instructions (Addendum)
Labs done today. We will contact you only if your labs are abnormal.  STOP taking Brilinta  START Aspirin 81mg  (1 tablet) by mouth daily.    No other medication changes were made. Please continue all current medications as prescribed.  Your physician recommends that you schedule a follow-up appointment next week for an echo and in 4 months with Dr.  Your physician has requested that you have an echocardiogram. Echocardiography is a painless test that uses sound waves to create images of your heart. It provides your doctor with information about the size and shape of your heart and how well your heart's chambers and valves are working. This procedure takes approximately one hour. There are no restrictions for this procedure.  If you have any questions or concerns before your next appointment please send Shirlee Latch a message through [place] or call our office at [phone].    TO LEAVE A MESSAGE FOR THE NURSE SELECT OPTION 2, PLEASE LEAVE A MESSAGE INCLUDING: YOUR NAME DATE OF BIRTH CALL BACK NUMBER REASON FOR CALL**this is important as we prioritize the call backs  YOU WILL RECEIVE A CALL BACK THE SAME DAY AS LONG AS YOU CALL BEFORE 4:00 PM   Do the following things EVERYDAY: Weigh yourself in the morning before breakfast. Write it down and keep it in a log. Take your medicines as prescribed Eat low salt foods--Limit salt (sodium) to 2000 mg per day.  Stay as active as you can everyday Limit all fluids for the day to less than 2 liters   At the [facility], you and your health needs are our priority. As part of our continuing mission to provide you with exceptional heart care, we have created designated Provider Care Teams. These Care Teams include your primary Cardiologist (physician) and Advanced Practice Providers (APPs- Physician Assistants and Nurse Practitioners) who all work together to provide you with the care you need, when you need it.   You may see any  of the following providers on your designated Care Team at your next follow up: Dr [phone] Dr Arvilla Meres, NP Carron Curie, Robbie Lis [place], PharmD   Please be sure to bring in all your medications bottles to every appointment.

## 2020-10-16 ENCOUNTER — Other Ambulatory Visit (HOSPITAL_COMMUNITY): Payer: Self-pay | Admitting: Cardiology

## 2020-10-16 NOTE — Progress Notes (Signed)
PCP: Wynn Banker, MD Cardiology: Dr. Eden Emms HF Cardiology: Dr. Shirlee Latch  55 y.o. with history of chronic systolic CHF and CAD returns for followup of CHF.  Patient was in his usual state of health until around 4/20.  He was admitted at that time with acute CHF, had been short of breath and orthopneic at home.  No chest pain.  At admission, he was found to be volume overloaded with echo showing EF 20%. He was started on cardiac meds and discharged.  Cardiac MRI in 10/20 showed EF 24%, moderately decreased RV systolic function, and full thickness LGE in the basal-mid inferior wall concerning for prior MI. He then had right and left heart cath in 12/20. This showed normal filling pressures and cardiac output.  He had DES to mid LCx/OM2 and distal RCA.    Echo in 3/21 showed EF 20-25% with diffuse hypokinesis.  Mildly decreased RV systolic function. He had [employer] ICD placed in 5/21.  Patient developed an ICD pocket infection, and device was removed in 8/22.   He has been doing well symptomatically.  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  Weight stable at home.   No dyspnea with stairs or with walks, does yardwork with no problems.  No lightheadedness.  No chest pain.   ECG (8/22, personally reviewed): NSR, normal  Labs (11/20): K 4.4, creatinine 0.68, hgb 13.5 Labs (1/21): K 3.9, creatinine 0.77 => 0.61 Labs (3/21): LDL 52 Labs (5/21): K 4, creatinine 0.79 Labs (7/22): K 4.2, creatinine 0.78  PMH: 1. H/o Guillain Barre syndrome with recovery in 1984.  2. GERD 3. Nephrolithiasis 4. PUD 5. 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  Echo (4/20) with EF 20%, moderate to severe LV dilation, moderately decreased RV systolic function, mild-moderate MR.  - Cardiac MRI (10/20): EF 24%, severe LV dilation, diffuse hypokinesis, full thickness scar basal-mid inferolateral wall suggestive of prior MI.  - LHC/RHC (12/20): 90% mLCx/OM2 stenosis treated  with DES, 80% distal RCA stenosis treated with DES.  Mean RA 1, mean PA 14, mean PCWP 9, CI 2.63.  - Echo (3/21): EF 20-25% - [employer] ICD placed but developed pocket infection and device was removed in 8/22.  6. Prior smoker.  7. CAD: LHC (12/20) with 90% mLCx/OM2 stenosis treated with DES, 80% distal RCA stenosis treated with DES.   FH: Mother with CHF developed in her 58s, father with PCI, brother with PCI.   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  No ETOH. Married, lives in [place].  Plays in a classic rock band.   ROS: All systems reviewed and negative except as per HPI.   Current Outpatient Medications  Medication Sig Dispense Refill   acetaminophen (TYLENOL) 500 MG tablet Take 1,000 mg 2 (two) times daily as needed by mouth for moderate pain.     aspirin EC 81 MG tablet Take 1 tablet (81 mg total) by mouth daily. Swallow whole. 90 tablet 3   atorvastatin (LIPITOR) 80 MG tablet TAKE 1 TABLET BY MOUTH EVERY DAY 90 tablet 3   carvedilol (COREG) 25 MG tablet TAKE 1 TABLET (25 MG TOTAL) BY MOUTH 2 (TWO) TIMES DAILY WITH A MEAL. 180 tablet 1   clonazePAM (KLONOPIN) 0.5 MG tablet Take 0.5-1 tablets (0.25-0.5 mg total) by mouth 2 (two) times daily. 60 tablet 1   ENTRESTO 97-103 MG TAKE 1 TABLET BY MOUTH TWICE A DAY 60 tablet 11   FARXIGA 10  MG TABS tablet TAKE 1 TABLET (10 MG TOTAL) BY MOUTH DAILY BEFORE BREAKFAST. PLEASE MAKE A FOLLOW UP APPOINTMENT 90 tablet 0   MELATONIN PO Take 1 tablet by mouth at bedtime as needed (sleep).     Multiple Vitamins-Minerals (MULTIVITAMIN WITH MINERALS) tablet Take 1 tablet by mouth 3 (three) times a week.     omeprazole (PRILOSEC OTC) 20 MG tablet Take 20 mg by mouth daily.     spironolactone (ALDACTONE) 25 MG tablet TAKE 1 TABLET (25 MG TOTAL) BY MOUTH DAILY. PLEASE MAKE A FOLLOW UP APPOINTMENT 90 tablet 0   No current facility-administered medications for this encounter.   BP 110/78   Pulse 66   Wt 86.8 kg  (191 lb 6.4 oz)   SpO2 95%   BMI 25.96 kg/m  General: NAD Neck: No JVD, no thyromegaly or thyroid nodule.  Lungs: Clear to auscultation bilaterally with normal respiratory effort. CV: Nondisplaced PMI.  Heart regular S1/S2, no S3/S4, no murmur.  No peripheral edema.  No carotid bruit.  Normal pedal pulses.  Abdomen: Soft, nontender, no hepatosplenomegaly, no distention.  Skin: Intact without lesions or rashes.  Neurologic: Alert and oriented x 3.  Psych: Normal affect. Extremities: No clubbing or cyanosis.  HEENT: Normal.   Assessment/Plan: 1. Chronic systolic CHF: Suspect mixed ischemic/nonischemic cardiomyopathy.  I do not think that his CAD can explain the extent of the cardiomyopathy. Possible co-existing viral myocarditis though signs of this were not seen on MRI (cannot rule out).  No heavy ETOH or drug use.  Mother with CHF at age 72, uncertain of diagnosis => familial cardiomyopathy is a consideration.  Echo in 3/21 showed that EF remained 20-25% so [employer] ICD was placed, this was explanted in 8/22 due to pocket site infection.  NYHA class I-II symptoms.  Not volume overloaded on exam.  - Continue Coreg 25 mg bid.  - Continue Entresto 97/103 bid.  - Continue spironolactone 25 mg daily.  BMET today.  - Continue dapagliflozin.  - We discussed replacing ICD.  He is understandably nervous about recurrent infection, but he is relatively young. He did not want to wear a Lifevest.  I will arrange for echo next week, and if EF remains low, think he should discuss subcutaneous ICD with Dr. Graciela Husbands (has appointment next month).  2. CAD: 12/20 PCI to mid LCx/OM2 and distal RCA.  As above, I do not think CAD alone can account for his cardiomyopathy (though suspect it plays a role).   - Continue ASA 81 daily.  - Continue statin, check lipids today.   - >1 year post PCI, he can stop Brilinta.   Followup in 4 months.   Marca Ancona 10/16/2020

## 2020-10-20 ENCOUNTER — Other Ambulatory Visit: Payer: Self-pay

## 2020-10-20 ENCOUNTER — Ambulatory Visit (HOSPITAL_COMMUNITY)
Admission: RE | Admit: 2020-10-20 | Discharge: 2020-10-20 | Disposition: A | Discharge status: Home / Self Care | Payer: Managed Care, Other (non HMO) | Source: Ambulatory Visit | Attending: Cardiology | Admitting: Cardiology

## 2020-10-20 DIAGNOSIS — I081 Rheumatic disorders of both mitral and tricuspid valves: Secondary | ICD-10-CM | POA: Insufficient documentation

## 2020-10-20 DIAGNOSIS — I5022 Chronic systolic (congestive) heart failure: Secondary | ICD-10-CM | POA: Insufficient documentation

## 2020-10-20 LAB — ECHOCARDIOGRAM COMPLETE
AR max vel: 3.52 cm2
AV Area VTI: 2.76 cm2
AV Area mean vel: 3.22 cm2
AV Mean grad: 4 mmHg
AV Peak grad: 8.4 mmHg
Ao pk vel: 1.45 m/s
Area-P 1/2: 3.53 cm2
Calc EF: 49.1 %
S' Lateral: 3.2 cm
Single Plane A2C EF: 47.3 %
Single Plane A4C EF: 49.3 %

## 2020-10-25 ENCOUNTER — Other Ambulatory Visit: Payer: Self-pay

## 2020-10-25 ENCOUNTER — Encounter: Payer: Self-pay | Admitting: Internal Medicine

## 2020-10-25 ENCOUNTER — Ambulatory Visit (INDEPENDENT_AMBULATORY_CARE_PROVIDER_SITE_OTHER): Payer: Managed Care, Other (non HMO) | Admitting: Internal Medicine

## 2020-10-25 VITALS — BP 104/82 | HR 77 | Ht 72.0 in | Wt 189.4 lb

## 2020-10-25 DIAGNOSIS — I5022 Chronic systolic (congestive) heart failure: Secondary | ICD-10-CM | POA: Diagnosis not present

## 2020-10-25 DIAGNOSIS — I428 Other cardiomyopathies: Secondary | ICD-10-CM

## 2020-10-25 NOTE — Progress Notes (Signed)
Patient Care Team: Wynn Banker, MD as PCP - General (Family Medicine) Wendall Stade, MD as PCP - Cardiology (Cardiology)   HPI  Austin [name] is a 55 y.o. male seen in followup for an ICD implanted 5/21 for primary prevention in the setting of ischemic heart disease and modest congestive heart failure.  At about 1 year, he developed a draining pocket.  And underwent device explantation with Dr. Leonia Reeves 8/22 with deferral of reimplantation   Evaluation fall/2020 demonstrated a full-thickness scar by MRI scanning CTA was concerning for occlusive coronary disease and he underwent catheterization and subsequent stenting 12/20.   The patient denies chest pain, shortness of breath, nocturnal dyspnea, orthopnea or peripheral edema.  There have been no palpitations, lightheadedness or syncope.    FHx + CAD.  DATE TEST EF   4/20 Echo   20 % INF AK   10/20 cMRI   Full thickness Infer Scar  11/20 CTA  24 %   12/20 LHC  LADp20; OM2 90>>stent ;RCAd 80>Stent  3/21 Echo  20-25%   4/21 cMRI  27% 2/3 thickness scar   9/22 Echo  40%     Date Cr K Hgb LDL  3/21 0.59 4.1 15.0 (12/20)   8/22 0.62 3.8  69      Records and Results Reviewed   Past Medical History:  Diagnosis Date   AICD (automatic cardioverter/defibrillator) present    Anxiety    CHF (congestive heart failure) (HCC)    Gastric ulcer    GERD (gastroesophageal reflux disease)    Guillain Barr syndrome (HCC) 1984   COMPLETE RECOVERY; triggered from flu illness   Headache(784.0)    hx of tension-none recent   Hematemesis 09/07/2016   History of kidney stones 09/19/2012   left ureteral stone-SURGERY 8/6 URETEROSCOPY / STENT PLACEMENT    Partial gastric outlet obstruction    UGIB (upper gastrointestinal bleed) 09/07/2016    Past Surgical History:  Procedure Laterality Date   BALLOON DILATION N/A 11/15/2016   Procedure: BALLOON DILATION;  Surgeon: Benancio Deeds, MD;  Location: [facility];   Service: Gastroenterology;  Laterality: N/A;   CORONARY STENT INTERVENTION N/A 01/21/2019   Procedure: CORONARY STENT INTERVENTION;  Surgeon: Kathleene Hazel, MD;  Location: [facility];  Service: Cardiovascular;  Laterality: N/A;   CYSTO, LEFT RGP, DIAGNOSTIC URETEROSCOPY AND STENT PLACEMENT  09/24/2012   CYSTOSCOPY WITH RETROGRADE PYELOGRAM, URETEROSCOPY AND STENT PLACEMENT Left 09/24/2012   Procedure: CYSTOSCOPY WITH RETROGRADE PYELOGRAM, DIAGNOSTIC URETEROSCOPY AND STENT PLACEMENT;  Surgeon: Sebastian Ache, MD;  Location: [facility];  Service: Urology;  Laterality: Left;   CYSTOSCOPY WITH RETROGRADE PYELOGRAM, URETEROSCOPY AND STENT PLACEMENT Left 10/08/2012   Procedure: CYSTOSCOPY WITH RETROGRADE PYELOGRAM, URETEROSCOPY AND STENT PLACEMENT;  Surgeon: Sebastian Ache, MD;  Location: [facility];  Service: Urology;  Laterality: Left;  1 HR NEEDS DIGITAL URETEROSCOPE    ESOPHAGOGASTRODUODENOSCOPY N/A 09/08/2016   Procedure: ESOPHAGOGASTRODUODENOSCOPY (EGD);  Surgeon: Ruffin Frederick, MD;  Location: [facility];  Service: Gastroenterology;  Laterality: N/A;   ESOPHAGOGASTRODUODENOSCOPY N/A 11/15/2016   Procedure: ESOPHAGOGASTRODUODENOSCOPY (EGD);  Surgeon: Benancio Deeds, MD;  Location: [facility];  Service: Gastroenterology;  Laterality: N/A;   ESOPHAGOGASTRODUODENOSCOPY N/A 12/31/2016   Procedure: ESOPHAGOGASTRODUODENOSCOPY (EGD);  Surgeon: Benancio Deeds, MD;  Location: [facility];  Service: Gastroenterology;  Laterality: N/A;   ESOPHAGOGASTRODUODENOSCOPY (EGD) WITH PROPOFOL N/A 10/15/2016   Procedure: ESOPHAGOGASTRODUODENOSCOPY (EGD) WITH PROPOFOL; Duodenal dilation;  Surgeon: Ruffin Frederick, MD;  Location: [facility];  Service: Gastroenterology;  Laterality: N/A;   HOLMIUM LASER APPLICATION Left 10/08/2012   Procedure: HOLMIUM LASER APPLICATION;  Surgeon: Sebastian Ache, MD;  Location: [facility];  Service: Urology;  Laterality: Left;   ICD IMPLANT N/A 07/06/2019    Procedure: ICD IMPLANT;  Surgeon: Duke Salvia, MD;  Location: [facility] INVASIVE CV LAB;  Service: Cardiovascular;  Laterality: N/A;   ICD LEAD REMOVAL N/A 09/19/2020   Procedure: ICD SYSTEM EXTRACTION;  Surgeon: Marinus Maw, MD;  Location: [facility] OR;  Service: Cardiovascular;  Laterality: N/A;   RIGHT/LEFT HEART CATH AND CORONARY ANGIOGRAPHY N/A 01/21/2019   Procedure: RIGHT/LEFT HEART CATH AND CORONARY ANGIOGRAPHY;  Surgeon: Kathleene Hazel, MD;  Location: [facility];  Service: Cardiovascular;  Laterality: N/A;   SAVORY DILATION N/A 12/31/2016   Procedure: POSSIBLE SAVORY DILATION;  Surgeon: Benancio Deeds, MD;  Location: [facility];  Service: Gastroenterology;  Laterality: N/A;   TEE WITHOUT CARDIOVERSION  09/19/2020   Procedure: TRANSESOPHAGEAL ECHOCARDIOGRAM (TEE);  Surgeon: Marinus Maw, MD;  Location: [facility] OR;  Service: Cardiovascular;;   TONSILLECTOMY     t&a as a child   wisdom teeth extracted      Current Meds  Medication Sig   acetaminophen (TYLENOL) 500 MG tablet Take 1,000 mg 2 (two) times daily as needed by mouth for moderate pain.   aspirin EC 81 MG tablet Take 1 tablet (81 mg total) by mouth daily. Swallow whole.   atorvastatin (LIPITOR) 80 MG tablet TAKE 1 TABLET BY MOUTH EVERY DAY   carvedilol (COREG) 25 MG tablet TAKE 1 TABLET (25 MG TOTAL) BY MOUTH 2 (TWO) TIMES DAILY WITH A MEAL.   clonazePAM (KLONOPIN) 0.5 MG tablet Take 0.5-1 tablets (0.25-0.5 mg total) by mouth 2 (two) times daily.   ENTRESTO 97-103 MG TAKE 1 TABLET BY MOUTH TWICE A DAY   FARXIGA 10 MG TABS tablet TAKE 1 TABLET (10 MG TOTAL) BY MOUTH DAILY BEFORE BREAKFAST. PLEASE MAKE A FOLLOW UP APPOINTMENT   MELATONIN PO Take 1 tablet by mouth at bedtime as needed (sleep).   Multiple Vitamins-Minerals (MULTIVITAMIN WITH MINERALS) tablet Take 1 tablet by mouth 3 (three) times a week.   omeprazole (PRILOSEC OTC) 20 MG tablet Take 20 mg by mouth daily.   spironolactone (ALDACTONE) 25 MG tablet  Take 1 tablet (25 mg total) by mouth daily.    No Known Allergies    Review of Systems negative except from HPI and PMH  Physical Exam BP 104/82   Pulse 77   Ht 6' (1.829 m)   Wt 189 lb 6.4 oz (85.9 kg)   SpO2 94%   BMI 25.69 kg/m  Well developed and nourished in no acute distress HENT normal Neck supple with JVP-  flat  Scar well healed  Clear Regular rate and rhythm, no murmurs or gallops Abd-soft with active BS No Clubbing cyanosis edema Skin-warm and dry A & Oriented  Grossly normal sensory and motor function  ECG sinus @ 77 16/10/39   Estimated Creatinine Clearance: 117.2 mL/min (by C-G formula based on SCr of 0.62 mg/dL).        Assessment and Plan:  Ischemic and Nonischemic Cardiomyopathy  CAD s/p revascularization RCA and OM  Congestive heart failure-chronic-systolic class 1  Implantable defibrillator-[employer]  Vaccination status negative  Guillain-Barr history   Pt heart failure status is stable. Continue spiro 25 mg.  Electrolytes are stable.  For his cardiomyopathy we will continue the Entresto 97/103 Farxiga 10 and  carvedilol 25 twice daily.  Lengthy discussion, with his improved ejection fraction and particularly light of the new guideline directed medical therapies, he is disinclined to undertake reimplantation.  I would concur.  We will have him follow-up with Dr. Genene Churn and DM we will see him again as needed         Sherryl Manges

## 2020-10-25 NOTE — Patient Instructions (Signed)

## 2020-11-05 ENCOUNTER — Other Ambulatory Visit (HOSPITAL_COMMUNITY): Payer: Self-pay | Admitting: Cardiology

## 2020-11-05 ENCOUNTER — Other Ambulatory Visit: Payer: Self-pay | Admitting: Cardiovascular Disease

## 2020-11-17 ENCOUNTER — Encounter: Payer: Self-pay | Admitting: Family Medicine

## 2020-11-18 ENCOUNTER — Other Ambulatory Visit: Payer: Self-pay | Admitting: Family Medicine

## 2020-11-18 MED ORDER — CLONAZEPAM 0.5 MG PO TABS: 0.2500 mg | ORAL_TABLET | Freq: Two times a day (BID) | ORAL | 1 refills | Status: DC

## 2020-12-16 ENCOUNTER — Ambulatory Visit: Payer: Managed Care, Other (non HMO) | Admitting: Internal Medicine

## 2021-01-26 ENCOUNTER — Encounter: Payer: Self-pay | Admitting: Family Medicine

## 2021-01-30 MED ORDER — CLONAZEPAM 0.5 MG PO TABS: 0.2500 mg | ORAL_TABLET | Freq: Two times a day (BID) | ORAL | 2 refills | Status: DC

## 2021-02-04 ENCOUNTER — Other Ambulatory Visit (HOSPITAL_COMMUNITY): Payer: Self-pay | Admitting: Cardiology

## 2021-02-14 ENCOUNTER — Ambulatory Visit (HOSPITAL_COMMUNITY)
Admission: RE | Admit: 2021-02-14 | Discharge: 2021-02-14 | Disposition: A | Discharge status: Home / Self Care | Payer: Managed Care, Other (non HMO) | Source: Ambulatory Visit | Attending: Cardiology | Admitting: Cardiology

## 2021-02-14 ENCOUNTER — Other Ambulatory Visit: Payer: Self-pay

## 2021-02-14 ENCOUNTER — Encounter (HOSPITAL_COMMUNITY): Payer: Self-pay | Admitting: Cardiology

## 2021-02-14 VITALS — BP 130/62 | HR 79 | Wt 192.0 lb

## 2021-02-14 DIAGNOSIS — Z955 Presence of coronary angioplasty implant and graft: Secondary | ICD-10-CM | POA: Diagnosis not present

## 2021-02-14 DIAGNOSIS — I2510 Atherosclerotic heart disease of native coronary artery without angina pectoris: Secondary | ICD-10-CM | POA: Insufficient documentation

## 2021-02-14 DIAGNOSIS — Z79899 Other long term (current) drug therapy: Secondary | ICD-10-CM | POA: Insufficient documentation

## 2021-02-14 DIAGNOSIS — Z7982 Long term (current) use of aspirin: Secondary | ICD-10-CM | POA: Diagnosis not present

## 2021-02-14 DIAGNOSIS — Z8249 Family history of ischemic heart disease and other diseases of the circulatory system: Secondary | ICD-10-CM | POA: Diagnosis not present

## 2021-02-14 DIAGNOSIS — I5022 Chronic systolic (congestive) heart failure: Secondary | ICD-10-CM | POA: Diagnosis not present

## 2021-02-14 DIAGNOSIS — Z7984 Long term (current) use of oral hypoglycemic drugs: Secondary | ICD-10-CM | POA: Diagnosis not present

## 2021-02-14 LAB — BASIC METABOLIC PANEL
Anion gap: 8 (ref 5–15)
BUN: 16 mg/dL (ref 6–20)
CO2: 23 mmol/L (ref 22–32)
Calcium: 9.2 mg/dL (ref 8.9–10.3)
Chloride: 107 mmol/L (ref 98–111)
Creatinine, Ser: 0.73 mg/dL (ref 0.61–1.24)
GFR, Estimated: >60 mL/min (ref >60)
Glucose, Bld: 106 mg/dL — ABNORMAL HIGH (ref 70–99)
Potassium: 3.5 mmol/L (ref 3.5–5.1)
Sodium: 138 mmol/L (ref 135–145)

## 2021-02-14 NOTE — Patient Instructions (Signed)
Labs done today, your results will be available in MyChart, we will contact you for abnormal readings.  Your physician recommends that you return for lab work in: 3 months  Your physician recommends that you schedule a follow-up appointment in: 6 months (June 2023), **PLEASE CALL OUR OFFICE IN April TO SCHEDULE THIS APPOINTMENT  If you have any questions or concerns before your next appointment please send [place] a message through [place] or call our office at [phone].    TO LEAVE A MESSAGE FOR THE NURSE SELECT OPTION 2, PLEASE LEAVE A MESSAGE INCLUDING: YOUR NAME DATE OF BIRTH CALL BACK NUMBER REASON FOR CALL**this is important as we prioritize the call backs  YOU WILL RECEIVE A CALL BACK THE SAME DAY AS LONG AS YOU CALL BEFORE 4:00 PM  At the [facility], you and your health needs are our priority. As part of our continuing mission to provide you with exceptional heart care, we have created designated Provider Care Teams. These Care Teams include your primary Cardiologist (physician) and Advanced Practice Providers (APPs- Physician Assistants and Nurse Practitioners) who all work together to provide you with the care you need, when you need it.   You may see any of the following providers on your designated Care Team at your next follow up: Dr Arvilla Meres Dr Carron Curie, NP Robbie Lis, [place] [facility] [place], [place] Karle Plumber, PharmD   Please be sure to bring in all your medications bottles to every appointment.

## 2021-02-14 NOTE — Progress Notes (Signed)
PCP: Wynn Banker, MD Cardiology: Dr. Eden Emms HF Cardiology: Dr. Shirlee Latch  55 y.o. with history of chronic systolic CHF and CAD returns for followup of CHF.  Patient was in his usual state of health until around 4/20.  He was admitted at that time with acute CHF, had been short of breath and orthopneic at home.  No chest pain.  At admission, he was found to be volume overloaded with echo showing EF 20%. He was started on cardiac meds and discharged.  Cardiac MRI in 10/20 showed EF 24%, moderately decreased RV systolic function, and full thickness LGE in the basal-mid inferior wall concerning for prior MI. He then had right and left heart cath in 12/20. This showed normal filling pressures and cardiac output.  He had DES to mid LCx/OM2 and distal RCA.    Echo in 3/21 showed EF 20-25% with diffuse hypokinesis.  Mildly decreased RV systolic function. He had [employer] ICD placed in 5/21.  Patient developed an ICD pocket infection, and device was removed in 8/22.  Echo in 9/22 showed EF 40%, global hypokinesis, RV normal.   He has been doing well symptomatically.  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  No significant exertional dyspnea, this is improved since stopping ticagrelor.  No chest pain.  No orthopnea/PND.  No lightheadedness.   Labs (11/20): K 4.4, creatinine 0.68, hgb 13.5 Labs (1/21): K 3.9, creatinine 0.77 => 0.61 Labs (3/21): LDL 52 Labs (5/21): K 4, creatinine 0.79 Labs (7/22): K 4.2, creatinine 0.78 Labs (8/22): LDL 69, TGs 170, K 3.8, creatinine 0.62  PMH: 1. H/o Guillain Barre syndrome with recovery in 1984.  2. GERD 3. Nephrolithiasis 4. PUD 5. 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  Echo (4/20) with EF 20%, moderate to severe LV dilation, moderately decreased RV systolic function, mild-moderate MR.  - Cardiac MRI (10/20): EF 24%, severe LV dilation, diffuse hypokinesis, full thickness scar basal-mid inferolateral wall  suggestive of prior MI.  - LHC/RHC (12/20): 90% mLCx/OM2 stenosis treated with DES, 80% distal RCA stenosis treated with DES.  Mean RA 1, mean PA 14, mean PCWP 9, CI 2.63.  - Echo (3/21): EF 20-25% - [employer] ICD placed but developed pocket infection and device was removed in 8/22.  6. Prior smoker.  7. CAD: LHC (12/20) with 90% mLCx/OM2 stenosis treated with DES, 80% distal RCA stenosis treated with DES.  - Dyspnea with ticagrelor.   FH: Mother with CHF developed in her 55s, father with PCI, brother with PCI.   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  No ETOH. Married, lives in [place].  Plays in a classic rock band.   ROS: All systems reviewed and negative except as per HPI.   Current Outpatient Medications  Medication Sig Dispense Refill   acetaminophen (TYLENOL) 500 MG tablet Take 1,000 mg 2 (two) times daily as needed by mouth for moderate pain.     aspirin EC 81 MG tablet Take 1 tablet (81 mg total) by mouth daily. Swallow whole. 90 tablet 3   atorvastatin (LIPITOR) 80 MG tablet TAKE 1 TABLET BY MOUTH EVERY DAY 90 tablet 3   carvedilol (COREG) 25 MG tablet TAKE 1 TABLET (25 MG TOTAL) BY MOUTH 2 (TWO) TIMES DAILY WITH A MEAL. 180 tablet 1   clonazePAM (KLONOPIN) 0.5 MG tablet Take 0.5-1 tablets (0.25-0.5 mg total) by mouth 2 (two) times daily. 60 tablet 2   ENTRESTO 97-103 MG TAKE  1 TABLET BY MOUTH TWICE A DAY 60 tablet 11   FARXIGA 10 MG TABS tablet TAKE 1 TABLET (10 MG TOTAL) BY MOUTH DAILY BEFORE BREAKFAST. PLEASE MAKE A FOLLOW UP APPOINTMENT 90 tablet 0   MELATONIN PO Take 1 tablet by mouth at bedtime as needed (sleep).     Multiple Vitamins-Minerals (MULTIVITAMIN WITH MINERALS) tablet Take 1 tablet by mouth 3 (three) times a week.     omeprazole (PRILOSEC OTC) 20 MG tablet Take 20 mg by mouth daily.     spironolactone (ALDACTONE) 25 MG tablet Take 1 tablet (25 mg total) by mouth daily. 90 tablet 3   No current facility-administered  medications for this encounter.   BP 130/62    Pulse 79    Wt 87.1 kg (192 lb)    SpO2 94%    BMI 26.04 kg/m  General: NAD Neck: No JVD, no thyromegaly or thyroid nodule.  Lungs: Clear to auscultation bilaterally with normal respiratory effort. CV: Nondisplaced PMI.  Heart regular S1/S2, no S3/S4, no murmur.  No peripheral edema.  No carotid bruit.  Normal pedal pulses.  Abdomen: Soft, nontender, no hepatosplenomegaly, no distention.  Skin: Intact without lesions or rashes.  Neurologic: Alert and oriented x 3.  Psych: Normal affect. Extremities: No clubbing or cyanosis.  HEENT: Normal.   Assessment/Plan: 1. Chronic systolic CHF: Suspect mixed ischemic/nonischemic cardiomyopathy.  I do not think that his CAD can explain the extent of the cardiomyopathy. Possible co-existing viral myocarditis though signs of this were not seen on MRI (cannot rule out).  No heavy ETOH or drug use.  Mother with CHF at age 4, uncertain of diagnosis => familial cardiomyopathy is a consideration.  Echo in 3/21 showed that EF remained 20-25% so [employer] ICD was placed, this was explanted in 8/22 due to pocket site infection.  Echo in 9/22 showed improved EF, 40%.  NYHA class I symptoms.  Not volume overloaded.  - Continue Coreg 25 mg bid.  - Continue Entresto 97/103 bid.  - Continue spironolactone 25 mg daily.  BMET today.  - Continue dapagliflozin.  - He is out of range at this point for ICD, will not replace.  2. CAD: 12/20 PCI to mid LCx/OM2 and distal RCA.  As above, I do not think CAD alone can account for his cardiomyopathy (though suspect it plays a role).   - Continue ASA 81 daily.  - Continue statin, lipids ok in 8/22.    BMET 3 months, followup 6 months.   Marca Ancona 02/14/2021

## 2021-03-14 ENCOUNTER — Encounter (HOSPITAL_COMMUNITY): Payer: Self-pay

## 2021-03-14 ENCOUNTER — Other Ambulatory Visit (HOSPITAL_COMMUNITY): Payer: Self-pay | Admitting: *Deleted

## 2021-03-14 MED ORDER — CARVEDILOL 25 MG PO TABS: 25.0000 mg | ORAL_TABLET | Freq: Two times a day (BID) | ORAL | 1 refills | Status: DC

## 2021-04-11 IMAGING — CT CT ANGIOGRAPHY CHEST
2 of 6 series · 18 of 46 positions shown · IV contrast (omnipaque)
Comparison: Chest radiograph June 05, 2018

CLINICAL DATA: Shortness of breath

EXAM:
CT ANGIOGRAPHY CHEST WITH CONTRAST
TECHNIQUE: Multidetector CT imaging of the chest was performed using the
standard protocol during bolus administration of intravenous
contrast. Multiplanar CT image reconstructions and MIPs were
obtained to evaluate the vascular anatomy.
CONTRAST:  100mL OMNIPAQUE IOHEXOL 350 MG/ML SOLN

[Series 5: thins · axial · 0.86mm/px · z∈[+1340,+1655]mm · 15 of 347 slices shown]
[im 16/347  lung]
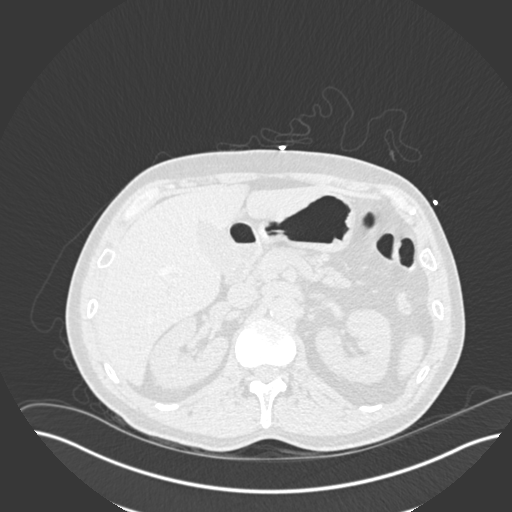
[im 46/347  soft-tissue]
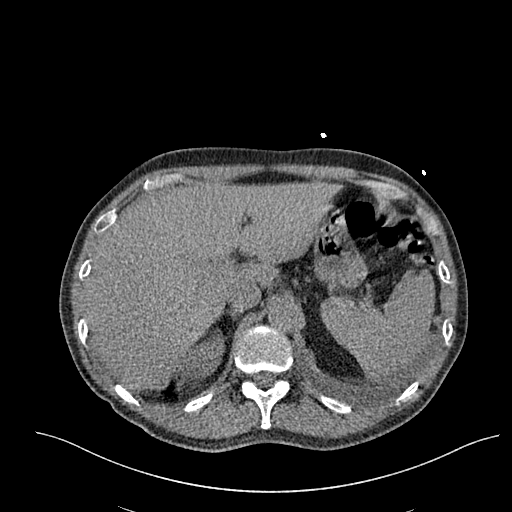
[im 61/347  lung]
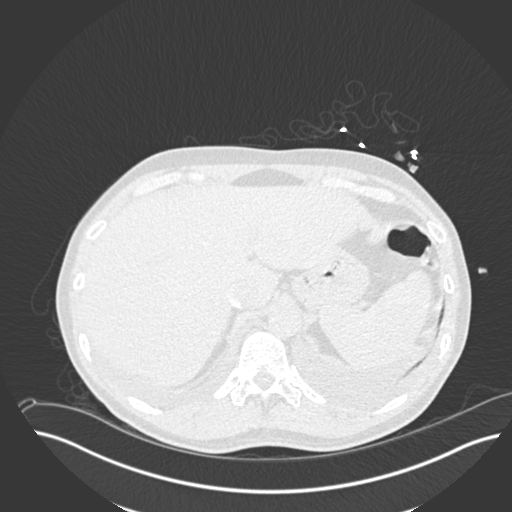
[im 91/347  soft-tissue]
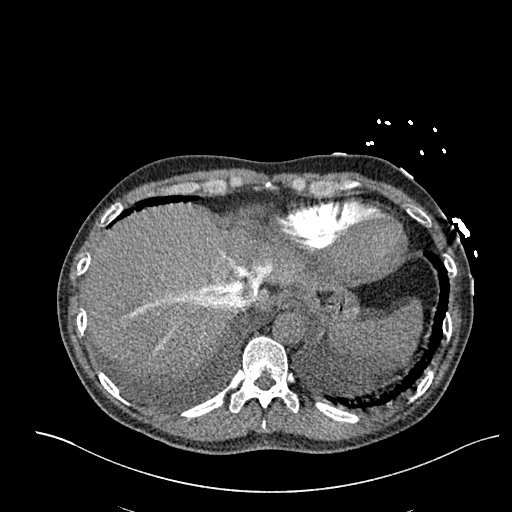
[im 106/347  lung]
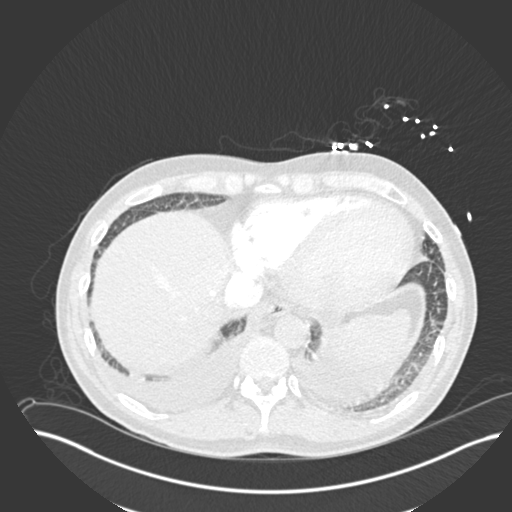
[im 136/347  soft-tissue]
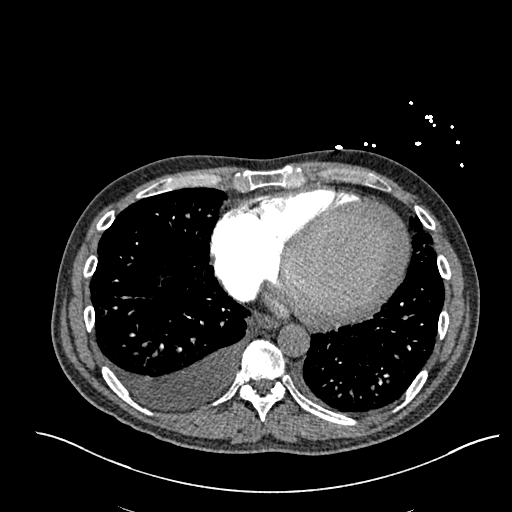
[im 151/347  lung]
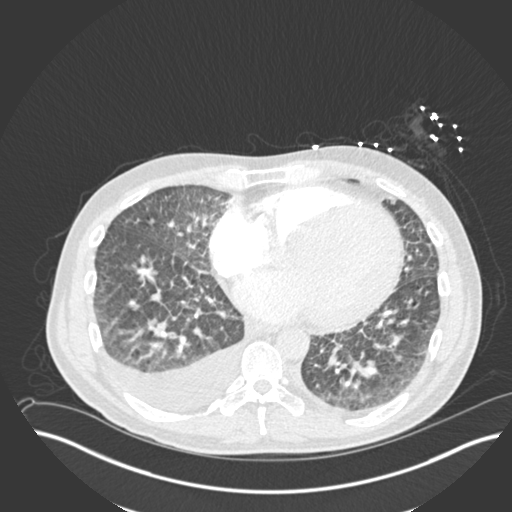
[im 181/347  soft-tissue]
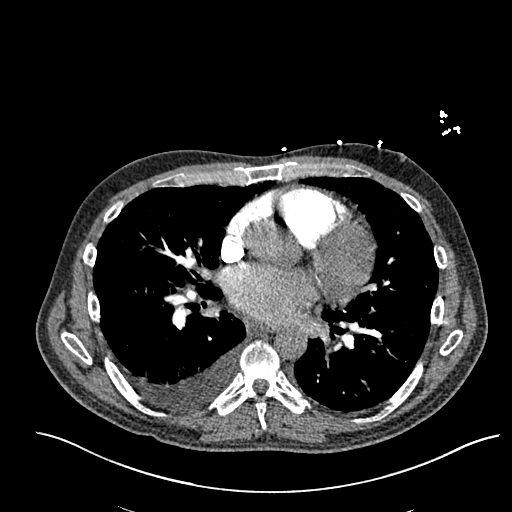
[im 196/347  lung]
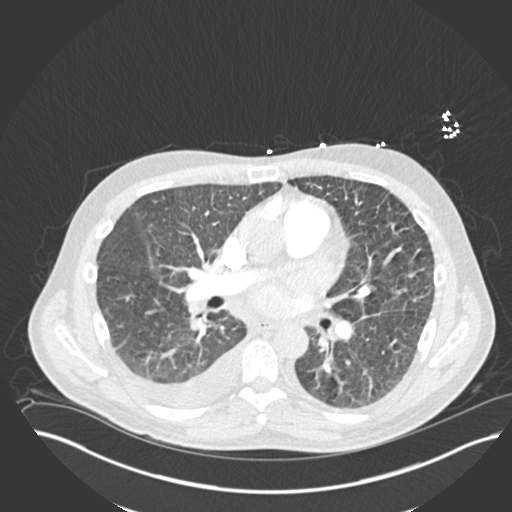
[im 211/347  soft-tissue]
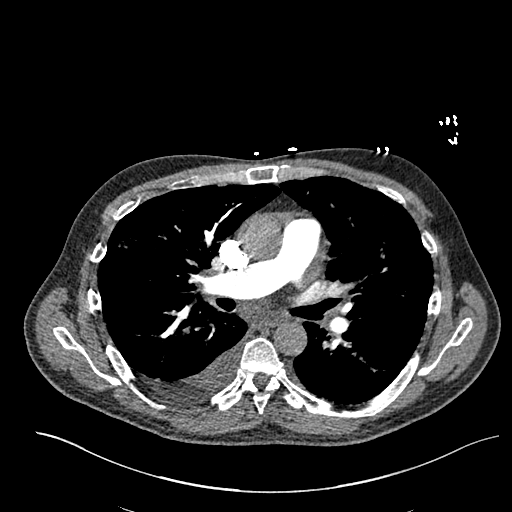
[im 241/347  lung]
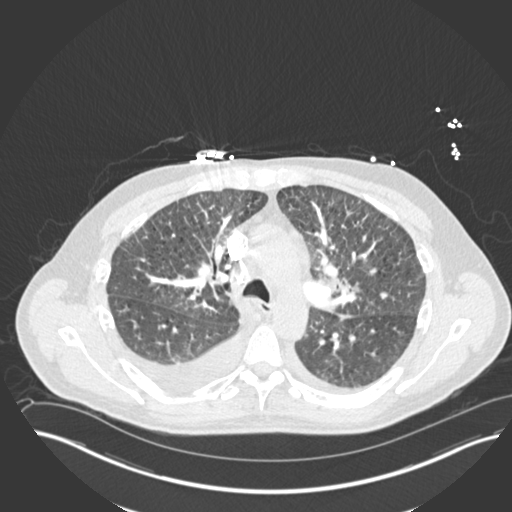
[im 256/347  soft-tissue]
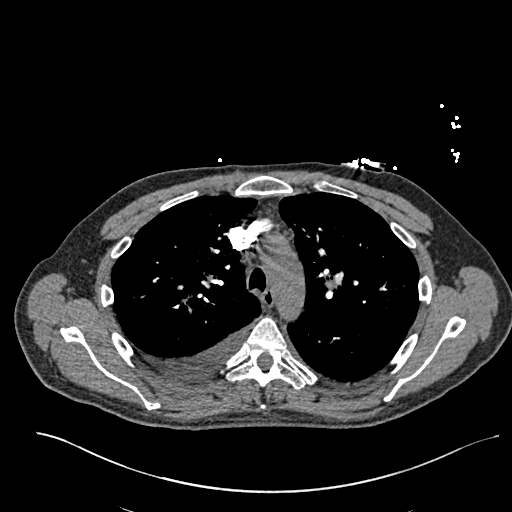
[im 286/347  lung]
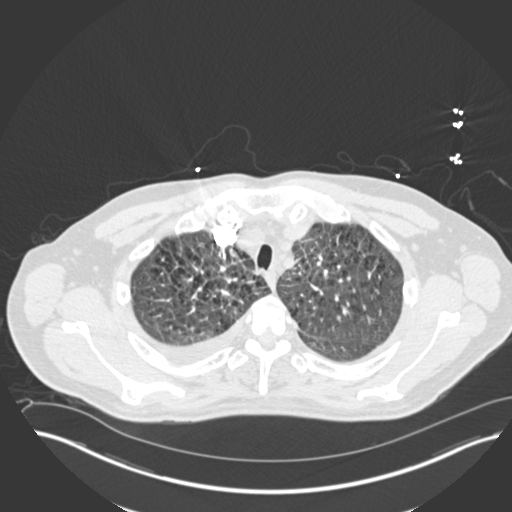
[im 301/347  soft-tissue]
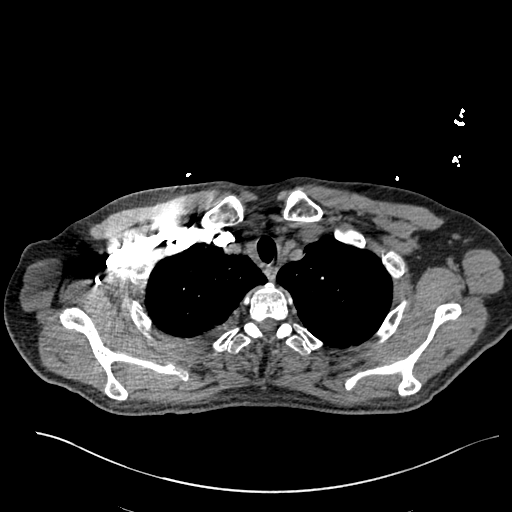
[im 331/347  lung]
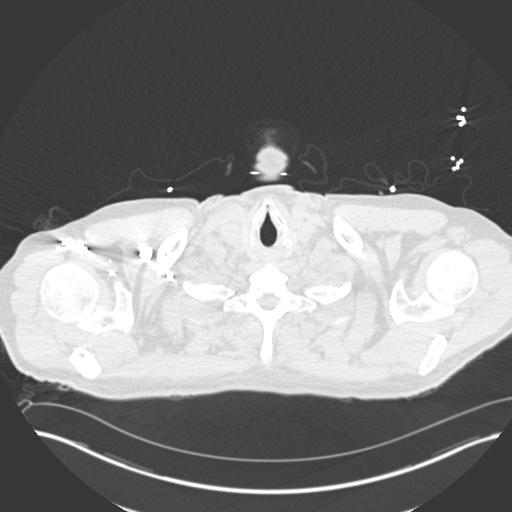

[Series 7: coronal mpr · coronal · 0.71mm/px · 3 of 135 slices shown]
[im 34/135  soft-tissue]
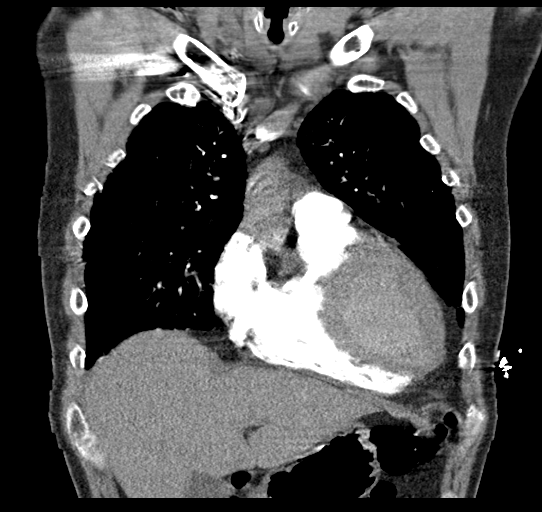
[im 68/135  soft-tissue]
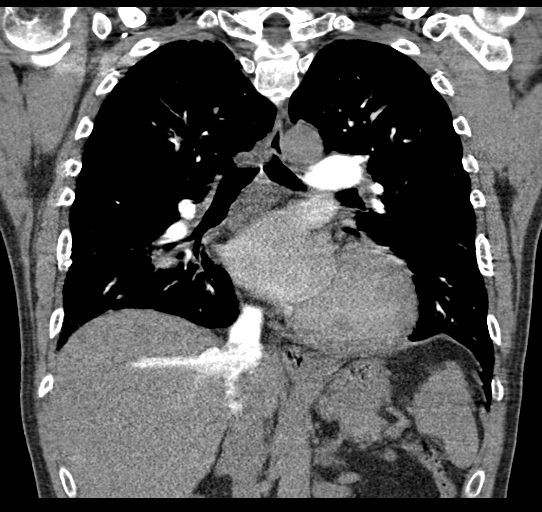
[im 101/135  soft-tissue]
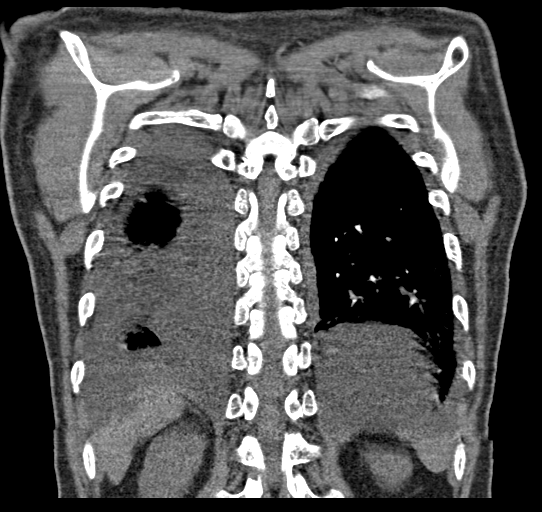

[18 of 46 positions shown; findings below may reference images not displayed]

FINDINGS: Cardiovascular: There Is no demonstrable pulmonary embolus. There is
no thoracic aortic aneurysm. No dissection is appreciable. Note that
the contrast bolus in the aorta is not sufficient for dissection
assessment. Visualized great vessels appear unremarkable. There is
no pericardial effusion or pericardial thickening. Heart is mildly
enlarged. There is felt to be a degree of pulmonary venous
hypertension.

Mediastinum/Nodes: Thyroid appears unremarkable. There are scattered
subcentimeter mediastinal lymph nodes. There is no appreciable
thoracic adenopathy by size criteria. A small amount of pericardial
fluid is noted in the subcarinal region, likely physiologic. No
esophageal lesions are evident.

Lungs/Pleura: There are free-flowing pleural effusions bilaterally,
larger on the right than on the left. There is a degree of
underlying centrilobular emphysema. There is atelectatic change in
the lung bases. There is interstitial edema bilaterally. There is no
frank airspace consolidation.

Upper Abdomen: There is reflux of contrast into the inferior vena
cava and hepatic veins. Visualized upper abdominal structures
otherwise appear unremarkable except for mild abdominal aortic
atherosclerosis.

Musculoskeletal: There are no blastic or lytic bone lesions. No
evident chest wall lesions.

Review of the MIP images confirms the above findings.
IMPRESSION: 1. No demonstrable pulmonary embolus. No thoracic aortic aneurysm.
No dissection evident; the contrast bolus in the aorta is not
sufficient for dissection assessment.

2. Heart mildly enlarged with probable degree of pulmonary venous
hypertension. Pleural effusions bilaterally, larger on the right
than on the left. There is evident interstitial edema. Question a
degree of underlying congestive heart failure.

3. Centrilobular emphysematous change. Areas of atelectatic change.
No frank airspace consolidation.

4.  No appreciable adenopathy.

5. Reflux of contrast into the inferior vena cava and hepatic veins,
a finding most likely indicative of a degree of increased right
heart pressure.

Emphysema ([id number]).

## 2021-05-12 ENCOUNTER — Encounter: Payer: Self-pay | Admitting: Family Medicine

## 2021-05-15 ENCOUNTER — Other Ambulatory Visit: Payer: Self-pay

## 2021-05-15 ENCOUNTER — Other Ambulatory Visit (HOSPITAL_COMMUNITY): Payer: Self-pay

## 2021-05-15 ENCOUNTER — Ambulatory Visit (HOSPITAL_COMMUNITY)
Admission: RE | Admit: 2021-05-15 | Discharge: 2021-05-15 | Disposition: A | Discharge status: Home / Self Care | Payer: Managed Care, Other (non HMO) | Source: Ambulatory Visit | Attending: Cardiology | Admitting: Cardiology

## 2021-05-15 DIAGNOSIS — I5022 Chronic systolic (congestive) heart failure: Secondary | ICD-10-CM | POA: Insufficient documentation

## 2021-05-15 LAB — BASIC METABOLIC PANEL WITH GFR
Anion gap: 5 (ref 5–15)
BUN: 15 mg/dL (ref 6–20)
CO2: 26 mmol/L (ref 22–32)
Calcium: 9.6 mg/dL (ref 8.9–10.3)
Chloride: 110 mmol/L (ref 98–111)
Creatinine, Ser: 0.65 mg/dL (ref 0.61–1.24)
GFR, Estimated: >60 mL/min (ref >60)
Glucose, Bld: 103 mg/dL — ABNORMAL HIGH (ref 70–99)
Potassium: 3.9 mmol/L (ref 3.5–5.1)
Sodium: 141 mmol/L (ref 135–145)

## 2021-05-15 MED ORDER — CLONAZEPAM 0.5 MG PO TABS: 0.2500 mg | ORAL_TABLET | Freq: Two times a day (BID) | ORAL | 2 refills | Status: DC | PRN

## 2021-05-15 MED ORDER — DAPAGLIFLOZIN PROPANEDIOL 10 MG PO TABS: ORAL_TABLET | ORAL | 1 refills | Status: DC

## 2021-08-16 ENCOUNTER — Other Ambulatory Visit: Payer: Self-pay | Admitting: Cardiovascular Disease

## 2021-08-16 ENCOUNTER — Other Ambulatory Visit (HOSPITAL_COMMUNITY): Payer: Self-pay | Admitting: Cardiology

## 2021-09-11 ENCOUNTER — Encounter: Payer: Self-pay | Admitting: Cardiovascular Disease

## 2021-09-11 ENCOUNTER — Other Ambulatory Visit (HOSPITAL_COMMUNITY): Payer: Self-pay

## 2021-09-11 MED ORDER — CARVEDILOL 25 MG PO TABS: 25.0000 mg | ORAL_TABLET | Freq: Two times a day (BID) | ORAL | 3 refills | Status: DC

## 2021-10-05 ENCOUNTER — Other Ambulatory Visit: Payer: Self-pay | Admitting: Cardiovascular Disease

## 2021-10-11 ENCOUNTER — Other Ambulatory Visit: Payer: Self-pay | Admitting: Cardiovascular Disease

## 2021-10-16 ENCOUNTER — Other Ambulatory Visit (HOSPITAL_COMMUNITY): Payer: Self-pay | Admitting: Cardiology

## 2021-10-19 ENCOUNTER — Other Ambulatory Visit: Payer: Self-pay | Admitting: Internal Medicine

## 2021-10-30 ENCOUNTER — Telehealth: Payer: Self-pay | Admitting: Family Medicine

## 2021-10-30 NOTE — Telephone Encounter (Signed)
Pt has appt scheduled.

## 2021-10-30 NOTE — Telephone Encounter (Signed)
He should have enough until his appt-- Dr. Hassan Rowan gave him 180 tablets on 05/15/21 with 2 refills -- please ask patient to check his bottle at home to make sure this info is accurate

## 2021-10-30 NOTE — Telephone Encounter (Signed)
Patient informed of the message below, will check the bottle and call back if needed.

## 2021-10-30 NOTE — Telephone Encounter (Signed)
Refill clonazePAM (KLONOPIN) 0.5 MG tablet   [id number] [place], [place] - [address] Phone:  [phone]  Fax:  [phone]

## 2021-10-31 NOTE — Telephone Encounter (Signed)
I will give him enough until the appointment only.

## 2021-10-31 NOTE — Telephone Encounter (Signed)
[facility] stating the patient requested a new refill on Clonazepam 0.5mg  as the Rx is due to be filled on 9/27 and the current Rx on file will expire on 9/22.  Pharmacy requested a new Rx to be filled on 9/27?  Message sent to Dr Casimiro Needle.

## 2021-10-31 NOTE — Telephone Encounter (Signed)
Noted  

## 2021-11-06 ENCOUNTER — Other Ambulatory Visit: Payer: Self-pay | Admitting: Cardiovascular Disease

## 2021-11-06 ENCOUNTER — Encounter: Payer: Self-pay | Admitting: Cardiovascular Disease

## 2021-11-06 MED ORDER — ATORVASTATIN CALCIUM 80 MG PO TABS: 80.0000 mg | ORAL_TABLET | Freq: Every day | ORAL | 0 refills | Status: DC

## 2021-11-07 MED ORDER — ATORVASTATIN CALCIUM 80 MG PO TABS: 80.0000 mg | ORAL_TABLET | Freq: Every day | ORAL | 0 refills | Status: DC

## 2021-11-07 NOTE — Progress Notes (Signed)
CARDIOLOGY CONSULT NOTE       [name] MRN: [id number] DOB/AGE: Sep 05, 1965 56 y.o.  Admit date: (Not on file) Referring Physician: Aundra Dubin Primary Physician: Caren Macadam, MD (Inactive) Primary Cardiologist: CHF/EP  Taylor/Klein/Mclean Reason for Consultation: CHF  Active Problems:   * No active hospital problems. *   HPI:  56 y.o. I have not seen in about 3 years. 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 [place] Plays in [place] 5 years with 3 step kids One in [place] one just graduated from [employer] in [place] one still at home  [place] continues with coreg, entresto, aldactone and dapagliflozin   Euvolemic with no CHF symptoms or edema  ROS All other systems reviewed and negative except as noted above  Past Medical History:  Diagnosis Date   AICD (automatic cardioverter/defibrillator) present    Anxiety    CHF (congestive heart failure) (HCC)    Gastric ulcer    GERD (gastroesophageal reflux disease)    Guillain Barr syndrome ([place]) 1984   COMPLETE RECOVERY; triggered from flu illness   Headache(784.0)    hx of tension-none recent   Hematemesis 09/07/2016   History of kidney stones 09/19/2012   left ureteral stone-SURGERY 8/6 URETEROSCOPY / STENT PLACEMENT    Partial gastric outlet obstruction    UGIB (upper gastrointestinal bleed) 09/07/2016    Family History  Problem Relation Age of Onset   Heart disease Mother     Congestive Heart Failure Mother    Ulcerative colitis Father    Hemachromatosis Father    Rheum arthritis Father    Heart disease Brother 63       stenting    Social History   Socioeconomic History   Marital status: Married    Spouse name: Not on file   Number of children: Not on file   Years of education: Not on file   Highest education level: Not on file  Occupational History   Not on file  Tobacco Use   Smoking status: Former    Packs/day: 1.50    Years: 28.00    Total pack years: 42.00    Types: Cigarettes    Quit date: 05/02/2018    Years since quitting: 3.5   Smokeless tobacco: Never  Vaping Use   Vaping Use: Never used  Substance and Sexual Activity   Alcohol use: Not Currently    Comment: occasional   Drug use: No    Types: Marijuana    Comment: in the past   Sexual activity: Not on file  Other Topics Concern   Not on file  Social History Narrative   Not on file   Social Determinants of Health   Financial Resource Strain: Not on file  Food Insecurity: Not on file  Transportation Needs: Not on file  Physical Activity: Not on file  Stress: Not on file  Social Connections: Not on file  Intimate Partner Violence: Not on file    Past Surgical History:  Procedure Laterality Date   BALLOON DILATION N/A 11/15/2016   Procedure: BALLOON DILATION;  Surgeon: Benancio Deeds, MD;  Location: [facility];  Service: Gastroenterology;  Laterality: N/A;   CORONARY STENT INTERVENTION N/A 01/21/2019   Procedure: CORONARY STENT INTERVENTION;  Surgeon: Kathleene Hazel, MD;  Location: [facility];  Service: Cardiovascular;  Laterality: N/A;   CYSTO, LEFT RGP, DIAGNOSTIC URETEROSCOPY AND STENT PLACEMENT  09/24/2012   CYSTOSCOPY WITH RETROGRADE PYELOGRAM, URETEROSCOPY AND STENT PLACEMENT Left 09/24/2012   Procedure: CYSTOSCOPY WITH RETROGRADE PYELOGRAM, DIAGNOSTIC URETEROSCOPY AND STENT PLACEMENT;  Surgeon: Sebastian Ache, MD;  Location: [facility];  Service:  Urology;  Laterality: Left;   CYSTOSCOPY WITH RETROGRADE PYELOGRAM, URETEROSCOPY AND STENT PLACEMENT Left 10/08/2012   Procedure: CYSTOSCOPY WITH RETROGRADE PYELOGRAM, URETEROSCOPY AND STENT PLACEMENT;  Surgeon: Sebastian Ache, MD;  Location: [facility];  Service: Urology;  Laterality: Left;  1 HR NEEDS DIGITAL URETEROSCOPE    ESOPHAGOGASTRODUODENOSCOPY N/A 09/08/2016   Procedure: ESOPHAGOGASTRODUODENOSCOPY (EGD);  Surgeon: Ruffin Frederick, MD;  Location: [facility];  Service: Gastroenterology;  Laterality: N/A;   ESOPHAGOGASTRODUODENOSCOPY N/A 11/15/2016   Procedure: ESOPHAGOGASTRODUODENOSCOPY (EGD);  Surgeon: Benancio Deeds, MD;  Location: [facility];  Service: Gastroenterology;  Laterality: N/A;   ESOPHAGOGASTRODUODENOSCOPY N/A 12/31/2016   Procedure: ESOPHAGOGASTRODUODENOSCOPY (EGD);  Surgeon: Benancio Deeds, MD;  Location: [facility];  Service: Gastroenterology;  Laterality: N/A;   ESOPHAGOGASTRODUODENOSCOPY (EGD) WITH PROPOFOL N/A 10/15/2016   Procedure: ESOPHAGOGASTRODUODENOSCOPY (EGD) WITH PROPOFOL; Duodenal dilation;  Surgeon: Ruffin Frederick, MD;  Location: [facility] ENDOSCOPY;  Service: Gastroenterology;  Laterality: N/A;   HOLMIUM LASER APPLICATION Left 10/08/2012   Procedure: HOLMIUM LASER APPLICATION;  Surgeon: Sebastian Ache, MD;  Location: [facility];  Service: Urology;  Laterality: Left;   ICD IMPLANT N/A 07/06/2019   Procedure: ICD IMPLANT;  Surgeon: Duke Salvia, MD;  Location: [facility]VASIVE CV LAB;  Service: Cardiovascular;  Laterality: N/A;   ICD LEAD REMOVAL N/A 09/19/2020   Procedure: ICD SYSTEM EXTRACTION;  Surgeon: Marinus Maw, MD;  Location: [facility] OR;  Service: Cardiovascular;  Laterality: N/A;   RIGHT/LEFT HEART CATH AND CORONARY ANGIOGRAPHY N/A 01/21/2019   Procedure: RIGHT/LEFT HEART CATH AND CORONARY ANGIOGRAPHY;  Surgeon: Kathleene Hazel, MD;  [facility];  Service: Cardiovascular;  Laterality: N/A;   SAVORY DILATION N/A  12/31/2016   Procedure: POSSIBLE SAVORY DILATION;  Surgeon: Benancio Deeds, MD;  Location: [facility];  Service: Gastroenterology;  Laterality: N/A;   TEE WITHOUT CARDIOVERSION  09/19/2020   Procedure: TRANSESOPHAGEAL ECHOCARDIOGRAM (TEE);  Surgeon: Marinus Maw, MD;  [facility];  Service: Cardiovascular;;   TONSILLECTOMY     t&a as a child   wisdom teeth extracted        Current Outpatient Medications:    acetaminophen (TYLENOL) 500 MG tablet, Take 1,000 mg 2 (two) times daily as needed by mouth for moderate pain., Disp: , Rfl:    aspirin EC (ASPIRIN LOW DOSE) 81 MG tablet, Take 1 tablet (81 mg total) by mouth daily. SWALLOW WHOLE.NEEDS FOLLOW UP APPOINTMENT FOR MORE REFILLS, Disp: 90 tablet, Rfl: 0   atorvastatin (LIPITOR) 80 MG tablet, Take 1 tablet (80 mg total) by mouth daily., Disp: 90 tablet, Rfl: 0   carvedilol (COREG) 25 MG tablet, Take 1 tablet (25 mg total) by mouth 2 (two) times daily with a meal., Disp: 180 tablet, Rfl: 3   clonazePAM (KLONOPIN) 0.5 MG tablet, Take  0.5-1 tablets (0.25-0.5 mg total) by mouth 2 (two) times daily as needed for anxiety., Disp: 180 tablet, Rfl: 2   dapagliflozin propanediol (FARXIGA) 10 MG TABS tablet, Take 1 tablet (10 mg total) by mouth daily. NEEDS FOLLOW UP APPOINTMENT FOR ANYMORE REFILLS, Disp: 90 tablet, Rfl: 0   MELATONIN PO, Take 1 tablet by mouth at bedtime as needed (sleep)., Disp: , Rfl:    Multiple Vitamins-Minerals (MULTIVITAMIN WITH MINERALS) tablet, Take 1 tablet by mouth 3 (three) times a week., Disp: , Rfl:    omeprazole (PRILOSEC OTC) 20 MG tablet, Take 20 mg by mouth daily., Disp: , Rfl:    sacubitril-valsartan (ENTRESTO) 97-103 MG, TAKE 1 TABLET BY MOUTH TWICE A DAY, Disp: 60 tablet, Rfl: 0   spironolactone (ALDACTONE) 25 MG tablet, Take 1 tablet (25 mg total) by mouth daily., Disp: 90 tablet, Rfl: 3    Physical Exam: Blood pressure 114/68, pulse 70, height 6' (1.829 m), weight 188 lb 12.8 oz (85.6 kg), SpO2 95 %.    Affect appropriate Healthy:  appears stated age HEENT: normal Neck supple with no adenopathy JVP normal no bruits no thyromegaly Lungs clear with no wheezing and good diaphragmatic motion Heart:  S1/S2 no murmur, no rub, gallop or click PMI normal Previous AICD pocket under left subclavian Abdomen: benighn, BS positve, no tenderness, no AAA no bruit.  No HSM or HJR Distal pulses intact with no bruits No edema Neuro non-focal Skin warm and dry No muscular weakness   Labs:   Lab Results  Component Value Date   WBC 10.7 08/25/2020   HGB 14.8 08/25/2020   HCT 43.4 08/25/2020   MCV 95 08/25/2020   PLT 297 08/25/2020   No results for input(s): "NA", "K", "CL", "CO2", "BUN", "CREATININE", "CALCIUM", "PROT", "BILITOT", "ALKPHOS", "ALT", "AST", "GLUCOSE" in the last 168 hours.  Invalid input(s): "LABALBU" Lab Results  Component Value Date   TROPONINI <0.03 06/06/2018    Lab Results  Component Value Date   CHOL 139 10/14/2020   CHOL 149 02/01/2020   CHOL 133 11/13/2019   Lab Results  Component Value Date   HDL 36 (L) 10/14/2020   HDL 47 02/01/2020   HDL 41 11/13/2019   Lab Results  Component Value Date   LDLCALC 69 10/14/2020   LDLCALC 78 02/01/2020   LDLCALC 68 11/13/2019   Lab Results  Component Value Date   TRIG 170 (H) 10/14/2020   TRIG 141 02/01/2020   TRIG 121 11/13/2019   Lab Results  Component Value Date   CHOLHDL 3.9 10/14/2020   CHOLHDL 3.2 02/01/2020   CHOLHDL 3.2 11/13/2019   No results found for: "LDLDIRECT"    Radiology: No results found.  EKG: SR rate 77 Q waves 3,F 10/25/20  11/20/2021 NSR rate 70 normal    ASSESSMENT AND PLAN:   DCM:  Mixed with some CAD and improvement to EF 40% on GDMT. Update TTE continue current meds Functional class 1 CAD:  01/21/19 stenting of LCX/OM2 and distal RCA/PLB continue ASA and statin  AIDC:  post explant infected device  GERD: continue low carb diet and prilosec  TTE  F/U in 6 months    Signed: Charlton Haws 11/20/2021, 9:00 AM

## 2021-11-17 ENCOUNTER — Encounter: Payer: Self-pay | Admitting: Family Medicine

## 2021-11-20 ENCOUNTER — Encounter: Payer: Self-pay | Admitting: Cardiovascular Disease

## 2021-11-20 ENCOUNTER — Ambulatory Visit: Payer: Managed Care, Other (non HMO) | Attending: Cardiovascular Disease | Admitting: Cardiovascular Disease

## 2021-11-20 VITALS — BP 114/68 | HR 70 | Ht 72.0 in | Wt 188.8 lb

## 2021-11-20 DIAGNOSIS — I255 Ischemic cardiomyopathy: Secondary | ICD-10-CM

## 2021-11-20 DIAGNOSIS — I5022 Chronic systolic (congestive) heart failure: Secondary | ICD-10-CM

## 2021-11-20 DIAGNOSIS — I428 Other cardiomyopathies: Secondary | ICD-10-CM | POA: Diagnosis not present

## 2021-11-20 MED ORDER — SPIRONOLACTONE 25 MG PO TABS: 25.0000 mg | ORAL_TABLET | Freq: Every day | ORAL | 3 refills | Status: DC

## 2021-11-20 NOTE — Patient Instructions (Addendum)
Medication Instructions:  Your physician recommends that you continue on your current medications as directed. Please refer to the Current Medication list given to you today.     *If you need a refill on your cardiac medications before your next appointment, please call your pharmacy*  Lab Work: If you have labs (blood work) drawn today and your tests are completely normal, you will receive your results only by: MyChart Message (if you have MyChart) OR A paper copy in the mail If you have any lab test that is abnormal or we need to change your treatment, we will call you to review the results.  Testing/Procedures: Your physician has requested that you have an echocardiogram. Echocardiography is a painless test that uses sound waves to create images of your heart. It provides your doctor with information about the size and shape of your heart and how well your heart's chambers and valves are working. This procedure takes approximately one hour. There are no restrictions for this procedure.  Follow-Up: At [facility], you and your health needs are our priority.  As part of our continuing mission to provide you with exceptional heart care, we have created designated Provider Care Teams.  These Care Teams include your primary Cardiologist (physician) and Advanced Practice Providers (APPs -  Physician Assistants and Nurse Practitioners) who all work together to provide you with the care you need, when you need it.  We recommend signing up for the patient portal called "MyChart".  Sign up information is provided on this After Visit Summary.  MyChart is used to connect with patients for Virtual Visits (Telemedicine).  Patients are able to view lab/test results, encounter notes, upcoming appointments, etc.  Non-urgent messages can be sent to your provider as well.   To learn more about what you can do with MyChart, go to [email or web address].    Your next appointment:   6 months  The  format for your next appointment:   In Person  Provider:   Peter Nishan, MD     Important Information About Sugar       

## 2021-11-27 ENCOUNTER — Ambulatory Visit: Payer: Managed Care, Other (non HMO) | Admitting: Family Medicine

## 2021-11-27 VITALS — BP 116/60 | HR 83 | Temp 97.5°F | Ht 72.0 in | Wt 191.7 lb

## 2021-11-27 DIAGNOSIS — R3911 Hesitancy of micturition: Secondary | ICD-10-CM

## 2021-11-27 DIAGNOSIS — F419 Anxiety disorder, unspecified: Secondary | ICD-10-CM

## 2021-11-27 DIAGNOSIS — I5022 Chronic systolic (congestive) heart failure: Secondary | ICD-10-CM

## 2021-11-27 DIAGNOSIS — N401 Enlarged prostate with lower urinary tract symptoms: Secondary | ICD-10-CM | POA: Insufficient documentation

## 2021-11-27 MED ORDER — FLUOXETINE HCL 10 MG PO TABS: 10.0000 mg | ORAL_TABLET | Freq: Every day | ORAL | 0 refills | Status: DC

## 2021-11-27 MED ORDER — TAMSULOSIN HCL 0.4 MG PO CAPS: 0.4000 mg | ORAL_CAPSULE | Freq: Every day | ORAL | 0 refills | Status: DC

## 2021-11-27 MED ORDER — CLONAZEPAM 0.5 MG PO TABS: 0.2500 mg | ORAL_TABLET | Freq: Two times a day (BID) | ORAL | 0 refills | Status: DC | PRN

## 2021-11-27 NOTE — Progress Notes (Unsigned)
Established Patient Office Visit  Subjective   [name], male    DOB: [birth year]  Age: 56 y.o. MRN: [id number]  Chief Complaint  Patient presents with   Transitions Of Care    Patient is here for transition of care and follow up.  Patient has a long heart history, sees a Dr. Eden Emms for this, just recently saw him last week. States that he is going soon for another ECHO. States that his cardiologist normally follows his lab work. States that his weight is stable, he denies any SOB, orthopnea, or leg swelling.   Anxiety - klonopin has been prescribed for this patient for about 3 years, states his anxiety started around the time that he started having his heart problems. States that he has been tried on buspirone (made him feel like a zombie), also tried sertraline and lexapro, states he had severe diarrhea with the sertraline. We had a long discussion about the treatment of GAD and what other treatment modalities we could try. We discussed that benzo's are not recommended for long term monotherapy treatment as they increase the risk for dependency, tolerance and withdrawal symptoms.   BPH-- pt reports he is noticing that his stream is not as strong, states that he isn't really emptying his bladder. State he wakes several times at night, sometimes there is just dribbling, not a full bladder emptying.    Current Outpatient Medications  Medication Instructions   acetaminophen (TYLENOL) 1,000 mg, Oral, 2 times daily PRN   aspirin EC (ASPIRIN LOW DOSE) 81 mg, Oral, Daily, SWALLOW WHOLE.NEEDS FOLLOW UP APPOINTMENT FOR MORE REFILLS   atorvastatin (LIPITOR) 80 mg, Oral, Daily   carvedilol (COREG) 25 mg, Oral, 2 times daily with meals   clonazePAM (KLONOPIN) 0.25-0.5 mg, Oral, 2 times daily PRN   dapagliflozin propanediol (FARXIGA) 10 mg, Oral, Daily, NEEDS FOLLOW UP APPOINTMENT FOR ANYMORE REFILLS   FLUoxetine (PROZAC) 10 mg, Oral, Daily   Multiple Vitamins-Minerals  (MULTIVITAMIN WITH MINERALS) tablet 1 tablet, Oral, 3 times weekly   omeprazole (PRILOSEC OTC) 20 mg, Oral, Daily   sacubitril-valsartan (ENTRESTO) 97-103 MG TAKE 1 TABLET BY MOUTH TWICE A DAY   spironolactone (ALDACTONE) 25 mg, Oral, Daily   tamsulosin (FLOMAX) 0.4 mg, Oral, Daily    Patient Active Problem List   Diagnosis Date Noted   Benign prostatic hyperplasia with hesitancy 11/27/2021   ICD (implantable cardioverter-defibrillator) infection (HCC) 09/19/2020   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   Ischemic cardiomyopathy 10/31/2019   Non-ischemic cardiomyopathy (HCC) 10/31/2019   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   Chronic systolic congestive heart failure (HCC)    Unstable angina (HCC)    History of Guillain-Barre syndrome 12/19/2018   Acute systolic heart failure (HCC) 06/11/2018   Acute respiratory failure with hypoxia (HCC) 06/05/2018   Abnormal EKG 06/05/2018   Anxiety 06/05/2018   Duodenal stenosis    Gastric ulcer without hemorrhage or perforation    Iron deficiency anemia 09/25/2016   Peptic ulcer of stomach 09/25/2016   Duodenal ulcer 09/25/2016   NSAID long-term use       Review of Systems  Constitutional:  Negative for chills and fever.  Genitourinary:  Positive for frequency. Negative for dysuria, hematuria and urgency.  Psychiatric/Behavioral:  The patient is nervous/anxious.   All other systems reviewed and are negative.     Objective:     BP 116/60 (BP Location: Left Arm, Patient Position: Sitting, Cuff Size: Large)   Pulse 83   Temp Marland Kitchen)  97.5 F (36.4 C) (Oral)   Ht 6' (1.829 m)   Wt 191 lb 11.2 oz (87 kg)   SpO2 97%   BMI 26.00 kg/m    Physical Exam Vitals reviewed.  Constitutional:      Appearance: Normal appearance. He is well-groomed and normal weight.  Eyes:     Extraocular Movements: Extraocular movements intact.     Conjunctiva/sclera: Conjunctivae normal.  Neck:     Thyroid: No thyromegaly.   Cardiovascular:     Rate and Rhythm: Normal rate and regular rhythm.     Heart sounds: S1 normal and S2 normal. No murmur heard. Pulmonary:     Effort: Pulmonary effort is normal.     Breath sounds: Normal breath sounds and air entry. No rales.  Abdominal:     General: Abdomen is flat. Bowel sounds are normal.  Musculoskeletal:     Right lower leg: No edema.     Left lower leg: No edema.  Neurological:     General: No focal deficit present.     Mental Status: He is alert and oriented to person, place, and time.     Gait: Gait is intact.  Psychiatric:        Mood and Affect: Mood and affect normal.      No results found for any visits on 11/27/21.    The 10-year ASCVD risk score (Arnett DK, et al., 2019) is: 3.8%    Assessment & Plan:   Problem List Items Addressed This Visit       Cardiovascular and Mediastinum   Chronic systolic congestive heart failure ([place])    Secondary to CAD, this is well controlled on the entresto, there is no sign of fluid overload on exam today, he is supposed to have a repeat ECHO soon to determine if he needs the ICD re-implanted. Will continue to follow along with cardiology who is managing this for him.        Genitourinary   Benign prostatic hyperplasia with hesitancy    New symptoms, we discussed the medication that are commonly used, will start with flomax 0.4 mg daily and reassess his symptoms at the next visit. If he does not improve with this and adding finasteride then will refer him to urology      Relevant Medications   tamsulosin (FLOMAX) 0.4 MG CAPS capsule     Other   Anxiety - Primary    We had a long discussion about the high risk nature of benzodiazepines, I recommended that we start a low dose fluoxetine once daily and increase the fluoxetine as tolerated until his anxiety is under good control. Then we can very gradually start weaning the klonazepam with the goal of eventually stopping the medication. We discussed that  we will need to see him every 3 months during this process to reassess his anxiety levels and adjust medications.       Relevant Medications   clonazePAM (KLONOPIN) 0.5 MG tablet   FLUoxetine (PROZAC) 10 MG tablet    Return in about 3 months (around 02/27/2022) for video visit for medication refills.  I spent 35 minutes with the patient today reviewing his cardiology notes, discussing the anxiety and medications for this, and reviewing his new symptoms of BPH.  Farrel Conners, MD

## 2021-11-28 NOTE — Assessment & Plan Note (Signed)
Secondary to CAD, this is well controlled on the entresto, there is no sign of fluid overload on exam today, he is supposed to have a repeat ECHO soon to determine if he needs the ICD re-implanted. Will continue to follow along with cardiology who is managing this for him.

## 2021-11-28 NOTE — Assessment & Plan Note (Signed)
New symptoms, we discussed the medication that are commonly used, will start with flomax 0.4 mg daily and reassess his symptoms at the next visit. If he does not improve with this and adding finasteride then will refer him to urology

## 2021-11-28 NOTE — Assessment & Plan Note (Signed)
We had a long discussion about the high risk nature of benzodiazepines, I recommended that we start a low dose fluoxetine once daily and increase the fluoxetine as tolerated until his anxiety is under good control. Then we can very gradually start weaning the klonazepam with the goal of eventually stopping the medication. We discussed that we will need to see him every 3 months during this process to reassess his anxiety levels and adjust medications.

## 2021-12-08 ENCOUNTER — Ambulatory Visit (HOSPITAL_COMMUNITY): Payer: Managed Care, Other (non HMO) | Attending: Cardiovascular Disease

## 2021-12-08 DIAGNOSIS — I428 Other cardiomyopathies: Secondary | ICD-10-CM | POA: Diagnosis present

## 2021-12-08 LAB — ECHOCARDIOGRAM COMPLETE
Area-P 1/2: 3.31 cm2
S' Lateral: 4 cm

## 2022-01-01 ENCOUNTER — Other Ambulatory Visit (HOSPITAL_COMMUNITY): Payer: Self-pay | Admitting: Cardiology

## 2022-01-08 ENCOUNTER — Other Ambulatory Visit (HOSPITAL_COMMUNITY): Payer: Self-pay | Admitting: Cardiology

## 2022-01-08 ENCOUNTER — Other Ambulatory Visit: Payer: Self-pay | Admitting: Family Medicine

## 2022-01-08 DIAGNOSIS — N401 Enlarged prostate with lower urinary tract symptoms: Secondary | ICD-10-CM

## 2022-01-08 DIAGNOSIS — F419 Anxiety disorder, unspecified: Secondary | ICD-10-CM

## 2022-01-15 ENCOUNTER — Other Ambulatory Visit: Payer: Self-pay | Admitting: Internal Medicine

## 2022-02-03 ENCOUNTER — Other Ambulatory Visit: Payer: Self-pay | Admitting: Cardiovascular Disease

## 2022-02-04 ENCOUNTER — Other Ambulatory Visit (HOSPITAL_COMMUNITY): Payer: Self-pay | Admitting: Cardiology

## 2022-02-12 ENCOUNTER — Other Ambulatory Visit: Payer: Self-pay | Admitting: Internal Medicine

## 2022-03-03 ENCOUNTER — Other Ambulatory Visit (HOSPITAL_COMMUNITY): Payer: Self-pay | Admitting: Cardiology

## 2022-04-02 ENCOUNTER — Other Ambulatory Visit: Payer: Self-pay | Admitting: Family Medicine

## 2022-04-02 ENCOUNTER — Encounter: Payer: Self-pay | Admitting: Family Medicine

## 2022-04-02 DIAGNOSIS — F419 Anxiety disorder, unspecified: Secondary | ICD-10-CM

## 2022-04-03 ENCOUNTER — Other Ambulatory Visit (HOSPITAL_COMMUNITY): Payer: Self-pay | Admitting: Cardiology

## 2022-04-03 MED ORDER — CLONAZEPAM 0.5 MG PO TABS: 0.2500 mg | ORAL_TABLET | Freq: Every day | ORAL | 0 refills | Status: DC | PRN

## 2022-04-03 MED ORDER — FLUOXETINE HCL 20 MG PO TABS: 20.0000 mg | ORAL_TABLET | Freq: Every day | ORAL | 1 refills | Status: DC

## 2022-04-29 ENCOUNTER — Other Ambulatory Visit (HOSPITAL_COMMUNITY): Payer: Self-pay | Admitting: Cardiology

## 2022-05-03 ENCOUNTER — Other Ambulatory Visit: Payer: Self-pay | Admitting: Cardiovascular Disease

## 2022-05-07 NOTE — Progress Notes (Signed)
CARDIOLOGY CONSULT NOTE       [name] MRN: [id number] DOB/AGE: 55-Jun-1967 55 y.o.  Admit date: (Not on file) Referring Physician: Aundra Dubin Primary Physician: Farrel Conners, MD Primary Cardiologist: CHF/EP  Taylor/Klein/Mclean Reason for Consultation: CHF    HPI:  57 y.o. 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[place] including [place], [place] and Insurance claims handler belt products Plays in [place] 6 years with 3 step kids One in [place] one graduated from [employer] in [place] one still at home  [place] continues with coreg, entresto, aldactone and dapagliflozin   Euvolemic with no CHF symptoms or edema  TTE 12/08/21  EF 35-40% mild MR aortic root 3.9 cm   Euvolemic with no dyspnea Compliance with meds is great   ROS All other systems reviewed and negative except as noted above  Past Medical History:  Diagnosis Date   AICD (automatic cardioverter/defibrillator) present    Anxiety    CHF (congestive heart failure) ([place])    Gastric ulcer    GERD (gastroesophageal reflux disease)    Guillain Barr syndrome ([place]) 1984   COMPLETE RECOVERY; triggered from flu illness   Headache(784.0)    hx of tension-none recent   Hematemesis 09/07/2016   History of kidney stones 09/19/2012   left ureteral stone-SURGERY 8/6 URETEROSCOPY / STENT PLACEMENT    Partial gastric outlet obstruction    UGIB (upper gastrointestinal bleed) 09/07/2016     Family History  Problem Relation Age of Onset   Heart disease Mother    Congestive Heart Failure Mother    Ulcerative colitis Father    Hemachromatosis Father    Rheum arthritis Father    Heart disease Brother 39       stenting    Social History   Socioeconomic History   Marital status: Married    Spouse name: Not on file   Number of children: Not on file   Years of education: Not on file   Highest education level: Not on file  Occupational History   Not on file  Tobacco Use   Smoking status: Former    Packs/day: 1.50    Years: 28.00    Additional pack years: 0.00    Total pack years: 42.00    Types: Cigarettes    Quit date: 05/02/2018    Years since quitting: 4.0   Smokeless tobacco: Never  Vaping Use   Vaping Use: Never used  Substance and Sexual Activity   Alcohol use: Not Currently    Comment: occasional   Drug use: No    Types: Marijuana    Comment: in the past   Sexual activity: Not on file  Other Topics Concern   Not on file  Social History Narrative   Not on file   Social Determinants of Health   Financial Resource Strain: Not on file  Food Insecurity: Not on file  Transportation Needs: Not  on file  Physical Activity: Not on file  Stress: Not on file  Social Connections: Not on file  Intimate Partner Violence: Not on file    Past Surgical History:  Procedure Laterality Date   BALLOON DILATION N/A 11/15/2016   Procedure: BALLOON DILATION;  Surgeon: Yetta Flock, MD;  Location: [facility];  Service: Gastroenterology;  Laterality: N/A;   CORONARY STENT INTERVENTION N/A 01/21/2019   Procedure: CORONARY STENT INTERVENTION;  Surgeon: Burnell Blanks, MD;  Location: [facility];  Service: Cardiovascular;  Laterality: N/A;   CYSTO, LEFT RGP, DIAGNOSTIC URETEROSCOPY AND STENT PLACEMENT  09/24/2012   CYSTOSCOPY WITH RETROGRADE PYELOGRAM, URETEROSCOPY AND STENT PLACEMENT Left 09/24/2012   Procedure: CYSTOSCOPY WITH RETROGRADE PYELOGRAM,  DIAGNOSTIC URETEROSCOPY AND STENT PLACEMENT;  Surgeon: Alexis Frock, MD;  Location: [facility];  Service: Urology;  Laterality: Left;   CYSTOSCOPY WITH RETROGRADE PYELOGRAM, URETEROSCOPY AND STENT PLACEMENT Left 10/08/2012   Procedure: CYSTOSCOPY WITH RETROGRADE PYELOGRAM, URETEROSCOPY AND STENT PLACEMENT;  Surgeon: Alexis Frock, MD;  Location: [facility];  Service: Urology;  Laterality: Left;  1 HR NEEDS DIGITAL URETEROSCOPE    ESOPHAGOGASTRODUODENOSCOPY N/A 09/08/2016   Procedure: ESOPHAGOGASTRODUODENOSCOPY (EGD);  Surgeon: Manus Gunning, MD;  Location: [facility];  Service: Gastroenterology;  Laterality: N/A;   ESOPHAGOGASTRODUODENOSCOPY N/A 11/15/2016   Procedure: ESOPHAGOGASTRODUODENOSCOPY (EGD);  Surgeon: Yetta Flock, MD;  Location: [facility];  Service: Gastroenterology;  Laterality: N/A;   ESOPHAGOGASTRODUODENOSCOPY N/A 12/31/2016   Procedure: ESOPHAGOGASTRODUODENOSCOPY (EGD);  Surgeon: Yetta Flock, MD;  Location: [facility];  Service: Gastroenterology;  Laterality: N/A;   ESOPHAGOGASTRODUODENOSCOPY (EGD) WITH PROPOFOL N/A 10/15/2016   Procedure: ESOPHAGOGASTRODUODENOSCOPY (EGD) WITH PROPOFOL; Duodenal dilation;  Surgeon: Manus Gunning, MD;  Location: [facility];  Service: Gastroenterology;  Laterality: N/A;   HOLMIUM LASER APPLICATION Left 0000000   Procedure: HOLMIUM LASER APPLICATION;  Surgeon: Alexis Frock, MD;  Location: [facility];  Service: Urology;  Laterality: Left;   ICD IMPLANT N/A 07/06/2019   Procedure: ICD IMPLANT;  Surgeon: Deboraha Sprang, MD;  Location: [facility];  Service: Cardiovascular;  Laterality: N/A;   ICD LEAD REMOVAL N/A 09/19/2020   Procedure: ICD SYSTEM EXTRACTION;  Surgeon: Evans Lance, MD;  Location: [facility] OR;  Service: Cardiovascular;  Laterality: N/A;   RIGHT/LEFT HEART CATH AND CORONARY ANGIOGRAPHY N/A 01/21/2019   Procedure: RIGHT/LEFT HEART CATH AND CORONARY ANGIOGRAPHY;  Surgeon: Burnell Blanks, MD;   Location: [facility];  Service: Cardiovascular;  Laterality: N/A;   SAVORY DILATION N/A 12/31/2016   Procedure: POSSIBLE SAVORY DILATION;  Surgeon: Yetta Flock, MD;  Location: [facility];  Service: Gastroenterology;  Laterality: N/A;   TEE WITHOUT CARDIOVERSION  09/19/2020   Procedure: TRANSESOPHAGEAL ECHOCARDIOGRAM (TEE);  Surgeon: Evans Lance, MD;  Location: [facility] OR;  Service: Cardiovascular;;   TONSILLECTOMY     t&a as a child   wisdom teeth extracted        Current Outpatient Medications:    acetaminophen (TYLENOL) 500 MG tablet, Take 1,000 mg 2 (two) times daily as needed by mouth for moderate pain., Disp: , Rfl:    ASPIRIN LOW DOSE 81 MG tablet, TAKE 1 TABLET BY MOUTH EVERY DAY, Disp: 90 tablet, Rfl: 0   atorvastatin (LIPITOR) 80 MG tablet, TAKE 1 TABLET BY MOUTH EVERY DAY, Disp: 90 tablet, Rfl: 0   carvedilol (COREG) 25 MG tablet, Take 1 tablet (25 mg total) by mouth 2 (two) times daily with a meal., Disp: 180 tablet, Rfl: 3  clonazePAM (KLONOPIN) 0.5 MG tablet, Take 0.5-1 tablets (0.25-0.5 mg total) by mouth daily as needed for anxiety., Disp: 90 tablet, Rfl: 0   FARXIGA 10 MG TABS tablet, TAKE 1 TABLET DAILY, Disp: 30 tablet, Rfl: 0   FLUoxetine (PROZAC) 20 MG tablet, Take 1 tablet (20 mg total) by mouth daily., Disp: 90 tablet, Rfl: 1   Multiple Vitamins-Minerals (MULTIVITAMIN WITH MINERALS) tablet, Take 1 tablet by mouth 3 (three) times a week., Disp: , Rfl:    omeprazole (PRILOSEC OTC) 20 MG tablet, Take 20 mg by mouth daily., Disp: , Rfl:    sacubitril-valsartan (ENTRESTO) 97-103 MG, TAKE 1 TABLET BY MOUTH TWICE A DAY, Disp: 180 tablet, Rfl: 2   spironolactone (ALDACTONE) 25 MG tablet, Take 1 tablet (25 mg total) by mouth daily., Disp: 90 tablet, Rfl: 3   tamsulosin (FLOMAX) 0.4 MG CAPS capsule, TAKE 1 CAPSULE BY MOUTH EVERY DAY, Disp: 90 capsule, Rfl: 0    Physical Exam: Blood pressure 124/60, pulse 70, height 6' (1.829 m), weight 188 lb 9.6 oz (85.5 kg),  SpO2 97 %.   Affect appropriate Healthy:  appears stated age 29: normal Neck supple with no adenopathy JVP normal no bruits no thyromegaly Lungs clear with no wheezing and good diaphragmatic motion Heart:  S1/S2 no murmur, no rub, gallop or click PMI normal Previous AICD pocket under left subclavian Abdomen: benighn, BS positve, no tenderness, no AAA no bruit.  No HSM or HJR Distal pulses intact with no bruits No edema Neuro non-focal Skin warm and dry No muscular weakness   Labs:   Lab Results  Component Value Date   WBC 10.7 08/25/2020   HGB 14.8 08/25/2020   HCT 43.4 08/25/2020   MCV 95 08/25/2020   PLT 297 08/25/2020   No results for input(s): "NA", "K", "CL", "CO2", "BUN", "CREATININE", "CALCIUM", "PROT", "BILITOT", "ALKPHOS", "ALT", "AST", "GLUCOSE" in the last 168 hours.  Invalid input(s): "LABALBU" Lab Results  Component Value Date   TROPONINI <0.03 06/06/2018    Lab Results  Component Value Date   CHOL 139 10/14/2020   CHOL 149 02/01/2020   CHOL 133 11/13/2019   Lab Results  Component Value Date   HDL 36 (L) 10/14/2020   HDL 47 02/01/2020   HDL 41 11/13/2019   Lab Results  Component Value Date   LDLCALC 69 10/14/2020   LDLCALC 78 02/01/2020   LDLCALC 68 11/13/2019   Lab Results  Component Value Date   TRIG 170 (H) 10/14/2020   TRIG 141 02/01/2020   TRIG 121 11/13/2019   Lab Results  Component Value Date   CHOLHDL 3.9 10/14/2020   CHOLHDL 3.2 02/01/2020   CHOLHDL 3.2 11/13/2019   No results found for: "LDLDIRECT"    Radiology: No results found.  EKG: SR rate 77 Q waves 3,F 10/25/20  05/14/2022 NSR rate 70 normal    ASSESSMENT AND PLAN:   DCM:  Mixed with some CAD and improvement to EF 35-40%  Functional class 1 continue current medication  CAD:  01/21/19 stenting of LCX/OM2 and distal RCA/PLB continue ASA and statin  AIDC:  post explant infected device  GERD: continue low carb diet and prilosec   F/U in 6 months    Signed: Jenkins Rouge 05/14/2022, 9:47 AM

## 2022-05-14 ENCOUNTER — Ambulatory Visit: Payer: Managed Care, Other (non HMO) | Attending: Cardiovascular Disease | Admitting: Cardiovascular Disease

## 2022-05-14 ENCOUNTER — Encounter: Payer: Self-pay | Admitting: Cardiovascular Disease

## 2022-05-14 VITALS — BP 124/60 | HR 70 | Ht 72.0 in | Wt 188.6 lb

## 2022-05-14 DIAGNOSIS — T827XXA Infection and inflammatory reaction due to other cardiac and vascular devices, implants and grafts, initial encounter: Secondary | ICD-10-CM

## 2022-05-14 DIAGNOSIS — I5022 Chronic systolic (congestive) heart failure: Secondary | ICD-10-CM | POA: Diagnosis not present

## 2022-05-14 DIAGNOSIS — I255 Ischemic cardiomyopathy: Secondary | ICD-10-CM | POA: Diagnosis not present

## 2022-05-14 DIAGNOSIS — E785 Hyperlipidemia, unspecified: Secondary | ICD-10-CM

## 2022-05-14 MED ORDER — ENTRESTO 97-103 MG PO TABS: 1.0000 | ORAL_TABLET | Freq: Two times a day (BID) | ORAL | 2 refills | Status: DC

## 2022-05-14 MED ORDER — SPIRONOLACTONE 25 MG PO TABS: 25.0000 mg | ORAL_TABLET | Freq: Every day | ORAL | 3 refills | Status: DC

## 2022-05-14 NOTE — Patient Instructions (Signed)
Medication Instructions:  Your physician recommends that you continue on your current medications as directed. Please refer to the Current Medication list given to you today.  *If you need a refill on your cardiac medications before your next appointment, please call your pharmacy*  Lab Work: If you have labs (blood work) drawn today and your tests are completely normal, you will receive your results only by: MyChart Message (if you have MyChart) OR A paper copy in the mail If you have any lab test that is abnormal or we need to change your treatment, we will call you to review the results.  Testing/Procedures: None ordered today.  Follow-Up: At [facility], you and your health needs are our priority.  As part of our continuing mission to provide you with exceptional heart care, we have created designated Provider Care Teams.  These Care Teams include your primary Cardiologist (physician) and Advanced Practice Providers (APPs -  Physician Assistants and Nurse Practitioners) who all work together to provide you with the care you need, when you need it.  We recommend signing up for the patient portal called "MyChart".  Sign up information is provided on this After Visit Summary.  MyChart is used to connect with patients for Virtual Visits (Telemedicine).  Patients are able to view lab/test results, encounter notes, upcoming appointments, etc.  Non-urgent messages can be sent to your provider as well.   To learn more about what you can do with MyChart, go to [email or web address].    Your next appointment:   1 year   Provider:   Peter Nishan, MD     

## 2022-05-20 ENCOUNTER — Other Ambulatory Visit: Payer: Self-pay | Admitting: Family Medicine

## 2022-05-20 DIAGNOSIS — N401 Enlarged prostate with lower urinary tract symptoms: Secondary | ICD-10-CM

## 2022-05-20 DIAGNOSIS — F419 Anxiety disorder, unspecified: Secondary | ICD-10-CM

## 2022-05-30 ENCOUNTER — Other Ambulatory Visit (HOSPITAL_COMMUNITY): Payer: Self-pay | Admitting: Cardiology

## 2022-06-04 ENCOUNTER — Ambulatory Visit: Payer: Managed Care, Other (non HMO) | Admitting: Family Medicine

## 2022-06-04 ENCOUNTER — Encounter: Payer: Self-pay | Admitting: Family Medicine

## 2022-06-04 VITALS — BP 100/78 | HR 99 | Temp 97.6°F | Ht 72.0 in | Wt 183.4 lb

## 2022-06-04 DIAGNOSIS — D509 Iron deficiency anemia, unspecified: Secondary | ICD-10-CM | POA: Diagnosis not present

## 2022-06-04 DIAGNOSIS — F419 Anxiety disorder, unspecified: Secondary | ICD-10-CM | POA: Diagnosis not present

## 2022-06-04 DIAGNOSIS — I5022 Chronic systolic (congestive) heart failure: Secondary | ICD-10-CM | POA: Diagnosis not present

## 2022-06-04 MED ORDER — VENLAFAXINE HCL ER 37.5 MG PO CP24
37.5000 mg | ORAL_CAPSULE | Freq: Every day | ORAL | 2 refills | Status: DC
Start: 2022-06-04 — End: 2022-06-26

## 2022-06-04 NOTE — Patient Instructions (Signed)
Send me a message in 6 weeks to let me know how the medication is working for you

## 2022-06-04 NOTE — Progress Notes (Unsigned)
Established Patient Office Visit  Subjective   [name], male    DOB: 06-30-1965  Age: 57 y.o. MRN: [id number]  Chief Complaint  Patient presents with   Anxiety    Pt is here for follow up   Anxiety-- pt states that the prozac is making his anxiety worse rather than better, states he is feeling an increase in nervous energy on the medication. States he is only taking 1/2 tablet of the clonazepam as needed, is still trying to wean himself off the medication. We discussed other medication we could try, including switching to SNRI's instead, pt is agreeable.  Chronic systolic CHF --pt reports he is feeling well on the medication, farxiga 10 mg daily and entreso, states that he has no swelling, no SOB. Is due for his annual labs today.    Current Outpatient Medications  Medication Instructions   acetaminophen (TYLENOL) 1,000 mg, Oral, 2 times daily PRN   Aspirin Low Dose 81 mg, Oral, Daily   atorvastatin (LIPITOR) 80 mg, Oral, Daily   carvedilol (COREG) 25 mg, Oral, 2 times daily with meals   clonazePAM (KLONOPIN) 0.25-0.5 mg, Oral, Daily PRN   dapagliflozin propanediol (FARXIGA) 10 mg, Oral, Daily, LAST REFILL. NEEDS FOLLOW UP APPOINTMENT.   Multiple Vitamins-Minerals (MULTIVITAMIN WITH MINERALS) tablet 1 tablet, Oral, 3 times weekly   omeprazole (PRILOSEC OTC) 20 mg, Oral, Daily   sacubitril-valsartan (ENTRESTO) 97-103 MG 1 tablet, Oral, 2 times daily   spironolactone (ALDACTONE) 25 mg, Oral, Daily   tamsulosin (FLOMAX) 0.4 mg, Oral, Daily   venlafaxine XR (EFFEXOR XR) 37.5 mg, Oral, Daily with breakfast     Patient Active Problem List   Diagnosis Date Noted   Benign prostatic hyperplasia with hesitancy 11/27/2021   ICD (implantable cardioverter-defibrillator) infection 09/19/2020   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/2020   Ischemic cardiomyopathy 10/31/2019   Non-ischemic cardiomyopathy 10/31/2019   ICD (implantable cardioverter-defibrillator) in place  10/31/2019   Chronic systolic congestive heart failure    Unstable angina    History of Guillain-Barre syndrome 12/19/2018   Acute systolic heart failure 06/11/2018   Acute respiratory failure with hypoxia 06/05/2018   Abnormal EKG 06/05/2018   Anxiety 06/05/2018   Duodenal stenosis    Gastric ulcer without hemorrhage or perforation    Iron deficiency anemia 09/25/2016   Peptic ulcer of stomach 09/25/2016   Duodenal ulcer 09/25/2016   NSAID long-term use       Review of Systems  All other systems reviewed and are negative.     Objective:     BP 100/78 (BP Location: Left Arm, Patient Position: Sitting, Cuff Size: Normal)   Pulse 99   Temp 97.6 F (36.4 C) (Oral)   Ht 6' (1.829 m)   Wt 183 lb 6.4 oz (83.2 kg)   SpO2 97%   BMI 24.87 kg/m    Physical Exam Vitals reviewed.  Constitutional:      Appearance: Normal appearance. He is well-groomed and normal weight.  Eyes:     Extraocular Movements: Extraocular movements intact.     Conjunctiva/sclera: Conjunctivae normal.  Neck:     Thyroid: No thyromegaly.  Cardiovascular:     Rate and Rhythm: Normal rate and regular rhythm.     Heart sounds: S1 normal and S2 normal. No murmur heard. Pulmonary:     Effort: Pulmonary effort is normal.     Breath sounds: Normal breath sounds and air entry. No rales.  Musculoskeletal:     Right lower leg: No edema.  Left lower leg: No edema.  Neurological:     General: No focal deficit present.     Mental Status: He is alert and oriented to person, place, and time.     Gait: Gait is intact.  Psychiatric:        Mood and Affect: Mood and affect normal.      No results found for any visits on 06/04/22.    The 10-year ASCVD risk score (Arnett DK, et al., 2019) is: 2.7%    Assessment & Plan:   Problem List Items Addressed This Visit       Unprioritized   Iron deficiency anemia    History of, will check new CBC      Relevant Orders   CBC with  Differential/Platelets (Completed)   Anxiety - Primary    Prozac is making him "antsy" maybe exacerbating his anxiety. Will switch patient to effexor 37.5 mg daily. He may continue to use the clonazepam 1/2 tablet daily as needed for additional anxiety.      Relevant Medications   venlafaxine XR (EFFEXOR XR) 37.5 MG 24 hr capsule   Chronic systolic congestive heart failure    Needs his annual labs today, orders placed. No symptoms or new issues to report today      Relevant Orders   CMP (Completed)   Lipid Panel (Completed)   TSH (Completed)    Return in about 4 months (around 10/04/2022).    Karie Georges, MD

## 2022-06-05 ENCOUNTER — Other Ambulatory Visit (INDEPENDENT_AMBULATORY_CARE_PROVIDER_SITE_OTHER): Payer: Managed Care, Other (non HMO)

## 2022-06-05 DIAGNOSIS — I5022 Chronic systolic (congestive) heart failure: Secondary | ICD-10-CM

## 2022-06-05 DIAGNOSIS — D509 Iron deficiency anemia, unspecified: Secondary | ICD-10-CM

## 2022-06-05 LAB — LIPID PANEL
Cholesterol: 144 mg/dL (ref 0–200)
HDL: 40.9 mg/dL (ref >39.00)
LDL Cholesterol: 68 mg/dL (ref 0–99)
NonHDL: 102.76
Total CHOL/HDL Ratio: 4
Triglycerides: 173 mg/dL — ABNORMAL HIGH (ref 0.0–149.0)
VLDL: 34.6 mg/dL (ref 0.0–40.0)

## 2022-06-05 LAB — TSH: TSH: 1.49 u[IU]/mL (ref 0.35–5.50)

## 2022-06-05 LAB — CBC WITH DIFFERENTIAL/PLATELET
Basophils Absolute: 0.1 10*3/uL (ref 0.0–0.1)
Basophils Relative: 0.6 % (ref 0.0–3.0)
Eosinophils Absolute: 0.1 10*3/uL (ref 0.0–0.7)
Eosinophils Relative: 1.9 % (ref 0.0–5.0)
HCT: 45.9 % (ref 39.0–52.0)
Hemoglobin: 15.7 g/dL (ref 13.0–17.0)
Lymphocytes Relative: 21.3 % (ref 12.0–46.0)
Lymphs Abs: 1.7 10*3/uL (ref 0.7–4.0)
MCHC: 34.1 g/dL (ref 30.0–36.0)
MCV: 98.1 fl (ref 78.0–100.0)
Monocytes Absolute: 0.6 10*3/uL (ref 0.1–1.0)
Monocytes Relative: 7.9 % (ref 3.0–12.0)
Neutro Abs: 5.4 10*3/uL (ref 1.4–7.7)
Neutrophils Relative %: 68.3 % (ref 43.0–77.0)
Platelets: 253 10*3/uL (ref 150.0–400.0)
RBC: 4.68 Mil/uL (ref 4.22–5.81)
RDW: 12.9 % (ref 11.5–15.5)
WBC: 7.8 10*3/uL (ref 4.0–10.5)

## 2022-06-05 LAB — COMPREHENSIVE METABOLIC PANEL
ALT: 19 U/L (ref 0–53)
AST: 18 U/L (ref 0–37)
Albumin: 4.8 g/dL (ref 3.5–5.2)
Alkaline Phosphatase: 55 U/L (ref 39–117)
BUN: 24 mg/dL — ABNORMAL HIGH (ref 6–23)
CO2: 26 mEq/L (ref 19–32)
Calcium: 9.7 mg/dL (ref 8.4–10.5)
Chloride: 103 mEq/L (ref 96–112)
Creatinine, Ser: 0.76 mg/dL (ref 0.40–1.50)
GFR: 101.6 mL/min (ref >60.00)
Glucose, Bld: 106 mg/dL — ABNORMAL HIGH (ref 70–99)
Potassium: 3.7 mEq/L (ref 3.5–5.1)
Sodium: 139 mEq/L (ref 135–145)
Total Bilirubin: 0.6 mg/dL (ref 0.2–1.2)
Total Protein: 7.9 g/dL (ref 6.0–8.3)

## 2022-06-06 NOTE — Assessment & Plan Note (Signed)
Needs his annual labs today, orders placed. No symptoms or new issues to report today

## 2022-06-06 NOTE — Assessment & Plan Note (Signed)
History of, will check new CBC

## 2022-06-06 NOTE — Assessment & Plan Note (Signed)
Prozac is making him "antsy" maybe exacerbating his anxiety. Will switch patient to effexor 37.5 mg daily. He may continue to use the clonazepam 1/2 tablet daily as needed for additional anxiety.

## 2022-06-09 ENCOUNTER — Other Ambulatory Visit (HOSPITAL_COMMUNITY): Payer: Self-pay | Admitting: Cardiology

## 2022-06-26 ENCOUNTER — Other Ambulatory Visit: Payer: Self-pay | Admitting: Family Medicine

## 2022-06-26 DIAGNOSIS — F419 Anxiety disorder, unspecified: Secondary | ICD-10-CM

## 2022-07-02 ENCOUNTER — Other Ambulatory Visit: Payer: Self-pay | Admitting: Family Medicine

## 2022-07-02 ENCOUNTER — Encounter: Payer: Self-pay | Admitting: Family Medicine

## 2022-07-02 DIAGNOSIS — F419 Anxiety disorder, unspecified: Secondary | ICD-10-CM

## 2022-07-02 MED ORDER — CLONAZEPAM 0.5 MG PO TABS
0.2500 mg | ORAL_TABLET | Freq: Every day | ORAL | 0 refills | Status: DC | PRN
Start: 2022-07-02 — End: 2022-10-04

## 2022-07-07 ENCOUNTER — Other Ambulatory Visit (HOSPITAL_COMMUNITY): Payer: Self-pay | Admitting: Cardiology

## 2022-07-09 ENCOUNTER — Other Ambulatory Visit (HOSPITAL_COMMUNITY): Payer: Self-pay

## 2022-07-09 MED ORDER — DAPAGLIFLOZIN PROPANEDIOL 10 MG PO TABS: 10.0000 mg | ORAL_TABLET | Freq: Every day | ORAL | 0 refills | Status: DC

## 2022-07-09 NOTE — Telephone Encounter (Signed)
Attempted to contact pt to schedule follow up and address refill request- [facility]  My chart message   Refill pending until follow up is scheduled

## 2022-07-30 ENCOUNTER — Encounter (HOSPITAL_COMMUNITY): Payer: Self-pay | Admitting: Cardiology

## 2022-07-30 ENCOUNTER — Other Ambulatory Visit: Payer: Self-pay | Admitting: Cardiovascular Disease

## 2022-07-30 MED ORDER — DAPAGLIFLOZIN PROPANEDIOL 10 MG PO TABS: 10.0000 mg | ORAL_TABLET | Freq: Every day | ORAL | 2 refills | Status: DC

## 2022-08-16 ENCOUNTER — Other Ambulatory Visit: Payer: Self-pay | Admitting: Family Medicine

## 2022-08-16 DIAGNOSIS — F419 Anxiety disorder, unspecified: Secondary | ICD-10-CM

## 2022-08-17 ENCOUNTER — Other Ambulatory Visit: Payer: Self-pay | Admitting: Family Medicine

## 2022-08-17 DIAGNOSIS — N401 Enlarged prostate with lower urinary tract symptoms: Secondary | ICD-10-CM

## 2022-09-11 ENCOUNTER — Encounter: Payer: Self-pay | Admitting: Family Medicine

## 2022-09-11 DIAGNOSIS — F419 Anxiety disorder, unspecified: Secondary | ICD-10-CM

## 2022-09-11 MED ORDER — VENLAFAXINE HCL ER 75 MG PO CP24
75.0000 mg | ORAL_CAPSULE | Freq: Every day | ORAL | 1 refills | Status: DC
Start: 2022-09-11 — End: 2022-11-26

## 2022-09-14 ENCOUNTER — Encounter (HOSPITAL_COMMUNITY): Payer: Self-pay | Admitting: Cardiology

## 2022-09-14 ENCOUNTER — Ambulatory Visit (HOSPITAL_COMMUNITY)
Admission: RE | Admit: 2022-09-14 | Discharge: 2022-09-14 | Disposition: A | Discharge status: Home / Self Care | Payer: Managed Care, Other (non HMO) | Source: Ambulatory Visit | Attending: Cardiology | Admitting: Cardiology

## 2022-09-14 VITALS — BP 104/70 | HR 62 | Wt 186.2 lb

## 2022-09-14 DIAGNOSIS — I5022 Chronic systolic (congestive) heart failure: Secondary | ICD-10-CM | POA: Diagnosis not present

## 2022-09-14 DIAGNOSIS — I2510 Atherosclerotic heart disease of native coronary artery without angina pectoris: Secondary | ICD-10-CM | POA: Insufficient documentation

## 2022-09-14 DIAGNOSIS — Z955 Presence of coronary angioplasty implant and graft: Secondary | ICD-10-CM | POA: Diagnosis not present

## 2022-09-14 DIAGNOSIS — Z95810 Presence of automatic (implantable) cardiac defibrillator: Secondary | ICD-10-CM | POA: Diagnosis not present

## 2022-09-14 DIAGNOSIS — Z8249 Family history of ischemic heart disease and other diseases of the circulatory system: Secondary | ICD-10-CM | POA: Diagnosis not present

## 2022-09-14 DIAGNOSIS — Z79899 Other long term (current) drug therapy: Secondary | ICD-10-CM | POA: Insufficient documentation

## 2022-09-14 DIAGNOSIS — Z7982 Long term (current) use of aspirin: Secondary | ICD-10-CM | POA: Diagnosis not present

## 2022-09-14 LAB — BASIC METABOLIC PANEL
Anion gap: 8 (ref 5–15)
BUN: 16 mg/dL (ref 6–20)
CO2: 27 mmol/L (ref 22–32)
Calcium: 9.1 mg/dL (ref 8.9–10.3)
Chloride: 105 mmol/L (ref 98–111)
Creatinine, Ser: 0.76 mg/dL (ref 0.61–1.24)
GFR, Estimated: >60 mL/min (ref >60)
Glucose, Bld: 104 mg/dL — ABNORMAL HIGH (ref 70–99)
Potassium: 3.9 mmol/L (ref 3.5–5.1)
Sodium: 140 mmol/L (ref 135–145)

## 2022-09-14 LAB — BRAIN NATRIURETIC PEPTIDE: B Natriuretic Peptide: 18.9 pg/mL (ref 0.0–100.0)

## 2022-09-14 NOTE — Patient Instructions (Signed)
Good to see you today!  Your physician has requested that you have an echocardiogram. Echocardiography is a painless test that uses sound waves to create images of your heart. It provides your doctor with information about the size and shape of your heart and how well your heart's chambers and valves are working. This procedure takes approximately one hour. There are no restrictions for this procedure. Please do NOT wear cologne, perfume, aftershave, or lotions (deodorant is allowed). Please arrive 15 minutes prior to your appointment time.  Lab work done today we will call you with any abnormal results  Your physician recommends that you schedule a follow-up appointment in: 6 months with app clinic(January 2025) Call office in November to schedule an appointment  If you have any questions or concerns before your next appointment please send [place] a message through [place] or call our office at [phone].    TO LEAVE A MESSAGE FOR THE NURSE SELECT OPTION 2, PLEASE LEAVE A MESSAGE INCLUDING: YOUR NAME DATE OF BIRTH CALL BACK NUMBER REASON FOR CALL**this is important as we prioritize the call backs  YOU WILL RECEIVE A CALL BACK THE SAME DAY AS LONG AS YOU CALL BEFORE 4:00 PM At the [facility], you and your health needs are our priority. As part of our continuing mission to provide you with exceptional heart care, we have created designated Provider Care Teams. These Care Teams include your primary Cardiologist (physician) and Advanced Practice Providers (APPs- Physician Assistants and Nurse Practitioners) who all work together to provide you with the care you need, when you need it.   You may see any of the following providers on your designated Care Team at your next follow up: Dr Arvilla Meres Dr Marca Ancona Dr. Marcos Eke, NP Robbie Lis, [place] [facility] [place], [place] Brynda Peon, NP Karle Plumber, PharmD   Please be sure to bring  in all your medications bottles to every appointment.    Thank you for choosing [facility]

## 2022-09-16 NOTE — Progress Notes (Signed)
PCP: Karie Georges, MD Cardiology: Dr. Eden Emms HF Cardiology: Dr. Shirlee Latch  57 y.o. with history of chronic systolic CHF and CAD returns for followup of CHF.  Patient was in his usual state of health until around 4/20.  He was admitted at that time with acute CHF, had been short of breath and orthopneic at home.  No chest pain.  At admission, he was found to be volume overloaded with echo showing EF 20%. He was started on cardiac meds and discharged.  Cardiac MRI in 10/20 showed EF 24%, moderately decreased RV systolic function, and full thickness LGE in the basal-mid inferior wall concerning for prior MI. He then had right and left heart cath in 12/20. This showed normal filling pressures and cardiac output.  He had DES to mid LCx/OM2 and distal RCA.    Echo in 3/21 showed EF 20-25% with diffuse hypokinesis.  Mildly decreased RV systolic function. He had [employer] ICD placed in 5/21.  Patient developed an ICD pocket infection, and device was removed in 8/22.  Echo in 9/22 showed EF 40%, global hypokinesis, RV normal.  Echo in 10/23 showed EF 35-40%, normal RV.   He has been doing well symptomatically.  Works full time in Airline pilot, does a lot of traveling.  Walks for exercise, no exertional dyspnea or chest pain.  Does yardwork.  No orthopnea/PND.  No lightheadedness.  Generally feels good.   Labs (11/20): K 4.4, creatinine 0.68, hgb 13.5 Labs (1/21): K 3.9, creatinine 0.77 => 0.61 Labs (3/21): LDL 52 Labs (5/21): K 4, creatinine 0.79 Labs (7/22): K 4.2, creatinine 0.78 Labs (8/22): LDL 69, TGs 170, K 3.8, creatinine 0.62 Labs (4/24): LDL 68, K 3.7, creatinine 1.61  PMH: 1. H/o Guillain Barre syndrome with recovery in 1984.  2. GERD 3. Nephrolithiasis 4. PUD 5. 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  Echo (4/20) with EF 20%, moderate to severe LV dilation, moderately decreased RV systolic function, mild-moderate MR.  - Cardiac MRI (10/20): EF 24%, severe LV  dilation, diffuse hypokinesis, full thickness scar basal-mid inferolateral wall suggestive of prior MI.  - LHC/RHC (12/20): 90% mLCx/OM2 stenosis treated with DES, 80% distal RCA stenosis treated with DES.  Mean RA 1, mean PA 14, mean PCWP 9, CI 2.63.  - Echo (3/21): EF 20-25% - [employer] ICD placed but developed pocket infection and device was removed in 8/22.  - Echo (10/23): EF 35-40%, normal RV. 6. Prior smoker.  7. CAD: LHC (12/20) with 90% mLCx/OM2 stenosis treated with DES, 80% distal RCA stenosis treated with DES.  - Dyspnea with ticagrelor.   FH: Mother with CHF developed in her 49s, father with PCI, brother with PCI.   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  No ETOH. Married, lives in [place].  Plays in a classic rock band.   ROS: All systems reviewed and negative except as per HPI.   Current Outpatient Medications  Medication Sig Dispense Refill   acetaminophen (TYLENOL) 500 MG tablet Take 1,000 mg 2 (two) times daily as needed by mouth for moderate pain.     ASPIRIN LOW DOSE 81 MG tablet TAKE 1 TABLET BY MOUTH EVERY DAY 90 tablet 0   atorvastatin (LIPITOR) 80 MG tablet TAKE 1 TABLET BY MOUTH EVERY DAY 90 tablet 1   carvedilol (COREG) 25 MG tablet TAKE 1 TABLET (25 MG TOTAL) BY MOUTH TWICE A DAY WITH MEALS 180 tablet 0   clonazePAM (KLONOPIN) 0.5 MG tablet Take 0.5-1  tablets (0.25-0.5 mg total) by mouth daily as needed for anxiety. 90 tablet 0   dapagliflozin propanediol (FARXIGA) 10 MG TABS tablet Take 1 tablet (10 mg total) by mouth daily. 30 tablet 2   Multiple Vitamins-Minerals (MULTIVITAMIN WITH MINERALS) tablet Take 1 tablet by mouth 3 (three) times a week.     omeprazole (PRILOSEC OTC) 20 MG tablet Take 20 mg by mouth daily.     sacubitril-valsartan (ENTRESTO) 97-103 MG Take 1 tablet by mouth 2 (two) times daily. 180 tablet 2   spironolactone (ALDACTONE) 25 MG tablet Take 1 tablet (25 mg total) by mouth daily. 90 tablet 3    tamsulosin (FLOMAX) 0.4 MG CAPS capsule TAKE 1 CAPSULE BY MOUTH EVERY DAY 90 capsule 0   venlafaxine XR (EFFEXOR XR) 75 MG 24 hr capsule Take 1 capsule (75 mg total) by mouth daily with breakfast. 90 capsule 1   No current facility-administered medications for this encounter.   BP 104/70   Pulse 62   Wt 84.5 kg (186 lb 3.2 oz)   SpO2 96%   BMI 25.25 kg/m  General: NAD Neck: No JVD, no thyromegaly or thyroid nodule.  Lungs: Clear to auscultation bilaterally with normal respiratory effort. CV: Nondisplaced PMI.  Heart regular S1/S2, no S3/S4, no murmur.  No peripheral edema.  No carotid bruit.  Normal pedal pulses.  Abdomen: Soft, nontender, no hepatosplenomegaly, no distention.  Skin: Intact without lesions or rashes.  Neurologic: Alert and oriented x 3.  Psych: Normal affect. Extremities: No clubbing or cyanosis.  HEENT: Normal.   Assessment/Plan: 1. Chronic systolic CHF: Suspect mixed ischemic/nonischemic cardiomyopathy.  I do not think that his CAD can explain the extent of the cardiomyopathy. Possible co-existing viral myocarditis though signs of this were not seen on MRI (cannot rule out).  No heavy ETOH or drug use.  Mother with CHF at age 27, uncertain of diagnosis => familial cardiomyopathy is a consideration.  Echo in 3/21 showed that EF remained 20-25% so [employer] ICD was placed, this was explanted in 8/22 due to pocket site infection.  Echo in 9/22 showed improved EF, 40%.  Echo in 10/23 stable with EF 35-40%.  NYHA class I symptoms.  Not volume overloaded. On excellent medical regimen.  - Continue Coreg 25 mg bid.  - Continue Entresto 97/103 bid.  - Continue spironolactone 25 mg daily.  BMET/BNP today.  - Continue dapagliflozin.  - He is out of range at this point for ICD with EF > 35% on last echo, will not replace.  - I will arrange for repeat echo at 1 year in 10/24.  2. CAD: 12/20 PCI to mid LCx/OM2 and distal RCA.  As above, I do not think CAD alone can account  for his cardiomyopathy (though suspect it plays a role).  No chest pain.  - Continue ASA 81 daily.  - Continue statin, lipids ok in 4/24.     BMET 3 months, followup 6 months.   Marca Ancona 09/16/2022

## 2022-09-22 ENCOUNTER — Other Ambulatory Visit: Payer: Self-pay | Admitting: Family Medicine

## 2022-09-22 DIAGNOSIS — F419 Anxiety disorder, unspecified: Secondary | ICD-10-CM

## 2022-10-04 ENCOUNTER — Other Ambulatory Visit: Payer: Self-pay | Admitting: Family Medicine

## 2022-10-04 DIAGNOSIS — F419 Anxiety disorder, unspecified: Secondary | ICD-10-CM

## 2022-10-04 MED ORDER — CLONAZEPAM 0.5 MG PO TABS
0.2500 mg | ORAL_TABLET | Freq: Every day | ORAL | 0 refills | Status: DC | PRN
Start: 2022-10-04 — End: 2023-01-03

## 2022-10-08 ENCOUNTER — Ambulatory Visit (HOSPITAL_COMMUNITY)
Admission: RE | Admit: 2022-10-08 | Discharge: 2022-10-08 | Disposition: A | Discharge status: Home / Self Care | Payer: Managed Care, Other (non HMO) | Source: Ambulatory Visit | Attending: Cardiology | Admitting: Cardiology

## 2022-10-08 DIAGNOSIS — I5030 Unspecified diastolic (congestive) heart failure: Secondary | ICD-10-CM | POA: Diagnosis not present

## 2022-10-08 DIAGNOSIS — I5022 Chronic systolic (congestive) heart failure: Secondary | ICD-10-CM | POA: Insufficient documentation

## 2022-10-08 DIAGNOSIS — I340 Nonrheumatic mitral (valve) insufficiency: Secondary | ICD-10-CM

## 2022-10-08 DIAGNOSIS — I77810 Thoracic aortic ectasia: Secondary | ICD-10-CM

## 2022-10-08 LAB — ECHOCARDIOGRAM COMPLETE
Area-P 1/2: 2.87 cm2
Calc EF: 37.5 %
Est EF: 35
MV M vel: 4.52 m/s
MV Peak grad: 81.7 mmHg
Radius: 0.2 cm
S' Lateral: 4.1 cm
Single Plane A2C EF: 37.4 %
Single Plane A4C EF: 37.5 %

## 2022-10-08 NOTE — Progress Notes (Signed)
Echocardiogram 2D Echocardiogram has been performed.  Augustine Radar 10/08/2022, 12:12 PM

## 2022-10-26 ENCOUNTER — Other Ambulatory Visit (HOSPITAL_COMMUNITY): Payer: Self-pay | Admitting: Cardiology

## 2022-11-22 ENCOUNTER — Encounter: Payer: Self-pay | Admitting: Family Medicine

## 2022-11-23 ENCOUNTER — Other Ambulatory Visit: Payer: Self-pay | Admitting: Family Medicine

## 2022-11-23 DIAGNOSIS — N401 Enlarged prostate with lower urinary tract symptoms: Secondary | ICD-10-CM

## 2022-11-24 ENCOUNTER — Other Ambulatory Visit (HOSPITAL_COMMUNITY): Payer: Self-pay | Admitting: Cardiology

## 2022-11-26 ENCOUNTER — Encounter: Payer: Self-pay | Admitting: Family Medicine

## 2022-11-26 ENCOUNTER — Telehealth: Payer: Managed Care, Other (non HMO) | Admitting: Family Medicine

## 2022-11-26 VITALS — Wt 178.2 lb

## 2022-11-26 DIAGNOSIS — N401 Enlarged prostate with lower urinary tract symptoms: Secondary | ICD-10-CM | POA: Diagnosis not present

## 2022-11-26 DIAGNOSIS — Z1211 Encounter for screening for malignant neoplasm of colon: Secondary | ICD-10-CM

## 2022-11-26 DIAGNOSIS — F419 Anxiety disorder, unspecified: Secondary | ICD-10-CM | POA: Diagnosis not present

## 2022-11-26 DIAGNOSIS — R3911 Hesitancy of micturition: Secondary | ICD-10-CM | POA: Diagnosis not present

## 2022-11-26 MED ORDER — VENLAFAXINE HCL ER 37.5 MG PO CP24: 37.5000 mg | ORAL_CAPSULE | Freq: Every day | ORAL | Status: DC

## 2022-11-26 MED ORDER — TAMSULOSIN HCL 0.4 MG PO CAPS
0.4000 mg | ORAL_CAPSULE | Freq: Every day | ORAL | 1 refills | Status: DC
Start: 2022-11-26 — End: 2023-05-27

## 2022-11-26 MED ORDER — MIRTAZAPINE 15 MG PO TBDP: ORAL_TABLET | ORAL | 1 refills | Status: DC

## 2022-11-26 NOTE — Patient Instructions (Signed)
Decrease effexor to 37.5 mg daily  Start mirtazapine 1/2 tablet daily at bedtime, may increase to 1 tablet daily at bedtime.

## 2022-11-26 NOTE — Assessment & Plan Note (Signed)
We discussed other medications that could work for him. I reviewed the side effects of mirtazapine with the patient. Since he is having more trouble falling asleep at night this would be a good choice to try next as it has very little GI side effects to it. Will start with 1/2 tablet at bedtime and decrease the effexor to 37.5 mg daily. Pt will let me know in a month or so how he is doing with the changes.

## 2022-11-26 NOTE — Progress Notes (Signed)
Virtual Medical Office Visit  Patient:  Austin [name]      Age: 57 y.o.       Sex:  male  Date:   11/26/2022  PCP:    Karie Georges, MD   Today's Healthcare Provider: Karie Georges, MD    Assessment/Plan:   Summary assessment:  [name] was seen today for medical management of chronic issues.  Anxiety Assessment & Plan: We discussed other medications that could work for him. I reviewed the side effects of mirtazapine with the patient. Since he is having more trouble falling asleep at night this would be a good choice to try next as it has very little GI side effects to it. Will start with 1/2 tablet at bedtime and decrease the effexor to 37.5 mg daily. Pt will let me know in a month or so how he is doing with the changes.  Orders: -     Mirtazapine; Start with 1/2 tablet daily for 7 days, may increase to 1 tablet daily at bedtime if needed  Dispense: 30 tablet; Refill: 1 -     Venlafaxine HCl ER; Take 1 capsule (37.5 mg total) by mouth daily with breakfast.  Benign prostatic hyperplasia with hesitancy -     Tamsulosin HCl; Take 1 capsule (0.4 mg total) by mouth daily.  Dispense: 90 capsule; Refill: 1  Colon cancer screening -     Cologuard     Return in about 3 months (around 02/26/2023) for video visit for follow up on anxiety.   He was advised to call the office or go to ER if his condition worsens    Subjective:   Austin [name] is a 57 y.o. male with PMH significant for: Past Medical History:  Diagnosis Date   AICD (automatic cardioverter/defibrillator) present    Anxiety    CHF (congestive heart failure) (HCC)    Gastric ulcer    GERD (gastroesophageal reflux disease)    Guillain Barr syndrome (HCC) 1984   COMPLETE RECOVERY; triggered from flu illness   Headache(784.0)    hx of tension-none recent   Hematemesis 09/07/2016   History of kidney stones 09/19/2012   left ureteral stone-SURGERY 8/6 URETEROSCOPY / STENT PLACEMENT    Partial  gastric outlet obstruction    UGIB (upper gastrointestinal bleed) 09/07/2016     Presenting today with: Chief Complaint  Patient presents with   Medical Management of Chronic Issues     He clarifies and reports that his condition: Pt is here to discuss his anxiety medications. He has been taking effexor 75 mg daily. States that he thinks his anxiety is getting worse. He reports that his ECHO showed a decreased in his EF and he is going for more testing -- an MRI of the heart to see if he needed a new defibrillator.  He reports he has been having constipation with the effexor, also decreased appetite, has lost about 8 pounds since increasing the dose to 75 mg. He reports that it seemed to be working for his anxiety up until he found out about his heart function. He is taking 1 tablet of the clonazepam at nighttime, tried to only take 1/2 tablet but it wasn't helping with his anxiety due to the added stress over his heart condition. Pt has tried multiple other SSRI's in the past including prozac, sertraline, lexapro, etc.   He denies having any: Depression sx, mood swings          Objective/Observations  Physical Exam:  Polite and friendly Gen: NAD, resting comfortably Pulm: Normal work of breathing Neuro: Grossly normal, moves all extremities Psych: Normal affect and thought content Problem specific physical exam findings: N/A   No images are attached to the encounter or orders placed in the encounter.    Results: No results found for any visits on 11/26/22.            Virtual Visit via Video   I connected with Austin [name] on 11/26/22 at  8:00 AM EDT by a video enabled telemedicine application and verified that I am speaking with the correct person using two identifiers. The limitations of evaluation and management by telemedicine and the availability of in person appointments were discussed. The patient expressed understanding and agreed to proceed.    Percentage of appointment time on video:  100% Patient location: Home Provider location: [facility] Persons participating in the virtual visit: Myself and Patient

## 2022-12-03 ENCOUNTER — Encounter: Payer: Self-pay | Admitting: Family Medicine

## 2022-12-21 ENCOUNTER — Other Ambulatory Visit: Payer: Self-pay | Admitting: Family Medicine

## 2022-12-21 DIAGNOSIS — F419 Anxiety disorder, unspecified: Secondary | ICD-10-CM

## 2022-12-25 LAB — COLOGUARD: COLOGUARD: NEGATIVE

## 2023-01-03 ENCOUNTER — Other Ambulatory Visit: Payer: Self-pay | Admitting: Family Medicine

## 2023-01-03 DIAGNOSIS — F419 Anxiety disorder, unspecified: Secondary | ICD-10-CM

## 2023-01-03 MED ORDER — CLONAZEPAM 0.5 MG PO TABS
0.2500 mg | ORAL_TABLET | Freq: Every day | ORAL | 0 refills | Status: DC | PRN
Start: 2023-01-03 — End: 2023-04-02

## 2023-01-22 ENCOUNTER — Other Ambulatory Visit: Payer: Self-pay | Admitting: Cardiovascular Disease

## 2023-01-28 ENCOUNTER — Telehealth (HOSPITAL_COMMUNITY): Payer: Self-pay

## 2023-01-28 NOTE — Telephone Encounter (Signed)
[employer], [place], [place]  You8 minutes ago (3:42 PM)    OK from CV standpoint for root canal.    Copy of clearance faxed to requesting office via Epic Fax Function.

## 2023-01-28 NOTE — Telephone Encounter (Signed)
  [facility]   Pre-operative Risk Assessment    Request for Surgical Clearance{  Procedure:   ROOT CANAL { Date of Surgery:  Clearance 01/31/23                               Surgeon:  Jolene Provost DDS, PA Surgeon's Group or Practice Name:  [facility] Provost, DDS, [facility] Phone number:  [phone] Fax number:  [phone]   Type of Clearance Requested:   - Medical    Type of Anesthesia:  Local  { Additional requests/questions:  Please advise surgeon/provider what medications should be held. Please fax a copy of MEDICAL CLEARANCE to the surgeon's office.  Signed, Austin [name]   01/28/2023, 3:17 PM

## 2023-01-28 NOTE — Telephone Encounter (Signed)
   Pre-operative Risk Assessment      Patient Name: Austin [name]  DOB: June 03, 1965 MRN: [id number]{   LAST OVEden Emms  04-2022 NEXT OV: NONE    Request for Surgical Clearance     Procedure:   ROOT CANAL TREATMENT    Date of Surgery:  Clearance 01/31/23                                 Surgeon:  Dr. Oren Binet Surgeon's Group or Practice Name:  Dr. Oren Binet DDS  Phone number:  [phone] Fax number:  [phone]    Type of Clearance Requested:   - Medical     Type of Anesthesia:  Local    Additional requests/questions:   N/A   SignedOleta Mouse   01/28/2023, 2:30 PM

## 2023-03-17 ENCOUNTER — Other Ambulatory Visit: Payer: Self-pay | Admitting: Family Medicine

## 2023-03-17 DIAGNOSIS — F419 Anxiety disorder, unspecified: Secondary | ICD-10-CM

## 2023-03-28 ENCOUNTER — Telehealth (HOSPITAL_COMMUNITY): Payer: Self-pay | Admitting: Family Medicine

## 2023-04-02 ENCOUNTER — Other Ambulatory Visit: Payer: Self-pay | Admitting: Family Medicine

## 2023-04-02 DIAGNOSIS — F419 Anxiety disorder, unspecified: Secondary | ICD-10-CM

## 2023-04-05 ENCOUNTER — Encounter: Payer: Self-pay | Admitting: Family Medicine

## 2023-04-05 MED ORDER — CLONAZEPAM 0.5 MG PO TABS
0.2500 mg | ORAL_TABLET | Freq: Every day | ORAL | 0 refills | Status: DC | PRN
Start: 2023-04-05 — End: 2023-06-25

## 2023-04-19 ENCOUNTER — Encounter (HOSPITAL_COMMUNITY): Payer: Managed Care, Other (non HMO)

## 2023-04-19 ENCOUNTER — Telehealth (HOSPITAL_COMMUNITY): Payer: Self-pay

## 2023-04-19 NOTE — Telephone Encounter (Signed)
 Called to confirm/remind patient of their appointment at the [facility] on 04/22/23.   Patient reminded to bring all medications and/or complete list.  Confirmed patient has transportation. Gave directions, instructed to utilize valet parking.  Confirmed appointment prior to ending call.

## 2023-04-21 ENCOUNTER — Other Ambulatory Visit: Payer: Self-pay | Admitting: Cardiovascular Disease

## 2023-04-22 ENCOUNTER — Ambulatory Visit (HOSPITAL_COMMUNITY)
Admission: RE | Admit: 2023-04-22 | Discharge: 2023-04-22 | Disposition: A | Discharge status: Home / Self Care | Payer: Managed Care, Other (non HMO) | Source: Ambulatory Visit | Attending: Cardiology | Admitting: Cardiology

## 2023-04-22 ENCOUNTER — Encounter (HOSPITAL_COMMUNITY): Payer: Self-pay

## 2023-04-22 VITALS — BP 122/86 | HR 80 | Ht 72.0 in | Wt 189.6 lb

## 2023-04-22 DIAGNOSIS — Z87891 Personal history of nicotine dependence: Secondary | ICD-10-CM | POA: Insufficient documentation

## 2023-04-22 DIAGNOSIS — Z7982 Long term (current) use of aspirin: Secondary | ICD-10-CM | POA: Diagnosis not present

## 2023-04-22 DIAGNOSIS — Z79899 Other long term (current) drug therapy: Secondary | ICD-10-CM | POA: Insufficient documentation

## 2023-04-22 DIAGNOSIS — I2510 Atherosclerotic heart disease of native coronary artery without angina pectoris: Secondary | ICD-10-CM | POA: Diagnosis present

## 2023-04-22 DIAGNOSIS — Z95810 Presence of automatic (implantable) cardiac defibrillator: Secondary | ICD-10-CM | POA: Insufficient documentation

## 2023-04-22 DIAGNOSIS — Z955 Presence of coronary angioplasty implant and graft: Secondary | ICD-10-CM | POA: Insufficient documentation

## 2023-04-22 DIAGNOSIS — Z8249 Family history of ischemic heart disease and other diseases of the circulatory system: Secondary | ICD-10-CM | POA: Insufficient documentation

## 2023-04-22 DIAGNOSIS — Z7984 Long term (current) use of oral hypoglycemic drugs: Secondary | ICD-10-CM | POA: Insufficient documentation

## 2023-04-22 DIAGNOSIS — I5022 Chronic systolic (congestive) heart failure: Secondary | ICD-10-CM | POA: Diagnosis not present

## 2023-04-22 DIAGNOSIS — I252 Old myocardial infarction: Secondary | ICD-10-CM | POA: Diagnosis not present

## 2023-04-22 LAB — BASIC METABOLIC PANEL
Anion gap: 14 (ref 5–15)
BUN: 17 mg/dL (ref 6–20)
CO2: 24 mmol/L (ref 22–32)
Calcium: 9.6 mg/dL (ref 8.9–10.3)
Chloride: 105 mmol/L (ref 98–111)
Creatinine, Ser: 0.68 mg/dL (ref 0.61–1.24)
GFR, Estimated: >60 mL/min (ref >60)
Glucose, Bld: 97 mg/dL (ref 70–99)
Potassium: 3.9 mmol/L (ref 3.5–5.1)
Sodium: 143 mmol/L (ref 135–145)

## 2023-04-22 LAB — LIPID PANEL
Cholesterol: 148 mg/dL (ref 0–200)
HDL: 40 mg/dL — ABNORMAL LOW (ref >40)
LDL Cholesterol: 83 mg/dL (ref 0–99)
Total CHOL/HDL Ratio: 3.7 ratio
Triglycerides: 127 mg/dL (ref <150)
VLDL: 25 mg/dL (ref 0–40)

## 2023-04-22 LAB — BRAIN NATRIURETIC PEPTIDE: B Natriuretic Peptide: 14.2 pg/mL (ref 0.0–100.0)

## 2023-04-22 NOTE — Patient Instructions (Signed)
 No change in medications today. Labs today - will call you if abnormal. Return to see Dr. Shirlee Latch in 6 months. Please call us at [phone] in August to schedule this appointment. Please call us at [phone] if any questions or concerns prior to your next visit.

## 2023-04-22 NOTE — Progress Notes (Signed)
 [facility] NOTE  PCP: Karie Georges, MD Primary Cardiology: Dr. Eden Emms HF Cardiology: Dr. Shirlee Latch  Chief Complaint: Heart Failure Follow-up  HPI: 58 y.o. with pmh of chronic systolic CHF and CAD.   Cardiac history noted in 4/20. cMRI in 10/20 showed EF 24%, moderately decreased RV systolic function, and full thickness LGE in the basal-mid inferior wall concerning for prior MI. R/LHC in 12/20 showed normal filling pressures and cardiac output.  He had DES to mid LCx/OM2 and distal RCA.    Echo 3/21 showed EF 20-25% with diffuse hypokinesis and mildly decreased RV systolic function.   He had [employer] ICD placed in 5/21, but developed an ICD pocket infection, and device was removed in 8/22.  Echo in 9/22 showed EF 40%, gHK, RV normal. Echo in 10/23 showed EF 35-40%, normal RV.   Echo 8/24: EF 35%, gHK, G1DD, mildly reduced RV, mild MR  Today he returns for HF follow up. Overall feeling good. 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 Denies increasing SOB, palpitations, abnormal bleeding, CP, dizziness, edema, or PND/Orthopnea. Appetite ok. No fever or chills. Enjoys working in the yard, one episode of dizziness with this that resolved with hydration. Taking all medications.   EKG (personally reviewed): NSR 77 bpm  PMH: 1. H/o Guillain Barre syndrome with recovery in 1984.  2. GERD 3. Nephrolithiasis 4. PUD 5. 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  Echo (4/20) with EF 20%, moderate to severe LV dilation, moderately decreased RV systolic function, mild-moderate MR.  - cMRI (10/20): EF 24%, severe LV dilation, diffuse hypokinesis, full thickness scar basal-mid inferolateral wall suggestive of prior MI.  - L/RHC (12/20): 90% mLCx/OM2 stenosis treated with DES, 80% distal RCA stenosis treated with DES.  Mean RA 1, mean PA 14, mean PCWP 9, CI 2.63.  - Echo (3/21): EF 20-25% - [employer] ICD placed but developed pocket  infection and device was removed in 8/22.  - Echo (10/23): EF 35-40%, normal RV. 6. Prior smoker.  7. CAD: LHC (12/20) with 90% mLCx/OM2 stenosis treated with DES, 80% distal RCA stenosis treated with DES.  - Dyspnea with ticagrelor.   FH: Mother with CHF developed in her 80s, father with PCI, brother with PCI.   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  No ETOH. Married, lives in [place].  Plays in a classic rock band.   ROS: All systems reviewed and negative except as per HPI.   Current Outpatient Medications  Medication Sig Dispense Refill   acetaminophen (TYLENOL) 500 MG tablet Take 1,000 mg 2 (two) times daily as needed by mouth for moderate pain.     ASPIRIN LOW DOSE 81 MG tablet TAKE 1 TABLET BY MOUTH EVERY DAY 90 tablet 0   atorvastatin (LIPITOR) 80 MG tablet TAKE 1 TABLET BY MOUTH EVERY DAY 90 tablet 0   carvedilol (COREG) 25 MG tablet TAKE 1 TABLET (25 MG TOTAL) BY MOUTH TWICE A DAY WITH MEALS 180 tablet 3   clonazePAM (KLONOPIN) 0.5 MG tablet Take 0.5-1 tablets (0.25-0.5 mg total) by mouth daily as needed for anxiety. 90 tablet 0   dapagliflozin propanediol (FARXIGA) 10 MG TABS tablet TAKE 1 TABLET BY MOUTH EVERY DAY 30 tablet 6   mirtazapine (REMERON SOL-TAB) 15 MG disintegrating tablet TAKE 1/2 TABLET DAILY FOR 7 DAYS MAY INCREASE TO 1 TABLET AT BEDTIME IF NEEDED (Patient taking differently: 15 mg. TAKE  1 TABLET AT BEDTIME IF NEEDED) 90 tablet  0   Multiple Vitamins-Minerals (MULTIVITAMIN WITH MINERALS) tablet Take 1 tablet by mouth 3 (three) times a week.     omeprazole (PRILOSEC OTC) 20 MG tablet Take 20 mg by mouth daily.     sacubitril-valsartan (ENTRESTO) 97-103 MG Take 1 tablet by mouth 2 (two) times daily. 180 tablet 2   spironolactone (ALDACTONE) 25 MG tablet Take 1 tablet (25 mg total) by mouth daily. 90 tablet 3   tamsulosin (FLOMAX) 0.4 MG CAPS capsule Take 1 capsule (0.4 mg total) by mouth daily. 90 capsule 1   No current  facility-administered medications for this encounter.   BP 122/86   Pulse 80   Ht 6' (1.829 m)   Wt 86 kg (189 lb 9.6 oz)   SpO2 95%   BMI 25.71 kg/m   Filed Weights   04/22/23 1340  Weight: 86 kg (189 lb 9.6 oz)   Physical Exam: General: Well appearing. No distress on RA Cardiac: JVP not elevated. S1 and S2 present. No murmurs or rub. Resp: Lung sounds clear and equal B/L Abdomen: Soft, non-tender, non-distended. + BS. Extremities: Warm and dry. No rash, cyanosis.  No edema.  Neuro: Alert and oriented x3. Affect pleasant. Moves all extremities without difficulty.  Assessment/Plan: 1. Chronic systolic CHF: Suspect mixed ischemic/nonischemic cardiomyopathy. Possible co-existing viral myocarditis though signs of this were not seen on MRI (cannot rule out).  No heavy ETOH or drug use.  Mother with CHF at age 39, uncertain of diagnosis => familial cardiomyopathy is a consideration.  - [employer] ICD was placed 5/21. Explanted in 8/22 due to pocket site infection.   - Echo  9/22 showed improved EF 40%. Echo 10/23 and 8/24 with stable EF 35-40%. - NYHA class I symptoms. Euvolemic on exam. On excellent medical regimen. - Continue Coreg 25 mg bid.  - Continue Entresto 97/103 bid.  - Continue spironolactone 25 mg daily.  BMET/BNP today.  - Continue dapagliflozin 10 mg daily - EF 35% (out of range at this point for ICD) - Concerned about need for ICD with EF reduction from 40 to 45%, will repeat echo in 2 month.  2. CAD: 12/20 PCI to mid LCx/OM2 and distal RCA.   - No chest pain.  - Continue ASA 81 daily.  - Continue atorva 80 mg daily. Check Lipids panel today  Follow up in 6 months with Dr. McLean  Austin [name] 04/22/2023

## 2023-05-04 ENCOUNTER — Other Ambulatory Visit: Payer: Self-pay | Admitting: Cardiovascular Disease

## 2023-05-25 ENCOUNTER — Other Ambulatory Visit: Payer: Self-pay | Admitting: Family Medicine

## 2023-05-25 DIAGNOSIS — N401 Enlarged prostate with lower urinary tract symptoms: Secondary | ICD-10-CM

## 2023-05-26 NOTE — Telephone Encounter (Signed)
 Pt needs appt with me, please contact patient to schedule appt, then ok to refill this rx until his appt date

## 2023-05-27 ENCOUNTER — Other Ambulatory Visit (HOSPITAL_COMMUNITY): Payer: Self-pay | Admitting: Cardiology

## 2023-05-27 MED ORDER — TAMSULOSIN HCL 0.4 MG PO CAPS
0.4000 mg | ORAL_CAPSULE | Freq: Every day | ORAL | 1 refills | Status: DC
Start: 2023-05-27 — End: 2023-06-25

## 2023-05-27 NOTE — Addendum Note (Signed)
 Addended by: Karie Georges on: 05/27/2023 09:38 AM   Modules accepted: Orders

## 2023-05-31 ENCOUNTER — Encounter: Payer: Self-pay | Admitting: Family Medicine

## 2023-06-13 ENCOUNTER — Other Ambulatory Visit: Payer: Self-pay | Admitting: Family Medicine

## 2023-06-13 DIAGNOSIS — F419 Anxiety disorder, unspecified: Secondary | ICD-10-CM

## 2023-06-25 ENCOUNTER — Ambulatory Visit: Admitting: Family Medicine

## 2023-06-25 ENCOUNTER — Encounter: Payer: Self-pay | Admitting: Family Medicine

## 2023-06-25 DIAGNOSIS — N401 Enlarged prostate with lower urinary tract symptoms: Secondary | ICD-10-CM | POA: Diagnosis not present

## 2023-06-25 DIAGNOSIS — F419 Anxiety disorder, unspecified: Secondary | ICD-10-CM

## 2023-06-25 DIAGNOSIS — R3911 Hesitancy of micturition: Secondary | ICD-10-CM | POA: Diagnosis not present

## 2023-06-25 MED ORDER — TAMSULOSIN HCL 0.4 MG PO CAPS
0.4000 mg | ORAL_CAPSULE | Freq: Every day | ORAL | 1 refills | Status: DC
Start: 2023-06-25 — End: 2023-08-08

## 2023-06-25 MED ORDER — CLONAZEPAM 0.5 MG PO TABS
0.2500 mg | ORAL_TABLET | Freq: Every day | ORAL | 1 refills | Status: DC | PRN
Start: 2023-06-25 — End: 2023-12-25

## 2023-06-25 NOTE — Progress Notes (Signed)
 Established Patient Office Visit  Subjective   [name], male    DOB: 27-May-1965  Age: 58 y.o. MRN: [id number]  Chief Complaint  Patient presents with   Medical Management of Chronic Issues    Pt is here for follow up on his GAD, which is largely due to his chronic systolic CHF. He reports he had a ECHO over the summer last year, has one scheduled this summer as well. States his EF went down a little to 35%, states that they were holding off on doing additional scans due to him being "on the threshold" with his EF.   Anxiety-- pt states the anxiety has been stable, states that he is sleeping better at night with the mirtazapine . States he sometimes feels a little morning grogginess but otherwise he is enjoying your free time. Other than some morning grogginess there are no other side effects reported. States he is sleeping through the night with the flomax  as well which is helping with his energy levels during the day.   BPH- flomax  is working well per his report. No side effects (dizziness or low BP) has been experienced or reported today. States he is able to sleep through the night without waking up.     Current Outpatient Medications  Medication Instructions   acetaminophen  (TYLENOL ) 1,000 mg, 2 times daily PRN   Aspirin  Low Dose 81 mg, Oral, Daily   atorvastatin  (LIPITOR ) 80 mg, Oral, Daily   carvedilol  (COREG ) 25 MG tablet TAKE 1 TABLET (25 MG TOTAL) BY MOUTH TWICE A DAY WITH MEALS   clonazePAM  (KLONOPIN ) 0.25-0.5 mg, Oral, Daily PRN   ENTRESTO  97-103 MG 1 tablet, Oral, 2 times daily   Farxiga  10 mg, Oral, Daily   mirtazapine  (REMERON  SOL-TAB) 15 mg, Oral, Daily at bedtime, TAKE  1 TABLET AT BEDTIME IF NEEDED   Multiple Vitamins-Minerals (MULTIVITAMIN WITH MINERALS) tablet 1 tablet, 3 times weekly   omeprazole  (PRILOSEC  OTC) 20 mg, Daily   spironolactone  (ALDACTONE ) 25 mg, Oral, Daily   tamsulosin  (FLOMAX ) 0.4 mg, Oral, Daily    Patient Active  Problem List   Diagnosis Date Noted   Benign prostatic hyperplasia with hesitancy 11/27/2021   ICD (implantable cardioverter-defibrillator) infection (HCC) 09/19/2020   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   Ischemic cardiomyopathy 10/31/2019   Non-ischemic cardiomyopathy (HCC) 10/31/2019   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   Chronic systolic congestive heart failure (HCC)    Unstable angina (HCC)    History of Guillain-Barre syndrome 12/19/2018   Acute systolic heart failure (HCC) 06/11/2018   Acute respiratory failure with hypoxia (HCC) 06/05/2018   Abnormal EKG 06/05/2018   Anxiety 06/05/2018   Duodenal stenosis    Gastric ulcer without hemorrhage or perforation    Iron deficiency anemia 09/25/2016   Peptic ulcer of stomach 09/25/2016   Duodenal ulcer 09/25/2016   NSAID long-term use       Review of Systems  All other systems reviewed and are negative.     Objective:     BP 122/84   Pulse 70   Temp (!) 97.2 F (36.2 C) (Axillary)   Ht 6' (1.829 m)   Wt 190 lb 4.8 oz (86.3 kg)   SpO2 97%   BMI 25.81 kg/m    Physical Exam Vitals reviewed.  Constitutional:      Appearance: Normal appearance. He is normal weight.  Eyes:     Conjunctiva/sclera: Conjunctivae normal.  Cardiovascular:     Rate and Rhythm: Normal rate  and regular rhythm.     Pulses: Normal pulses.     Heart sounds: Normal heart sounds. No murmur heard. Pulmonary:     Effort: Pulmonary effort is normal.     Breath sounds: Normal breath sounds. No wheezing.  Neurological:     Mental Status: He is alert and oriented to person, place, and time. Mental status is at baseline.  Psychiatric:        Mood and Affect: Mood normal.        Behavior: Behavior normal.      No results found for any visits on 06/25/23.    The 10-year ASCVD risk score (Arnett DK, et al., 2019) is: 4.4%    Assessment & Plan:  Anxiety Assessment & Plan: Chronic, symptoms improved. Pt  reports that the mirtazapine  is working well with minimal side effects to the medication. Weight is stable from previous. We will continue the 15 mg at bedtime as well as the 1/2-1 tablet daily as needed of the clonazepam . Refills sent to pharmacy. Medications reviewed.   Orders: -     clonazePAM ; Take 0.5-1 tablets (0.25-0.5 mg total) by mouth daily as needed for anxiety.  Dispense: 90 tablet; Refill: 1  Benign prostatic hyperplasia with hesitancy Assessment & Plan: Pt reports that the flomax  is working well to control his chronic symptoms, is able to sleep through the night without urinating on the medication, will continue 0.4 mg daily as prescribed.   Orders: -     Tamsulosin  HCl; Take 1 capsule (0.4 mg total) by mouth daily.  Dispense: 90 capsule; Refill: 1     Return in about 6 months (around 12/26/2023) for annual physical exam.    Aida House, MD

## 2023-06-25 NOTE — Assessment & Plan Note (Signed)
 Pt reports that the flomax  is working well to control his chronic symptoms, is able to sleep through the night without urinating on the medication, will continue 0.4 mg daily as prescribed.

## 2023-06-25 NOTE — Assessment & Plan Note (Addendum)
 Chronic, symptoms improved. Pt reports that the mirtazapine  is working well with minimal side effects to the medication. Weight is stable from previous. We will continue the 15 mg at bedtime as well as the 1/2-1 tablet daily as needed of the clonazepam . Refills sent to pharmacy. Medications reviewed.

## 2023-07-29 NOTE — Progress Notes (Signed)
 CARDIOLOGY CONSULT NOTE       [name] MRN: [id number] DOB/AGE: [birth year] 58 y.o.  Admit date: (Not on file) Referring Physician: Mitzie Anda Primary Physician: Aida House, MD Primary Cardiologist: CHF/EP  Taylor/Klein/Mclean Reason for Consultation: CHF    HPI:  58 y.o. 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 AICD (automatic cardioverter/defibrillator) present   . Anxiety   . CHF (congestive heart failure) (HCC)   . Gastric ulcer   . GERD (gastroesophageal reflux disease)   . Guillain Barr syndrome (HCC) 1984   COMPLETE RECOVERY; triggered from flu illness  . Headache(784.0)    hx of tension-none recent  . Hematemesis 09/07/2016  . History of kidney stones 09/19/2012   left ureteral stone-SURGERY 8/6 URETEROSCOPY / STENT PLACEMENT   . Partial gastric outlet obstruction   . UGIB (upper  gastrointestinal bleed) 09/07/2016    Family History  Problem Relation Age of Onset  . Heart disease Mother   . Congestive Heart Failure Mother   . Ulcerative colitis Father   . Hemachromatosis Father   . Rheum arthritis Father   . Heart disease Brother 48       stenting    Social History   Socioeconomic History  . Marital status: Married    Spouse name: Not on file  . Number of children: Not on file  . Years of education: Not on file  . Highest education level: Not on file  Occupational History  . Not on file  Tobacco Use  . Smoking status: Former    Current packs/day: 0.00    Average packs/day: 1.5 packs/day for 28.0 years (42.0 ttl pk-yrs)    Types: Cigarettes    Start date: 05/02/1990    Quit date: 05/02/2018    Years since quitting: 5.2  . Smokeless tobacco: Never  Vaping Use  . Vaping status: Never Used  Substance and Sexual Activity  . Alcohol use: Not Currently    Comment: occasional  . Drug use: No    Types: Marijuana    Comment: in the past  . Sexual activity: Not on file  Other Topics Concern  . Not on file  Social History Narrative  . Not on file   Social Drivers of Health   Financial Resource Strain: Not on file  Food  Insecurity: Not on file  Transportation Needs: Not on file  Physical Activity: Not on file  Stress: Not on file  Social Connections: Not on file  Intimate Partner Violence: Not on file    Past Surgical History:  Procedure Laterality Date  . BALLOON DILATION N/A 11/15/2016   Procedure: BALLOON DILATION;  Surgeon: Ace Holder, MD;  Location: [facility];  Service: Gastroenterology;  Laterality: N/A;  . CORONARY STENT INTERVENTION N/A 01/21/2019   Procedure: CORONARY STENT INTERVENTION;  Surgeon: Odie Benne, MD;  Location: [facility]AS[facility] CV LAB;  Service: Cardiovascular;  Laterality: N/A;  . CYSTO, LEFT RGP, DIAGNOSTIC URETEROSCOPY AND STENT PLACEMENT  09/24/2012  . CYSTOSCOPY WITH RETROGRADE PYELOGRAM, URETEROSCOPY  AND STENT PLACEMENT Left 09/24/2012   Procedure: CYSTOSCOPY WITH RETROGRADE PYELOGRAM, DIAGNOSTIC URETEROSCOPY AND STENT PLACEMENT;  Surgeon: Osborn Blaze, MD;  Location: [facility];  Service: Urology;  Laterality: Left;  . CYSTOSCOPY WITH RETROGRADE PYELOGRAM, URETEROSCOPY AND STENT PLACEMENT Left 10/08/2012   Procedure: CYSTOSCOPY WITH RETROGRADE PYELOGRAM, URETEROSCOPY AND STENT PLACEMENT;  Surgeon: Osborn Blaze, MD;  Location: [facility];  Service: Urology;  Laterality: Left;  1 HR NEEDS DIGITAL URETEROSCOPE   . ESOPHAGOGASTRODUODENOSCOPY N/A 09/08/2016   Procedure: ESOPHAGOGASTRODUODENOSCOPY (EGD);  Surgeon: Danette Duos, MD;  Location: [facility];  Service: Gastroenterology;  Laterality: N/A;  . ESOPHAGOGASTRODUODENOSCOPY N/A 11/15/2016   Procedure: ESOPHAGOGASTRODUODENOSCOPY (EGD);  Surgeon: Ace Holder, MD;  Location: [facility];  Service: Gastroenterology;  Laterality: N/A;  . ESOPHAGOGASTRODUODENOSCOPY N/A 12/31/2016   Procedure: ESOPHAGOGASTRODUODENOSCOPY (EGD);  Surgeon: Ace Holder, MD;  Location: [facility];  Service: Gastroenterology;  Laterality: N/A;  . ESOPHAGOGASTRODUODENOSCOPY (EGD) WITH PROPOFOL  N/A 10/15/2016   Procedure: ESOPHAGOGASTRODUODENOSCOPY (EGD) WITH PROPOFOL ; Duodenal dilation;  Surgeon: Danette Duos, MD;  Location: [facility]COPY;  Service: Gastroenterology;  Laterality: N/A;  . HOLMIUM LASER APPLICATION Left 10/08/2012   Procedure: HOLMIUM LASER APPLICATION;  Surgeon: Osborn Blaze, MD;  Location: [facility];  Service: Urology;  Laterality: Left;  . ICD IMPLANT N/A 07/06/2019   Procedure: ICD IMPLANT;  Surgeon: Verona Goodwill, MD;  Location: [facility] INVASIVE CV LAB;  Service: Cardiovascular;  Laterality: N/A;  . ICD LEAD REMOVAL N/A 09/19/2020   Procedure: ICD SYSTEM EXTRACTION;  Surgeon: Tammie Fall, MD;  Location: [facility] OR;  Service: Cardiovascular;  Laterality: N/A;  . RIGHT/LEFT HEART CATH AND CORONARY ANGIOGRAPHY N/A 01/21/2019    Procedure: RIGHT/LEFT HEART CATH AND CORONARY ANGIOGRAPHY;  Surgeon: Odie Benne, MD;  [facility]: [facility];  Service: Cardiovascular;  Laterality: N/A;  . SAVORY DILATION N/A 12/31/2016   Procedure: POSSIBLE SAVORY DILATION;  Surgeon: Ace Holder, MD;  Location: [facility];  Service: Gastroenterology;  Laterality: N/A;  . TEE WITHOUT CARDIOVERSION  09/19/2020   Procedure: TRANSESOPHAGEAL ECHOCARDIOGRAM (TEE);  Surgeon: Tammie Fall, MD;  Location: [facility] OR;  Service: Cardiovascular;;  . TONSILLECTOMY     t&a as a child  . wisdom teeth extracted        Current Outpatient Medications:  .  acetaminophen  (TYLENOL ) 500 MG tablet, Take 1,000 mg 2 (two) times daily as needed by mouth for moderate pain., Disp: , Rfl:  .  ASPIRIN  LOW DOSE 81 MG tablet, TAKE 1 TABLET BY MOUTH EVERY DAY, Disp: 90 tablet, Rfl: 0 .  atorvastatin  (LIPITOR ) 80 MG tablet, TAKE 1 TABLET BY MOUTH EVERY DAY, Disp: 90 tablet, Rfl: 1 .  carvedilol  (COREG ) 25 MG tablet, TAKE 1 TABLET (25 MG TOTAL) BY MOUTH TWICE A DAY WITH MEALS, Disp:  180 tablet, Rfl: 3 .  clonazePAM  (KLONOPIN ) 0.5 MG tablet, Take 0.5-1 tablets (0.25-0.5 mg total) by mouth daily as needed for anxiety., Disp: 90 tablet, Rfl: 1 .  dapagliflozin  propanediol (FARXIGA ) 10 MG TABS tablet, TAKE 1 TABLET BY MOUTH EVERY DAY, Disp: 30 tablet, Rfl: 5 .  ENTRESTO  97-103 MG, TAKE 1 TABLET BY MOUTH TWICE A DAY, Disp: 180 tablet, Rfl: 0 .  mirtazapine  (REMERON  SOL-TAB) 15 MG disintegrating tablet, Take 1 tablet (15 mg total) by mouth at bedtime. TAKE  1 TABLET AT BEDTIME IF NEEDED, Disp: 90 tablet, Rfl: 0 .  Multiple Vitamins-Minerals (MULTIVITAMIN WITH MINERALS) tablet, Take 1 tablet by mouth 3 (three) times a week., Disp: , Rfl:  .  omeprazole  (PRILOSEC  OTC) 20 MG tablet, Take 20 mg by mouth daily., Disp: , Rfl:  .  spironolactone  (ALDACTONE ) 25 MG tablet, TAKE 1 TABLET (25 MG TOTAL) BY MOUTH DAILY., Disp: 90 tablet, Rfl: 0 .  tamsulosin  (FLOMAX )  0.4 MG CAPS capsule, Take 1 capsule (0.4 mg total) by mouth daily., Disp: 90 capsule, Rfl: 1    Physical Exam: Blood pressure 116/70, pulse 66, resp. rate 16, height 6' (1.829 m), weight 185 lb (83.9 kg), SpO2 95%.   Affect appropriate Healthy:  appears stated age HEENT: normal Neck supple with no adenopathy JVP normal no bruits no thyromegaly Lungs clear with no wheezing and good diaphragmatic motion Heart:  S1/S2 no murmur, no rub, gallop or click PMI normal Previous AICD pocket under left subclavian Abdomen: benighn, BS positve, no tenderness, no AAA no bruit.  No HSM or HJR Distal pulses intact with no bruits No edema Neuro non-focal Skin warm and dry No muscular weakness   Labs:   Lab Results  Component Value Date   WBC 7.8 06/05/2022   HGB 15.7 06/05/2022   HCT 45.9 06/05/2022   MCV 98.1 06/05/2022   PLT 253.0 06/05/2022   No results for input(s): NA, K, CL, CO2, BUN, CREATININE, CALCIUM , PROT, BILITOT, ALKPHOS, ALT, AST, GLUCOSE in the last 168 hours.  Invalid input(s): LABALBU Lab Results  Component Value Date   TROPONINI <0.03 06/06/2018    Lab Results  Component Value Date   CHOL 148 04/22/2023   CHOL 144 06/05/2022   CHOL 139 10/14/2020   Lab Results  Component Value Date   HDL 40 (L) 04/22/2023   HDL 40.90 06/05/2022   HDL 36 (L) 10/14/2020   Lab Results  Component Value Date   LDLCALC 83 04/22/2023   LDLCALC 68 06/05/2022   LDLCALC 69 10/14/2020   Lab Results  Component Value Date   TRIG 127 04/22/2023   TRIG 173.0 (H) 06/05/2022   TRIG 170 (H) 10/14/2020   Lab Results  Component Value Date   CHOLHDL 3.7 04/22/2023   CHOLHDL 4 06/05/2022   CHOLHDL 3.9 10/14/2020   No results found for: LDLDIRECT    Radiology: No results found.  EKG: SR rate 77 Q waves 3,F 10/25/20  08/05/2023 NSR rate 70 normal    ASSESSMENT AND PLAN:   DCM:  Mixed with some CAD and improvement to EF 35% on last TTE  10/08/22  Functional class 1 continue current medication  CAD:  01/21/19 stenting of LCX/OM2 and distal RCA/PLB continue ASA and statin  AIDC:  post explant infected device not replaced  GERD: continue low carb diet and prilosec  5.   GAD:  due to CHF on clonazepam  6.   BPH:  on flomax   Cardiac MRI for scar burden and EF  F/U in 6 months   Signed: Janelle Mediate 08/05/2023, 8:52 AM

## 2023-08-03 ENCOUNTER — Other Ambulatory Visit: Payer: Self-pay | Admitting: Cardiovascular Disease

## 2023-08-05 ENCOUNTER — Encounter: Payer: Self-pay | Admitting: Cardiovascular Disease

## 2023-08-05 ENCOUNTER — Ambulatory Visit: Payer: Managed Care, Other (non HMO) | Attending: Cardiovascular Disease | Admitting: Cardiovascular Disease

## 2023-08-05 VITALS — BP 116/70 | HR 66 | Resp 16 | Ht 72.0 in | Wt 185.0 lb

## 2023-08-05 DIAGNOSIS — I255 Ischemic cardiomyopathy: Secondary | ICD-10-CM

## 2023-08-05 DIAGNOSIS — I5022 Chronic systolic (congestive) heart failure: Secondary | ICD-10-CM | POA: Diagnosis not present

## 2023-08-05 DIAGNOSIS — E785 Hyperlipidemia, unspecified: Secondary | ICD-10-CM

## 2023-08-05 NOTE — Patient Instructions (Signed)
 Medication Instructions:  No changes  *If you need a refill on your cardiac medications before your next appointment, please call your pharmacy*  Lab Work: Blood work within 2 weeks of cardiac MRI (bmet, H&H)  Testing/Procedures: Cardiac MRI - see instructions below  Follow-Up: At [facility], you and your health needs are our priority.  As part of our continuing mission to provide you with exceptional heart care, our providers are all part of one team.  This team includes your primary Cardiologist (physician) and Advanced Practice Providers or APPs (Physician Assistants and Nurse Practitioners) who all work together to provide you with the care you need, when you need it.  Your next appointment:   12 month(s)  Provider:   Janelle Mediate, MD    Other Instructions   You are scheduled for Cardiac MRI at the location below.  Please arrive for your appointment at ______________ . ?  [facility] [address] [place], [place] [address] Please take advantage of the free valet parking available at the [facility] and [employer] (Entrance C).  Proceed to the [facility] Radiology Department (First Floor) for check-in.   OR   [facility] [address] [place], [place] [address] Please go to the [facility] and check-in with the desk attendant.   Magnetic resonance imaging (MRI) is a painless test that produces images of the inside of the body without using Xrays.  During an MRI, strong magnets and radio waves work together in a Data processing manager to form detailed images.   MRI images may provide more details about a medical condition than X-rays, CT scans, and ultrasounds can provide.  You may be given earphones to listen for instructions.  You may eat a light breakfast and take medications as ordered with the exception of furosemide , hydrochlorothiazide, chlorthalidone or spironolactone  (or any other fluid pill). If you are  undergoing a stress MRI, please avoid stimulants for 12 hr prior to test. (I.e. Caffeine, nicotine , chocolate, or antihistamine medications)  If your provider has ordered anti-anxiety medications for this test, then you will need a driver.  An IV will be inserted into one of your veins. Contrast material will be injected into your IV. It will leave your body through your urine within a day. You may be told to drink plenty of fluids to help flush the contrast material out of your system.  You will be asked to remove all metal, including: Watch, jewelry, and other metal objects including hearing aids, hair pieces and dentures. Also wearable glucose monitoring systems (ie. Freestyle Libre and Omnipods) (Braces and fillings normally are not a problem.)   TEST WILL TAKE APPROXIMATELY 1 HOUR  PLEASE NOTIFY SCHEDULING AT LEAST 24 HOURS IN ADVANCE IF YOU ARE UNABLE TO KEEP YOUR APPOINTMENT. [phone]  For more information and frequently asked questions, please visit our website : [email or web address]  Please call the Cardiac Imaging Nurse Navigators with any questions/concerns. [phone] Office

## 2023-08-08 ENCOUNTER — Other Ambulatory Visit: Payer: Self-pay | Admitting: Family Medicine

## 2023-08-08 ENCOUNTER — Other Ambulatory Visit: Payer: Self-pay | Admitting: Cardiovascular Disease

## 2023-08-08 DIAGNOSIS — N401 Enlarged prostate with lower urinary tract symptoms: Secondary | ICD-10-CM

## 2023-09-08 ENCOUNTER — Other Ambulatory Visit: Payer: Self-pay | Admitting: Family Medicine

## 2023-09-08 DIAGNOSIS — F419 Anxiety disorder, unspecified: Secondary | ICD-10-CM

## 2023-09-24 ENCOUNTER — Ambulatory Visit: Payer: Self-pay | Admitting: Cardiovascular Disease

## 2023-09-24 LAB — BASIC METABOLIC PANEL WITH GFR
BUN/Creatinine Ratio: 22 — ABNORMAL HIGH (ref 9–20)
BUN: 17 mg/dL (ref 6–24)
CO2: 21 mmol/L (ref 20–29)
Calcium: 9.9 mg/dL (ref 8.7–10.2)
Chloride: 104 mmol/L (ref 96–106)
Creatinine, Ser: 0.79 mg/dL (ref 0.76–1.27)
Glucose: 99 mg/dL (ref 70–99)
Potassium: 4.3 mmol/L (ref 3.5–5.2)
Sodium: 142 mmol/L (ref 134–144)
eGFR: 104 mL/min/1.73 (ref >59)

## 2023-10-02 ENCOUNTER — Encounter (HOSPITAL_COMMUNITY): Payer: Self-pay

## 2023-10-04 ENCOUNTER — Other Ambulatory Visit: Payer: Self-pay | Admitting: Cardiovascular Disease

## 2023-10-04 ENCOUNTER — Ambulatory Visit (HOSPITAL_COMMUNITY)
Admission: RE | Admit: 2023-10-04 | Discharge: 2023-10-04 | Disposition: A | Discharge status: Home / Self Care | Source: Ambulatory Visit | Attending: Cardiovascular Disease | Admitting: Cardiovascular Disease

## 2023-10-04 DIAGNOSIS — I255 Ischemic cardiomyopathy: Secondary | ICD-10-CM | POA: Insufficient documentation

## 2023-10-04 MED ORDER — GADOBUTROL 1 MMOL/ML IV SOLN
10.0000 mL | Freq: Once | INTRAVENOUS | Status: AC | PRN
  Administered 2023-10-04: 10 mL via INTRAVENOUS

## 2023-11-12 ENCOUNTER — Other Ambulatory Visit (HOSPITAL_COMMUNITY): Payer: Self-pay | Admitting: Cardiology

## 2023-11-17 ENCOUNTER — Other Ambulatory Visit (HOSPITAL_COMMUNITY): Payer: Self-pay | Admitting: Cardiology

## 2023-12-22 ENCOUNTER — Other Ambulatory Visit: Payer: Self-pay | Admitting: Family Medicine

## 2023-12-22 DIAGNOSIS — N401 Enlarged prostate with lower urinary tract symptoms: Secondary | ICD-10-CM

## 2023-12-23 ENCOUNTER — Encounter (HOSPITAL_COMMUNITY): Payer: Self-pay | Admitting: Cardiology

## 2023-12-23 ENCOUNTER — Ambulatory Visit (HOSPITAL_COMMUNITY)
Admission: RE | Admit: 2023-12-23 | Discharge: 2023-12-23 | Disposition: A | Discharge status: Home / Self Care | Source: Ambulatory Visit | Attending: Cardiology | Admitting: Cardiology

## 2023-12-23 ENCOUNTER — Ambulatory Visit (HOSPITAL_COMMUNITY): Payer: Self-pay | Admitting: Cardiology

## 2023-12-23 VITALS — BP 104/70 | HR 70

## 2023-12-23 DIAGNOSIS — Z7982 Long term (current) use of aspirin: Secondary | ICD-10-CM | POA: Diagnosis not present

## 2023-12-23 DIAGNOSIS — I2510 Atherosclerotic heart disease of native coronary artery without angina pectoris: Secondary | ICD-10-CM | POA: Insufficient documentation

## 2023-12-23 DIAGNOSIS — I5022 Chronic systolic (congestive) heart failure: Secondary | ICD-10-CM | POA: Insufficient documentation

## 2023-12-23 DIAGNOSIS — Z79899 Other long term (current) drug therapy: Secondary | ICD-10-CM | POA: Diagnosis not present

## 2023-12-23 DIAGNOSIS — Z7984 Long term (current) use of oral hypoglycemic drugs: Secondary | ICD-10-CM | POA: Diagnosis not present

## 2023-12-23 DIAGNOSIS — Z955 Presence of coronary angioplasty implant and graft: Secondary | ICD-10-CM | POA: Insufficient documentation

## 2023-12-23 LAB — BASIC METABOLIC PANEL WITH GFR
Anion gap: 10 (ref 5–15)
BUN: 15 mg/dL (ref 6–20)
CO2: 26 mmol/L (ref 22–32)
Calcium: 9.1 mg/dL (ref 8.9–10.3)
Chloride: 104 mmol/L (ref 98–111)
Creatinine, Ser: 0.51 mg/dL — ABNORMAL LOW (ref 0.61–1.24)
GFR, Estimated: >60 mL/min (ref >60)
Glucose, Bld: 107 mg/dL — ABNORMAL HIGH (ref 70–99)
Potassium: 3.7 mmol/L (ref 3.5–5.1)
Sodium: 140 mmol/L (ref 135–145)

## 2023-12-23 LAB — BRAIN NATRIURETIC PEPTIDE: B Natriuretic Peptide: 13.5 pg/mL (ref 0.0–100.0)

## 2023-12-23 MED ORDER — EZETIMIBE 10 MG PO TABS: 10.0000 mg | ORAL_TABLET | Freq: Every day | ORAL | 3 refills | Status: AC

## 2023-12-23 NOTE — Progress Notes (Signed)
 ADVANCED HE[facility] FAILURE CLINIC NOTE  PCP: Ozell Heron HERO, MD Primary Cardiology: Dr. Delford HF Cardiology: Dr. Rolan  Chief Complaint: Heart Failure Follow-up  HPI: 58 y.o. with pmh of chronic systolic CHF and CAD.   Cardiac history noted in 4/20. cMRI in 10/20 showed EF 24%, moderately decreased RV systolic function, and full thickness LGE in the basal-mid inferior wall concerning for prior MI. R/LHC in 12/20 showed normal filling pressures and cardiac output.  He had DES to mid LCx/OM2 and distal RCA.    Echo 3/21 showed EF 20-25% with diffuse hypokinesis and mildly decreased RV systolic function.   He had [employer] ICD placed in 5/21, but developed an ICD pocket infection, and device was removed in 8/22.  Echo in 9/22 showed EF 40%, gHK, RV normal. Echo in 10/23 showed EF 35-40%, normal RV.   Echo 8/24 showed EF 35%, gHK, G1DD, mildly reduced RV, mild MR.  Cardiac MRI in 8/25 showed LV EF 40%, mid lateral wall akinesis and thinning, RV EF 52%, LGE pattern suggestive of prior MI in the mid inferolateral and mid anterolateral walls corresponding to WMAs.   Today he returns for HF follow up. He is doing well overall.  No exertional dyspnea or chest pain.  No orthopnea/PND.  No lightheadedness or palpitations.  Weight trending down.  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   EKG (personally reviewed): NSR, normal  Labs (3/25): LDL 83 Labs (8/25): K 4.3, creatinine 0.79  PMH: 1. H/o Guillain Barre syndrome with recovery in 1984.  2. GERD 3. Nephrolithiasis 4. PUD 5. 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  Echo (4/20) with EF 20%, moderate to severe LV dilation, moderately decreased RV systolic function, mild-moderate MR.  - cMRI (10/20): EF 24%, severe LV dilation, diffuse hypokinesis, full thickness scar basal-mid inferolateral wall suggestive of prior MI.  - L/RHC (12/20): 90% mLCx/OM2  stenosis treated with DES, 80% distal RCA stenosis treated with DES.  Mean RA 1, mean PA 14, mean PCWP 9, CI 2.63.  - Echo (3/21): EF 20-25% - [employer] ICD placed but developed pocket infection and device was removed in 8/22.  - Echo (10/23): EF 35-40%, normal RV. - Echo (8/24): EF 35%, gHK, G1DD, mildly reduced RV, mild MR.  - Cardiac MRI (8/25): LV EF 40%, mid lateral wall akinesis and thinning, RV EF 52%, LGE pattern suggestive of prior MI in the mid inferolateral and mid anterolateral walls corresponding to WMAs.  6. Prior smoker.  7. CAD: LHC (12/20) with 90% mLCx/OM2 stenosis treated with DES, 80% distal RCA stenosis treated with DES.  - Dyspnea with ticagrelor .   FH: Mother with CHF developed in her 16s, father with PCI, brother with PCI.   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  No ETOH. Married, lives in [place].  Plays in a classic rock band.   ROS: All systems reviewed and negative except as per HPI.   Current Outpatient Medications  Medication Sig Dispense Refill   acetaminophen  (TYLENOL ) 500 MG tablet Take 1,000 mg 2 (two) times daily as needed by mouth for moderate pain.     ASPIRIN  LOW DOSE 81 MG tablet TAKE 1 TABLET BY MOUTH EVERY DAY 90 tablet 0   atorvastatin  (LIPITOR ) 80 MG tablet TAKE 1 TABLET BY MOUTH EVERY DAY 90 tablet 3   carvedilol  (COREG ) 25 MG tablet TAKE 1 TABLET BY MOUTH TWICE  A DAY WITH MEALS 180 tablet 3   clonazePAM  (KLONOPIN ) 0.5 MG tablet Take 0.5-1 tablets (0.25-0.5 mg total) by mouth daily as needed for anxiety. 90 tablet 1   ezetimibe (ZETIA) 10 MG tablet Take 1 tablet (10 mg total) by mouth daily. 90 tablet 3   FARXIGA  10 MG TABS tablet TAKE 1 TABLET BY MOUTH EVERY DAY 30 tablet 5   mirtazapine  (REMERON  SOL-TAB) 15 MG disintegrating tablet TAKE 1 TABLET BY MOUTH AT BEDTIME AS NEEDED 90 tablet 1   Multiple Vitamins-Minerals (MULTIVITAMIN WITH MINERALS) tablet Take 1 tablet by mouth 3 (three) times a week.      omeprazole  (PRILOSEC  OTC) 20 MG tablet Take 20 mg by mouth daily.     sacubitril -valsartan  (ENTRESTO ) 97-103 MG TAKE 1 TABLET BY MOUTH TWICE A DAY 180 tablet 2   spironolactone  (ALDACTONE ) 25 MG tablet TAKE 1 TABLET (25 MG TOTAL) BY MOUTH DAILY. 90 tablet 2   tamsulosin  (FLOMAX ) 0.4 MG CAPS capsule TAKE 1 CAPSULE BY MOUTH EVERY DAY 90 capsule 0   No current facility-administered medications for this encounter.   BP 104/70   Pulse 70   SpO2 96%   There were no vitals filed for this visit.  Physical Exam: General: NAD Neck: No JVD, no thyromegaly or thyroid nodule.  Lungs: Clear to auscultation bilaterally with normal respiratory effort. CV: Nondisplaced PMI.  Heart regular S1/S2, no S3/S4, no murmur.  No peripheral edema.  No carotid bruit.  Normal pedal pulses.  Abdomen: Soft, nontender, no hepatosplenomegaly, no distention.  Skin: Intact without lesions or rashes.  Neurologic: Alert and oriented x 3.  Psych: Normal affect. Extremities: No clubbing or cyanosis.  HEENT: Normal.   Assessment/Plan: 1. Chronic systolic CHF:  Suspect ischemic cardiomyopathy. [employer] ICD was placed 5/21. Explanted in 8/22 due to pocket site infection.  Echo  9/22 showed improved EF 40%. Echo 10/23 and 8/24 with stable EF 35-40%.  Cardiac MRI in 8/25 showed LV EF 40%, mid lateral wall akinesis and thinning, RV EF 52%, LGE pattern suggestive of prior MI in the mid inferolateral and mid anterolateral walls corresponding to WMAs.  NYHA class I symptoms, not volume overloaded on exam.  - Continue Coreg  25 mg bid.  - Continue Entresto  97/103 bid.  - Continue spironolactone  25 mg daily.  BMET/BNP today.  - Continue dapagliflozin  10 mg daily - EF appears to be out of ICD range at this point.  2. CAD: 12/20 PCI to mid LCx/OM2 and distal RCA.  No chest pain.  - Continue ASA 81 daily.  - Continue atorva 80 mg daily.  - Last lipids with LDL > goal 55, I will start Zetia 10 mg daily.  Check lipids in 2  months.   Follow up in 6 months with APP  I spent 32 minutes reviewing records, interviewing/examining patient, and managing orders.    Ezra Shuck 12/23/2023

## 2023-12-23 NOTE — Patient Instructions (Signed)
 START Zetia 10 mg daily.  Labs done today, your results will be available in MyChart, we will contact you for abnormal readings.  REPEAT blood work in 2 months.  Your physician recommends that you schedule a follow-up appointment in: 6 months.  If you have any questions or concerns before your next appointment please send us  a message through [facility] or call our office at [phone].    TO LEAVE A MESSAGE FOR THE NURSE SELECT OPTION 2, PLEASE LEAVE A MESSAGE INCLUDING: YOUR NAME DATE OF BIRTH CALL BACK NUMBER REASON FOR CALL**this is important as we prioritize the call backs  YOU WILL RECEIVE A CALL BACK THE SAME DAY AS LONG AS YOU CALL BEFORE 4:00 PM  At the [facility], you and your health needs are our priority. As part of our continuing mission to provide you with exceptional heart care, we have created designated Provider Care Teams. These Care Teams include your primary Cardiologist (physician) and Advanced Practice Providers (APPs- Physician Assistants and Nurse Practitioners) who all work together to provide you with the care you need, when you need it.   You may see any of the following providers on your designated Care Team at your next follow up: Dr Toribio Fuel Dr Ezra Shuck Dr. Morene Brownie Greig Mosses, NP Caffie Shed, [place] [facility] [place], [place] Beckey Coe, NP Jordan Lee, NP Ellouise Class, NP Tinnie Redman, PharmD Jaun Bash, PharmD   Please be sure to bring in all your medications bottles to every appointment.    Thank you for choosing [facility]

## 2023-12-25 ENCOUNTER — Other Ambulatory Visit: Payer: Self-pay | Admitting: Family Medicine

## 2023-12-25 DIAGNOSIS — F419 Anxiety disorder, unspecified: Secondary | ICD-10-CM

## 2023-12-27 ENCOUNTER — Encounter: Payer: Self-pay | Admitting: Family Medicine

## 2023-12-27 ENCOUNTER — Ambulatory Visit (INDEPENDENT_AMBULATORY_CARE_PROVIDER_SITE_OTHER): Admitting: Family Medicine

## 2023-12-27 VITALS — BP 108/70 | HR 85 | Temp 97.6°F | Ht 71.25 in | Wt 176.5 lb

## 2023-12-27 DIAGNOSIS — Z0000 Encounter for general adult medical examination without abnormal findings: Secondary | ICD-10-CM

## 2023-12-27 DIAGNOSIS — Z87891 Personal history of nicotine dependence: Secondary | ICD-10-CM

## 2023-12-27 NOTE — Patient Instructions (Addendum)
 Pumpkin seed oil -- 3 grams daily  Saw Palmetto -- 300 mg daily ([place] 's wild harvest brand on [employer])  Health Maintenance, Male Adopting a healthy lifestyle and getting preventive care are important in promoting health and wellness. Ask your health care provider about: The right schedule for you to have regular tests and exams. Things you can do on your own to prevent diseases and keep yourself healthy. What should I know about diet, weight, and exercise? Eat a healthy diet  Eat a diet that includes plenty of vegetables, fruits, low-fat dairy products, and lean protein. Do not eat a lot of foods that are high in solid fats, added sugars, or sodium. Maintain a healthy weight Body mass index (BMI) is a measurement that can be used to identify possible weight problems. It estimates body fat based on height and weight. Your health care provider can help determine your BMI and help you achieve or maintain a healthy weight. Get regular exercise Get regular exercise. This is one of the most important things you can do for your health. Most adults should: Exercise for at least 150 minutes each week. The exercise should increase your heart rate and make you sweat (moderate-intensity exercise). Do strengthening exercises at least twice a week. This is in addition to the moderate-intensity exercise. Spend less time sitting. Even light physical activity can be beneficial. Watch cholesterol and blood lipids Have your blood tested for lipids and cholesterol at 56 years of age, then have this test every 5 years. You may need to have your cholesterol levels checked more often if: Your lipid or cholesterol levels are high. You are older than 56 years of age. You are at high risk for heart disease. What should I know about cancer screening? Many types of cancers can be detected early and may often be prevented. Depending on your health history and family history, you may need to have cancer screening at  various ages. This may include screening for: Colorectal cancer. Prostate cancer. Skin cancer. Lung cancer. What should I know about heart disease, diabetes, and high blood pressure? Blood pressure and heart disease High blood pressure causes heart disease and increases the risk of stroke. This is more likely to develop in people who have high blood pressure readings or are overweight. Talk with your health care provider about your target blood pressure readings. Have your blood pressure checked: Every 3-5 years if you are 37-66 years of age. Every year if you are 26 years old or older. If you are between the ages of 39 and 64 and are a current or former smoker, ask your health care provider if you should have a one-time screening for abdominal aortic aneurysm (AAA). Diabetes Have regular diabetes screenings. This checks your fasting blood sugar level. Have the screening done: Once every three years after age 74 if you are at a normal weight and have a low risk for diabetes. More often and at a younger age if you are overweight or have a high risk for diabetes. What should I know about preventing infection? Hepatitis B If you have a higher risk for hepatitis B, you should be screened for this virus. Talk with your health care provider to find out if you are at risk for hepatitis B infection. Hepatitis C Blood testing is recommended for: Everyone born from [birth year] through 1963. Anyone with known risk factors for hepatitis C. Sexually transmitted infections (STIs) You should be screened each year for STIs, including gonorrhea and chlamydia, if: You  are sexually active and are younger than 56 years of age. You are older than 56 years of age and your health care provider tells you that you are at risk for this type of infection. Your sexual activity has changed since you were last screened, and you are at increased risk for chlamydia or gonorrhea. Ask your health care provider if you are at  risk. Ask your health care provider about whether you are at high risk for HIV. Your health care provider may recommend a prescription medicine to help prevent HIV infection. If you choose to take medicine to prevent HIV, you should first get tested for HIV. You should then be tested every 3 months for as long as you are taking the medicine. Follow these instructions at home: Alcohol use Do not drink alcohol if your health care provider tells you not to drink. If you drink alcohol: Limit how much you have to 0-2 drinks a day. Know how much alcohol is in your drink. In the [place] one drink equals one 12 oz bottle of beer (355 mL), one 5 oz glass of wine (148 mL), or one 1 oz glass of hard liquor (44 mL). Lifestyle Do not use any products that contain nicotine  or tobacco. These products include cigarettes, chewing tobacco, and vaping devices, such as e-cigarettes. If you need help quitting, ask your health care provider. Do not use street drugs. Do not share needles. Ask your health care provider for help if you need support or information about quitting drugs. General instructions Schedule regular health, dental, and eye exams. Stay current with your vaccines. Tell your health care provider if: You often feel depressed. You have ever been abused or do not feel safe at home. Summary Adopting a healthy lifestyle and getting preventive care are important in promoting health and wellness. Follow your health care provider's instructions about healthy diet, exercising, and getting tested or screened for diseases. Follow your health care provider's instructions on monitoring your cholesterol and blood pressure. This information is not intended to replace advice given to you by your health care provider. Make sure you discuss any questions you have with your health care provider. Document Revised: 06/27/2020 Document Reviewed: 06/27/2020 Elsevier Patient Education  2024 [employer].

## 2023-12-27 NOTE — Progress Notes (Signed)
 Complete physical exam  Patient: Austin [name]   DOB: 04/19/1965   58 y.o. Male  MRN: [id number]  Subjective:    Chief Complaint  Patient presents with   Annual Exam    Zackary Mckeone is a 58 y.o. male who presents today for a complete physical exam. He reports consuming a regular diet. 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 Does drink some regular soda, is trying to cut out snack food.  Home exercise routine includes walking 2 hrs per week. He generally feels well. He reports sleeping fairly well. He does not have additional problems to discuss today.    Most recent fall risk assessment:    03/10/2019    1:33 PM  Fall Risk   Falls in the past year? 0   Follow up Falls evaluation completed      Data saved with a previous flowsheet row definition     Most recent depression screenings:    12/27/2023   10:42 AM 06/25/2023    9:33 AM  PHQ 2/9 Scores  PHQ - 2 Score 0 1  PHQ- 9 Score 3 2      Data saved with a previous flowsheet row definition    Vision:hasn't seen one recently, does wear readers but otherwise has no vision issues to report and Dental: No current dental problems and Receives regular dental care  Patient Active Problem List   Diagnosis Date Noted   Benign prostatic hyperplasia with hesitancy 11/27/2021   ICD (implantable cardioverter-defibrillator) infection 09/19/2020   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/2020   Ischemic cardiomyopathy 10/31/2019   Non-ischemic cardiomyopathy (HCC) 10/31/2019   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   Chronic systolic congestive heart failure (HCC)    Unstable angina (HCC)    History of Guillain-Barre syndrome 12/19/2018   Acute systolic heart failure (HCC) 06/11/2018   Acute respiratory failure with hypoxia (HCC) 06/05/2018   Abnormal EKG 06/05/2018   Anxiety 06/05/2018   Duodenal stenosis    Gastric ulcer without hemorrhage or perforation     Iron deficiency anemia 09/25/2016   Peptic ulcer of stomach 09/25/2016   Duodenal ulcer 09/25/2016   NSAID long-term use       Patient Care Team: Ozell Heron HERO, MD as PCP - General (Family Medicine) Delford Maude BROCKS, MD as PCP - Cardiology (Cardiology)   Outpatient Medications Prior to Visit  Medication Sig   acetaminophen  (TYLENOL ) 500 MG tablet Take 1,000 mg 2 (two) times daily as needed by mouth for moderate pain.   ASPIRIN  LOW DOSE 81 MG tablet TAKE 1 TABLET BY MOUTH EVERY DAY   atorvastatin  (LIPITOR ) 80 MG tablet TAKE 1 TABLET BY MOUTH EVERY DAY   carvedilol  (COREG ) 25 MG tablet TAKE 1 TABLET BY MOUTH TWICE A DAY WITH MEALS   clonazePAM  (KLONOPIN ) 0.5 MG tablet TAKE 0.5-1 TABLETS (0.25-0.5 MG TOTAL) BY MOUTH DAILY AS NEEDED FOR ANXIETY   ezetimibe (ZETIA) 10 MG tablet Take 1 tablet (10 mg total) by mouth daily.   FARXIGA  10 MG TABS tablet TAKE 1 TABLET BY MOUTH EVERY DAY   MELATONIN PO Take by mouth as needed.   mirtazapine  (REMERON  SOL-TAB) 15 MG disintegrating tablet TAKE 1 TABLET BY MOUTH AT BEDTIME AS NEEDED   Multiple Vitamins-Minerals (MULTIVITAMIN WITH MINERALS) tablet Take 1 tablet by mouth 3 (three) times a week.   omeprazole  (PRILOSEC  OTC) 20 MG tablet Take 20 mg by mouth daily.  sacubitril -valsartan  (ENTRESTO ) 97-103 MG TAKE 1 TABLET BY MOUTH TWICE A DAY   spironolactone  (ALDACTONE ) 25 MG tablet TAKE 1 TABLET (25 MG TOTAL) BY MOUTH DAILY.   tamsulosin  (FLOMAX ) 0.4 MG CAPS capsule TAKE 1 CAPSULE BY MOUTH EVERY DAY   No facility-administered medications prior to visit.    Review of Systems  HENT:  Negative for hearing loss.   Eyes:  Negative for blurred vision.  Respiratory:  Negative for shortness of breath.   Cardiovascular:  Negative for chest pain.  Gastrointestinal: Negative.   Genitourinary: Negative.   Musculoskeletal:  Negative for back pain.  Neurological:  Negative for headaches.  Psychiatric/Behavioral:  Negative for depression.   All other  systems reviewed and are negative.      Objective:     BP 108/70   Pulse 85   Temp 97.6 F (36.4 C) (Oral)   Ht 5' 11.25 (1.81 m)   Wt 176 lb 8 oz (80.1 kg)   SpO2 98%   BMI 24.44 kg/m    Physical Exam Vitals reviewed.  Constitutional:      Appearance: Normal appearance. He is well-groomed and normal weight.  HENT:     Right Ear: Tympanic membrane and ear canal normal.     Left Ear: Tympanic membrane and ear canal normal.     Mouth/Throat:     Mouth: Mucous membranes are moist.     Pharynx: No posterior oropharyngeal erythema.  Eyes:     Extraocular Movements: Extraocular movements intact.     Conjunctiva/sclera: Conjunctivae normal.  Neck:     Thyroid: No thyromegaly.  Cardiovascular:     Rate and Rhythm: Normal rate and regular rhythm.     Heart sounds: S1 normal and S2 normal. No murmur heard. Pulmonary:     Effort: Pulmonary effort is normal.     Breath sounds: Normal breath sounds and air entry. No rales.  Abdominal:     General: Abdomen is flat. Bowel sounds are normal.  Musculoskeletal:     Right lower leg: No edema.     Left lower leg: No edema.  Lymphadenopathy:     Cervical: No cervical adenopathy.  Neurological:     General: No focal deficit present.     Mental Status: He is alert and oriented to person, place, and time.     Gait: Gait is intact.  Psychiatric:        Mood and Affect: Mood and affect normal.      No results found for any visits on 12/27/23.     Assessment & Plan:    Routine Health Maintenance and Physical Exam  Immunization History  Administered Date(s) Administered   Tdap 08/31/2019    Health Maintenance  Topic Date Due   Pneumococcal Vaccine: 50+ Years (1 of 2 - PCV) Never done   Hepatitis B Vaccines 19-59 Average Risk (1 of 3 - 19+ 3-dose series) Never done   Lung Cancer Screening  01/05/2020   COVID-19 Vaccine (1 - 2025-26 season) Never done   Zoster Vaccines- Shingrix (1 of 2) 03/28/2024 (Originally 06/28/2017)    Fecal DNA (Cologuard)  12/16/2025   DTaP/Tdap/Td (2 - Td or Tdap) 08/30/2029   Hepatitis C Screening  Completed   HIV Screening  Completed   HPV VACCINES  Aged Out   Meningococcal B Vaccine  Aged Out   Influenza Vaccine  Discontinued    Discussed health benefits of physical activity, and encouraged him to engage in regular exercise appropriate for his age and  condition.  Personal history of tobacco use, presenting hazards to health -     CT CHEST LUNG CANCER SCREENING LOW DOSE WO CONTRAST; Future  Routine general medical examination at a health care facility  General physical exam findings are normal today. I reviewed the patient's preventative testing, immunizations, and lifestyle habits. I made appropriate recommendations and placed orders for the appropriate tests and/or vaccinations. I counseled the patient on the CDC's recommendations for healthy exercise and diet. I counseled the patient on healthy sleep habits and stress management. Handouts to reinforce the counseling were given at the conclusion of the visit.    Return in 6 months (on 06/25/2024).     Heron CHRISTELLA Sharper, MD

## 2024-01-06 ENCOUNTER — Ambulatory Visit
Admission: RE | Admit: 2024-01-06 | Discharge: 2024-01-06 | Disposition: A | Discharge status: Home / Self Care | Source: Ambulatory Visit | Attending: Family Medicine | Admitting: Family Medicine

## 2024-01-06 DIAGNOSIS — Z87891 Personal history of nicotine dependence: Secondary | ICD-10-CM

## 2024-01-13 ENCOUNTER — Ambulatory Visit: Payer: Self-pay | Admitting: Family Medicine

## 2024-02-24 ENCOUNTER — Ambulatory Visit (HOSPITAL_COMMUNITY)
Admission: RE | Admit: 2024-02-24 | Discharge: 2024-02-24 | Disposition: A | Discharge status: Home / Self Care | Source: Ambulatory Visit | Attending: Internal Medicine | Admitting: Internal Medicine

## 2024-02-24 DIAGNOSIS — I5022 Chronic systolic (congestive) heart failure: Secondary | ICD-10-CM

## 2024-02-29 ENCOUNTER — Other Ambulatory Visit: Payer: Self-pay | Admitting: Family Medicine

## 2024-02-29 DIAGNOSIS — F419 Anxiety disorder, unspecified: Secondary | ICD-10-CM

## 2024-03-21 ENCOUNTER — Other Ambulatory Visit: Payer: Self-pay | Admitting: Family Medicine

## 2024-03-21 DIAGNOSIS — N401 Enlarged prostate with lower urinary tract symptoms: Secondary | ICD-10-CM

## 2024-03-23 ENCOUNTER — Other Ambulatory Visit: Payer: Self-pay | Admitting: Family Medicine

## 2024-03-23 ENCOUNTER — Encounter: Payer: Self-pay | Admitting: Family Medicine

## 2024-03-23 DIAGNOSIS — F419 Anxiety disorder, unspecified: Secondary | ICD-10-CM

## 2024-06-22 ENCOUNTER — Ambulatory Visit (HOSPITAL_COMMUNITY)
